# Patient Record
Sex: Female | Born: 1985 | Race: White | Hispanic: No | Marital: Single | Smoking: Never smoker
Health system: Southern US, Community
[De-identification: ages and names within clinical notes are randomized; demographics above are authoritative.]

## PROBLEM LIST (undated history)

## (undated) ENCOUNTER — Inpatient Hospital Stay (HOSPITAL_COMMUNITY): Payer: Self-pay

## (undated) ENCOUNTER — Emergency Department (HOSPITAL_COMMUNITY): Admission: EM | Source: Home / Self Care

## (undated) DIAGNOSIS — F329 Major depressive disorder, single episode, unspecified: Secondary | ICD-10-CM

## (undated) DIAGNOSIS — K37 Unspecified appendicitis: Secondary | ICD-10-CM

## (undated) DIAGNOSIS — H6690 Otitis media, unspecified, unspecified ear: Secondary | ICD-10-CM

## (undated) DIAGNOSIS — F988 Other specified behavioral and emotional disorders with onset usually occurring in childhood and adolescence: Secondary | ICD-10-CM

## (undated) DIAGNOSIS — F32A Depression, unspecified: Secondary | ICD-10-CM

## (undated) DIAGNOSIS — G709 Myoneural disorder, unspecified: Secondary | ICD-10-CM

## (undated) DIAGNOSIS — O139 Gestational [pregnancy-induced] hypertension without significant proteinuria, unspecified trimester: Secondary | ICD-10-CM

## (undated) DIAGNOSIS — F909 Attention-deficit hyperactivity disorder, unspecified type: Secondary | ICD-10-CM

## (undated) DIAGNOSIS — O1493 Unspecified pre-eclampsia, third trimester: Secondary | ICD-10-CM

## (undated) DIAGNOSIS — Z8619 Personal history of other infectious and parasitic diseases: Secondary | ICD-10-CM

## (undated) DIAGNOSIS — H65115 Acute and subacute allergic otitis media (mucoid) (sanguinous) (serous), recurrent, left ear: Secondary | ICD-10-CM

## (undated) DIAGNOSIS — F419 Anxiety disorder, unspecified: Secondary | ICD-10-CM

## (undated) DIAGNOSIS — O365990 Maternal care for other known or suspected poor fetal growth, unspecified trimester, not applicable or unspecified: Secondary | ICD-10-CM

## (undated) HISTORY — DX: Personal history of other infectious and parasitic diseases: Z86.19

## (undated) HISTORY — DX: Major depressive disorder, single episode, unspecified: F32.9

## (undated) HISTORY — DX: Anxiety disorder, unspecified: F41.9

## (undated) HISTORY — PX: TYMPANOPLASTY: SHX33

## (undated) HISTORY — DX: Maternal care for other known or suspected poor fetal growth, unspecified trimester, not applicable or unspecified: O36.5990

## (undated) HISTORY — PX: TONSILLECTOMY: SUR1361

## (undated) HISTORY — DX: Acute and subacute allergic otitis media (mucoid) (sanguinous) (serous), recurrent, left ear: H65.115

## (undated) HISTORY — DX: Otitis media, unspecified, unspecified ear: H66.90

## (undated) HISTORY — DX: Myoneural disorder, unspecified: G70.9

## (undated) HISTORY — PX: WISDOM TOOTH EXTRACTION: SHX21

## (undated) HISTORY — DX: Attention-deficit hyperactivity disorder, unspecified type: F90.9

## (undated) HISTORY — DX: Depression, unspecified: F32.A

## (undated) HISTORY — DX: Unspecified pre-eclampsia, third trimester: O14.93

## (undated) HISTORY — DX: Other specified behavioral and emotional disorders with onset usually occurring in childhood and adolescence: F98.8

## (undated) HISTORY — DX: Unspecified appendicitis: K37

---

## 2006-08-04 ENCOUNTER — Emergency Department: Payer: Self-pay | Admitting: Emergency Medicine

## 2009-02-18 ENCOUNTER — Ambulatory Visit: Payer: Self-pay | Admitting: Unknown Physician Specialty

## 2010-02-13 ENCOUNTER — Emergency Department: Payer: Self-pay | Admitting: Emergency Medicine

## 2010-05-12 ENCOUNTER — Ambulatory Visit: Payer: Self-pay | Admitting: Internal Medicine

## 2011-01-17 ENCOUNTER — Encounter: Payer: Self-pay | Admitting: Internal Medicine

## 2011-01-17 ENCOUNTER — Ambulatory Visit (INDEPENDENT_AMBULATORY_CARE_PROVIDER_SITE_OTHER): Payer: PRIVATE HEALTH INSURANCE | Admitting: Internal Medicine

## 2011-01-17 ENCOUNTER — Ambulatory Visit: Payer: Self-pay | Admitting: Internal Medicine

## 2011-01-17 VITALS — BP 112/76 | HR 86 | Temp 98.1°F | Resp 14 | Ht 67.0 in | Wt 176.0 lb

## 2011-01-17 DIAGNOSIS — IMO0001 Reserved for inherently not codable concepts without codable children: Secondary | ICD-10-CM

## 2011-01-17 DIAGNOSIS — Z309 Encounter for contraceptive management, unspecified: Secondary | ICD-10-CM

## 2011-01-17 DIAGNOSIS — H6690 Otitis media, unspecified, unspecified ear: Secondary | ICD-10-CM

## 2011-01-17 MED ORDER — DROSPIRENONE-ETHINYL ESTRADIOL 3-0.02 MG PO TABS: 1.0000 | ORAL_TABLET | Freq: Every day | ORAL | Status: DC

## 2011-01-17 MED ORDER — LEVONORGESTREL 0.75 MG PO TABS: 0.7500 mg | ORAL_TABLET | Freq: Two times a day (BID) | ORAL | Status: DC

## 2011-01-17 MED ORDER — LEVOFLOXACIN 750 MG PO TABS: 750.0000 mg | ORAL_TABLET | Freq: Every day | ORAL | Status: AC

## 2011-01-17 NOTE — Progress Notes (Signed)
Subjective:    Patient ID: Allison Moreno, female    DOB: [birth year], 25 y.o.   MRN: [id number]  HPI [name] is a 25 year old female who presents for an acute visit complaining of bilateral ear pain. She notes this developed a couple of weeks ago. She was seen at urgent care and started on Augmentin. She completed a ten-day course of Augmentin with no improvement. She denies any fever or chills. She denies any change in hearing. She notes a history of chronic ear infections as a child. She denies any drainage from her ears. She denies any other symptoms such as congestion, sore throat, or cough.  She also reports that she has recently become sexually active with a new partner. She had a sexual encounter after which she realized she had forgotten her birth control pill. She was prescribed plan B. by a local urgent care. She notes that she needs a documented prescription of this for her insurance company. She reports now full compliance with her OCP and is using barrier protection.  Outpatient Encounter Prescriptions as of 01/17/2011  Medication Sig Dispense Refill  . drospirenone-ethinyl estradiol (YAZ) 3-0.02 MG tablet Take 1 tablet by mouth daily.  1 Package  11  . lamoTRIgine (LAMICTAL) 200 MG tablet Take 200 mg by mouth daily.        Marland Kitchen levofloxacin (LEVAQUIN) 750 MG tablet Take 1 tablet (750 mg total) by mouth daily.  7 tablet  0  . levonorgestrel (PLAN B,NEXT CHOICE) 0.75 MG tablet Take 1 tablet (0.75 mg total) by mouth every 12 (twelve) hours.  2 tablet  0     Review of Systems  Constitutional: Negative for fever, chills, appetite change, fatigue and unexpected weight change.  HENT: Positive for ear pain. Negative for hearing loss, congestion, sore throat, trouble swallowing, neck pain, voice change and sinus pressure.   Eyes: Negative for visual disturbance.  Respiratory: Negative for cough, shortness of breath, wheezing and stridor.   Cardiovascular: Negative for chest pain,  palpitations and leg swelling.  Gastrointestinal: Negative for nausea, vomiting, abdominal pain, diarrhea, constipation, blood in stool, abdominal distention and anal bleeding.  Genitourinary: Negative for dysuria and flank pain.  Musculoskeletal: Negative for myalgias, arthralgias and gait problem.  Skin: Negative for color change and rash.  Neurological: Negative for dizziness and headaches.  Hematological: Negative for adenopathy. Does not bruise/bleed easily.  Psychiatric/Behavioral: Negative for suicidal ideas, sleep disturbance and dysphoric mood. The patient is not nervous/anxious.    BP 112/76  Pulse 86  Temp 98.1 F (36.7 C)  Resp 14  Ht 5\' 7"  (1.702 m)  Wt 176 lb (79.833 kg)  BMI 27.57 kg/m2  SpO2 99%  LMP 01/01/2011     Objective:   Physical Exam  Constitutional: She is oriented to person, place, and time. She appears well-developed and well-nourished. No distress.  HENT:  Head: Normocephalic and atraumatic.  Right Ear: External ear normal. Tympanic membrane is retracted. A middle ear effusion is present.  Left Ear: External ear normal. Tympanic membrane is retracted. A middle ear effusion is present.  Ears:  Nose: Nose normal.  Mouth/Throat: Oropharynx is clear and moist. No oropharyngeal exudate or posterior oropharyngeal erythema.  Eyes: Conjunctivae are normal. Pupils are equal, round, and reactive to light. Right eye exhibits no discharge. Left eye exhibits no discharge. No scleral icterus.  Neck: Normal range of motion. Neck supple. No tracheal deviation present. No thyromegaly present.  Cardiovascular: Normal rate, regular rhythm, normal heart sounds and intact distal pulses.  Exam reveals no gallop and no friction rub.   No murmur heard. Pulmonary/Chest: Effort normal and breath sounds normal. No respiratory distress. She has no wheezes. She has no rales. She exhibits no tenderness.  Musculoskeletal: Normal range of motion. She exhibits no edema and no  tenderness.  Lymphadenopathy:    She has no cervical adenopathy.  Neurological: She is alert and oriented to person, place, and time. No cranial nerve deficit. She exhibits normal muscle tone. Coordination normal.  Skin: Skin is warm and dry. No rash noted. She is not diaphoretic. No erythema. No pallor.  Psychiatric: She has a normal mood and affect. Her behavior is normal. Judgment and thought content normal.          Assessment & Plan:  1. Otitis media - patient with left otitis media. She has purulent effusions behind both tympanic membranes. She reports a history of chronic ear infections as a child. She was treated recently with Augmentin. Will try a course of Levaquin. She will followup in one week. If no improvement will set her up with ear nose and throat.  2. Contraception - Will continue Yaz. Rx given for Plan B for pt documentation of use last month.

## 2011-01-25 ENCOUNTER — Ambulatory Visit: Payer: PRIVATE HEALTH INSURANCE | Admitting: Internal Medicine

## 2011-05-16 ENCOUNTER — Other Ambulatory Visit: Payer: Self-pay

## 2011-05-16 LAB — VALPROIC ACID LEVEL: Valproic Acid: 59 ug/mL

## 2012-01-14 ENCOUNTER — Telehealth: Payer: Self-pay | Admitting: Internal Medicine

## 2012-01-14 ENCOUNTER — Emergency Department: Payer: Self-pay | Admitting: Emergency Medicine

## 2012-01-14 LAB — URINALYSIS, COMPLETE
Nitrite: NEGATIVE
Protein: NEGATIVE
Specific Gravity: 1.005 (ref 1.003–1.030)
WBC UR: 179 /HPF (ref 0–5)

## 2012-01-14 NOTE — Telephone Encounter (Signed)
Left message on cell phone voicemail advising patient that she needs to be seen for an acute visit, if symptoms worsen or change over night she should go to acute care or ED.

## 2012-01-14 NOTE — Telephone Encounter (Signed)
Caller: Laporchia/Patient; Patient Name: Allison Moreno; PCP: [facility] (Adults only); Best Callback Phone Number: [phone]; Call regarding Urinary burning, frequency and streaks of blood, onset 10-10. All emergent symptoms ruled out per Bloody urine protocol, see in 24 hrs due to one or more UTI symptoms has not been evaluated.  No appts on 10-15.  Last office visit was 01-2011.  Pt would like script called into  [facility], [phone]. Please follow up with Pt w/ appt or if MD will call RX in.

## 2012-01-15 LAB — URINE CULTURE

## 2012-08-03 ENCOUNTER — Emergency Department: Payer: Self-pay | Admitting: Emergency Medicine

## 2013-02-02 ENCOUNTER — Emergency Department: Payer: Self-pay | Admitting: Emergency Medicine

## 2013-02-02 LAB — COMPREHENSIVE METABOLIC PANEL
Albumin: 4.2 g/dL (ref 3.4–5.0)
Alkaline Phosphatase: 76 U/L (ref 50–136)
BUN: 20 mg/dL — ABNORMAL HIGH (ref 7–18)
Bilirubin,Total: 0.3 mg/dL (ref 0.2–1.0)
Calcium, Total: 9.4 mg/dL (ref 8.5–10.1)
Chloride: 107 mmol/L (ref 98–107)
Co2: 28 mmol/L (ref 21–32)
Glucose: 93 mg/dL (ref 65–99)
Osmolality: 274 (ref 275–301)
Potassium: 3.4 mmol/L — ABNORMAL LOW (ref 3.5–5.1)
Total Protein: 7.8 g/dL (ref 6.4–8.2)

## 2013-02-02 LAB — CBC
MCHC: 33.9 g/dL (ref 32.0–36.0)
MCV: 86 fL (ref 80–100)
Platelet: 159 10*3/uL (ref 150–440)

## 2013-02-02 LAB — DRUG SCREEN, URINE
Amphetamines, Ur Screen: NEGATIVE (ref ?–1000)
Barbiturates, Ur Screen: NEGATIVE (ref ?–200)
Cocaine Metabolite,Ur ~~LOC~~: NEGATIVE (ref ?–300)
MDMA (Ecstasy)Ur Screen: NEGATIVE (ref ?–500)
Methadone, Ur Screen: NEGATIVE (ref ?–300)
Phencyclidine (PCP) Ur S: NEGATIVE (ref ?–25)

## 2013-02-02 LAB — URINALYSIS, COMPLETE
Bilirubin,UR: NEGATIVE
Glucose,UR: NEGATIVE mg/dL (ref 0–75)
Ketone: NEGATIVE
RBC,UR: 4 /HPF (ref 0–5)
Specific Gravity: 1.028 (ref 1.003–1.030)

## 2013-02-02 LAB — ETHANOL
Ethanol %: <0.003 % (ref 0.000–0.080)
Ethanol: <3 mg/dL

## 2013-02-04 LAB — URINE CULTURE

## 2014-03-02 ENCOUNTER — Ambulatory Visit: Payer: Self-pay | Admitting: Family Medicine

## 2014-09-23 ENCOUNTER — Encounter: Payer: Self-pay | Admitting: Obstetrics and Gynecology

## 2014-12-21 ENCOUNTER — Encounter: Payer: Self-pay | Admitting: Family Medicine

## 2014-12-21 ENCOUNTER — Ambulatory Visit (INDEPENDENT_AMBULATORY_CARE_PROVIDER_SITE_OTHER): Payer: 59 | Admitting: Family Medicine

## 2014-12-21 ENCOUNTER — Other Ambulatory Visit: Payer: Self-pay | Admitting: Family Medicine

## 2014-12-21 VITALS — BP 116/82 | HR 88 | Temp 98.0°F | Resp 16 | Wt 199.8 lb

## 2014-12-21 DIAGNOSIS — F988 Other specified behavioral and emotional disorders with onset usually occurring in childhood and adolescence: Secondary | ICD-10-CM | POA: Insufficient documentation

## 2014-12-21 DIAGNOSIS — Z8619 Personal history of other infectious and parasitic diseases: Secondary | ICD-10-CM

## 2014-12-21 DIAGNOSIS — H65115 Acute and subacute allergic otitis media (mucoid) (sanguinous) (serous), recurrent, left ear: Secondary | ICD-10-CM

## 2014-12-21 DIAGNOSIS — F900 Attention-deficit hyperactivity disorder, predominantly inattentive type: Secondary | ICD-10-CM

## 2014-12-21 DIAGNOSIS — F4322 Adjustment disorder with anxiety: Secondary | ICD-10-CM | POA: Insufficient documentation

## 2014-12-21 DIAGNOSIS — Z30013 Encounter for initial prescription of injectable contraceptive: Secondary | ICD-10-CM | POA: Diagnosis not present

## 2014-12-21 HISTORY — DX: Personal history of other infectious and parasitic diseases: Z86.19

## 2014-12-21 HISTORY — DX: Acute and subacute allergic otitis media (mucoid) (sanguinous) (serous), recurrent, left ear: H65.115

## 2014-12-21 MED ORDER — AMPHETAMINE-DEXTROAMPHETAMINE 10 MG PO TABS: 10.0000 mg | ORAL_TABLET | Freq: Two times a day (BID) | ORAL | Status: DC

## 2014-12-21 MED ORDER — AMOXICILLIN-POT CLAVULANATE 875-125 MG PO TABS
1.0000 | ORAL_TABLET | Freq: Two times a day (BID) | ORAL | Status: DC
Start: 2014-12-21 — End: 2015-06-28

## 2014-12-21 MED ORDER — AMPHETAMINE-DEXTROAMPHET ER 10 MG PO CP24: 10.0000 mg | ORAL_CAPSULE | Freq: Every day | ORAL | Status: DC

## 2014-12-21 MED ORDER — MEDROXYPROGESTERONE ACETATE 150 MG/ML IM SUSP: 150.0000 mg | Freq: Once | INTRAMUSCULAR | Status: DC

## 2014-12-21 NOTE — Progress Notes (Deleted)
Name: Allison Moreno   MRN: [id number]    DOB: 1985/10/25   Date:12/21/2014       Progress Note  Subjective  Chief Complaint  Chief Complaint  Patient presents with  . Otitis Media    HPI  Patient is here today with concerns regarding the following symptoms {Symptoms; uri:5322} that started {gen duration 2:310041} ago.  Associated with {SYMPTOMS; CONSTITUTIONAL:22989}. Not associated with ***. Has tried the following home remedies: ***  No problem-specific assessment & plan notes found for this encounter.   Past Medical History  Diagnosis Date  . ADD (attention deficit disorder)   . Depression     Social History  Substance Use Topics  . Smoking status: Never Smoker   . Smokeless tobacco: Not on file  . Alcohol Use: No     Current outpatient prescriptions:  .  amphetamine-dextroamphetamine (ADDERALL) 7.5 MG tablet, Take 7.5 mg by mouth daily., Disp: , Rfl:   Allergies  Allergen Reactions  . Sulfa Drugs Cross Reactors     ROS  Positive for fatigue, nasal congestion, sinus pressure, ear fullness, cough as mentioned in HPI, otherwise all systems reviewed and are negative.  Objective  Filed Vitals:   12/21/14 1558  BP: 116/82  Pulse: 88  Temp: 98 F (36.7 C)  TempSrc: Oral  Resp: 16  Weight: 199 lb 12.8 oz (90.629 kg)  SpO2: 98%   Body mass index is 31.29 kg/(m^2).   Physical Exam  Constitutional: Patient appears well-developed and well-nourished. In no acute distress but does appear to be fatigued from acute illness. HEENT:  - Head: Normocephalic and atraumatic.  - Ears: RIGHT TM bulging with minimal clear exudate, LEFT TM bulging with minimal clear exudate.  - Nose: Nasal mucosa boggy and congested.  - Mouth/Throat: Oropharynx is moist with slight erythema of bilateral tonsils without hypertrophy or exudates. Post nasal drainage present.  - Eyes: Conjunctivae clear, EOM movements normal. PERRLA. No scleral icterus.  Neck: Normal range of motion. Neck  supple. No JVD present. No thyromegaly present. No local lymphadenopathy. Cardiovascular: Regular rate, regular rhythm with no murmurs heard.  Pulmonary/Chest: Effort normal and breath sounds clear in all lung fields.  Musculoskeletal: Normal range of motion bilateral UE and LE, no joint effusions. Skin: Skin is warm and dry. No rash noted. Psychiatric: Patient has a normal mood and affect. Behavior is normal in office today. Judgment and thought content normal in office today.   Assessment & Plan  Etiologies include allergic rhinitis, viral or bacterial infection. Instructed patient on increasing hydration, nasal saline spray, steam inhalation, NSAID if tolerated and not contraindicated. If not already doing so start taking daily anti-histamine and use a steroid nasal spray. If symptoms persist/worsen may consider antibiotic therapy.

## 2014-12-21 NOTE — Progress Notes (Signed)
Name: Allison Moreno   MRN: [id number]    DOB: Mar 08, 1986   Date:12/21/2014       Progress Note  Subjective  Chief Complaint  Chief Complaint  Patient presents with  . Otitis Media  . Contraception  . ADHD    HPI  Allison Moreno is a 29 year old female here today with acute concerns as well as here for chronic medical conditions. Patient presents with left ear pain.  Symptoms include left ear pain. Symptoms began 3 days ago and are gradually worsening since that time. Patient denies nasal congestion and sore throat. Ear history: several previous ear infections. History of right ear surgery due to several infections during child hood. Allison Moreno also reports having had a Mirena IUD placed on 11/29/14 and by 12/11/14 started having headaches, mood swings so she went to the ER and had her IUD removed on 12/14/14.  Symptoms have since resolved. She is now requesting to return to Depo Provera for birth control. Also here for follow up of ADD: The condition is characterized as fidgeting, loses items necessary for activity, interrupting others, poor attention span, shifting from one uncompleted task to another and easily distracted but not excessive talking, difficulty remaining seated, difficulty waiting for her turn, engages in physically dangerous activities, difficulty waiting for her turn, blurting out answers before question is complete, difficulty following instructions or antisocial behavior. There has been no associated hearing difficulties, vision disturbances, chest pain, change in appetite, worsening mood. She currently uses Adderall 7.5 mg po bid and reports no side effects from this medication but feels that the dose may not be enough.   Active Ambulatory Problems    Diagnosis Date Noted  . Adjustment disorder with anxious mood 12/21/2014  . 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 Recurrent subacute  allergic otitis media of left ear 12/21/2014  . Encounter for initial prescription of injectable contraceptive 12/21/2014   Resolved Ambulatory Problems    Diagnosis Date Noted  . No Resolved Ambulatory Problems   Past Medical History  Diagnosis Date  . ADD (attention deficit disorder)   . Depression   . Chronic ear infection     Social History  Substance Use Topics  . Smoking status: Never Smoker   . Smokeless tobacco: Not on file  . Alcohol Use: No    Current outpatient prescriptions:  .  amphetamine-dextroamphetamine (ADDERALL) 7.5 MG tablet, Take 7.5 mg by mouth daily., Disp: , Rfl:   Past Surgical History  Procedure Laterality Date  . Tympanoplasty Right     Family History  Problem Relation Age of Onset  . Depression Mother     Allergies  Allergen Reactions  . Sulfa Drugs Cross Reactors      Review of Systems  CONSTITUTIONAL: No significant weight changes, fever, chills, weakness or fatigue.  HEENT:  - Eyes: No visual changes.  - Ears: No auditory changes. Left ear pain.  - Nose: No sneezing, congestion, runny nose. - Throat: Yes sore throat. No changes in swallowing. SKIN: No rash or itching.  CARDIOVASCULAR: No chest pain, chest pressure or chest discomfort. No palpitations or edema.  RESPIRATORY: No shortness of breath, cough or sputum.  GASTROINTESTINAL: No anorexia, nausea, vomiting. No changes in bowel habits. No abdominal pain or blood.  GENITOURINARY: No dysuria. No frequency. No discharge.  NEUROLOGICAL: No headache, dizziness, syncope, paralysis, ataxia, numbness or tingling in the extremities. No memory changes. No change in  bowel or bladder control.  MUSCULOSKELETAL: No joint pain. No muscle pain. HEMATOLOGIC: No anemia, bleeding or bruising.  LYMPHATICS: No enlarged lymph nodes.  PSYCHIATRIC: No change in mood. No change in sleep pattern.  ENDOCRINOLOGIC: No reports of sweating, cold or heat intolerance. No polyuria or polydipsia.      Objective  BP 116/82 mmHg  Pulse 88  Temp(Src) 98 F (36.7 C) (Oral)  Resp 16  Wt 199 lb 12.8 oz (90.629 kg)  SpO2 98%  LMP 12/16/2014 (Exact Date) Body mass index is 31.29 kg/(m^2).  Physical Exam  Constitutional: Patient appears well-developed and well-nourished. In no distress.  HEENT:  - Head: Normocephalic and atraumatic.  - Ears: Right TM scarred without erythema. Left bulging with clear fluid and prominent red vasculature. - Nose: Nasal mucosa moist, boggy, congested. - Mouth/Throat: Oropharynx is clear and moist. No tonsillar hypertrophy or erythema. No post nasal drainage.  - Eyes: Conjunctivae clear, EOM movements normal. PERRLA. No scleral icterus.  Neck: Normal range of motion. Neck supple. No JVD present. No thyromegaly present.  Cardiovascular: Normal rate, regular rhythm and normal heart sounds.  No murmur heard.  Pulmonary/Chest: Effort normal and breath sounds normal. No respiratory distress. Musculoskeletal: Normal range of motion bilateral UE and LE, no joint effusions. Peripheral vascular: Bilateral LE no edema. Neurological: CN II-XII grossly intact with no focal deficits. Alert and oriented to person, place, and time. Coordination, balance, strength, speech and gait are normal.  Skin: Skin is warm and dry. No rash noted. No erythema.  Psychiatric: Patient has a stable mood and affect. Behavior is normal in office today. Judgment and thought content normal in office today.   Assessment & Plan  1. ADD (attention deficit disorder) without hyperactivity Plan to increase Adderall short acting to 10 mg dose for 1 month then switch to long acting formulation. The patient has been counseled on the proper use, side effects and potential interactions of the new medication. Patient encouraged to review the side effects and safety profile pamphlet provided with the prescription from the pharmacy as well as request counseling from the pharmacy team as needed.    -  amphetamine-dextroamphetamine (ADDERALL) 10 MG tablet; Take 1 tablet (10 mg total) by mouth 2 (two) times daily.  Dispense: 60 tablet; Refill: 0 - amphetamine-dextroamphetamine (ADDERALL XR) 10 MG 24 hr capsule; Take 1 capsule (10 mg total) by mouth daily.  Dispense: 30 capsule; Refill: 0 - amphetamine-dextroamphetamine (ADDERALL XR) 10 MG 24 hr capsule; Take 1 capsule (10 mg total) by mouth daily.  Dispense: 30 capsule; Refill: 0  2. Recurrent subacute allergic otitis media of left ear Acute problem. Treat with Augmentin. May use NSAIDs or tylenol as tolerated for pain and or fever.  - amoxicillin-clavulanate (AUGMENTIN) 875-125 MG per tablet; Take 1 tablet by mouth 2 (two) times daily.  Dispense: 20 tablet; Refill: 0  3. Encounter for initial prescription of injectable contraceptive Patient has been counseled on birth control options today. We discussed risks and benefits of each available contraception. Counseled on risk for STDs not reduced by birth control use. Decided on injectable contraception. The patient has been counseled on the proper usage, side effects and potential interactions of the new medication.  - medroxyPROGESTERone (DEPO-PROVERA) 150 MG/ML injection; Inject 1 mL (150 mg total) into the muscle once.  Dispense: 1 mL; Refill: 3

## 2014-12-29 ENCOUNTER — Telehealth: Payer: Self-pay

## 2014-12-29 NOTE — Telephone Encounter (Signed)
Patient called requesting that we fax proof that she has a rx for Adderall . She is in school and they had to do a drug test.  The requested information was printed and faxed to the attn of Dr. Ardyth Gal at [phone].  Confirmation was received.

## 2015-02-04 ENCOUNTER — Ambulatory Visit: Payer: PRIVATE HEALTH INSURANCE | Admitting: Licensed Clinical Social Worker

## 2015-02-10 ENCOUNTER — Ambulatory Visit (INDEPENDENT_AMBULATORY_CARE_PROVIDER_SITE_OTHER): Payer: 59

## 2015-02-10 DIAGNOSIS — Z3049 Encounter for surveillance of other contraceptives: Secondary | ICD-10-CM

## 2015-02-10 DIAGNOSIS — Z3042 Encounter for surveillance of injectable contraceptive: Secondary | ICD-10-CM

## 2015-02-10 LAB — POCT URINE PREGNANCY: PREG TEST UR: NEGATIVE

## 2015-02-10 MED ORDER — MEDROXYPROGESTERONE ACETATE 150 MG/ML IM SUSP
150.0000 mg | Freq: Once | INTRAMUSCULAR | Status: AC
  Administered 2015-02-10: 150 mg via INTRAMUSCULAR

## 2015-02-15 ENCOUNTER — Ambulatory Visit: Payer: 59 | Admitting: Family Medicine

## 2015-03-01 ENCOUNTER — Ambulatory Visit: Payer: 59 | Admitting: Family Medicine

## 2015-03-22 ENCOUNTER — Ambulatory Visit: Payer: 59 | Admitting: Family Medicine

## 2015-06-28 ENCOUNTER — Encounter: Payer: Self-pay | Admitting: *Deleted

## 2015-06-28 ENCOUNTER — Emergency Department
Admission: EM | Admit: 2015-06-28 | Discharge: 2015-06-28 | Disposition: A | Discharge status: Home / Self Care | Payer: Self-pay | Source: Home / Self Care | Attending: Family Medicine | Admitting: Family Medicine

## 2015-06-28 DIAGNOSIS — J029 Acute pharyngitis, unspecified: Secondary | ICD-10-CM

## 2015-06-28 DIAGNOSIS — J302 Other seasonal allergic rhinitis: Secondary | ICD-10-CM

## 2015-06-28 LAB — POCT RAPID STREP A (OFFICE): Rapid Strep A Screen: NEGATIVE

## 2015-06-28 MED ORDER — MONTELUKAST SODIUM 10 MG PO TABS: 10.0000 mg | ORAL_TABLET | Freq: Every day | ORAL | Status: DC

## 2015-06-28 NOTE — ED Provider Notes (Signed)
CSN: [id number]     Arrival date & time 06/28/15  1913 History   First MD Initiated Contact with Patient 06/28/15 2002     Chief Complaint  Patient presents with  . Sore Throat      HPI Comments: Patient has a history of seasonal allergies and has been coughing for about 1.5 weeks but has not felt ill.  Two days ago she developed a sore throat, fatigue, and increased sinus congestion.  No fevers, chills, and sweats. She has a past history of otitis media and ear tubes.  The history is provided by the patient.    Past Medical History  Diagnosis Date  . ADD (attention deficit disorder)   . Depression   . Chronic ear infection    Past Surgical History  Procedure Laterality Date  . Tympanoplasty Right   . Tonsillectomy    . Wisdom tooth extraction     Family History  Problem Relation Age of Onset  . Depression Mother   . Diabetes Father    Social History  Substance Use Topics  . Smoking status: Never Smoker   . Smokeless tobacco: None  . Alcohol Use: 0.0 oz/week    0 Standard drinks or equivalent per week   OB History    No data available     Review of Systems + sore throat + cough No pleuritic pain No wheezing + nasal congestion + post-nasal drainage No sinus pain/pressure No itchy/red eyes No earache No hemoptysis No SOB No fever/chills No nausea No vomiting No abdominal pain No diarrhea No urinary symptoms No skin rash + fatigue No myalgias No headache Used OTC meds without relief  Allergies  Sulfa drugs cross reactors  Home Medications   Prior to Admission medications   Medication Sig Start Date End Date Taking? Authorizing Provider  amphetamine-dextroamphetamine (ADDERALL) 10 MG tablet Take 1 tablet (10 mg total) by mouth 2 (two) times daily. 12/21/14   Edwena FeltyAshany Sundaram, MD  medroxyPROGESTERone (DEPO-PROVERA) 150 MG/ML injection Inject 1 mL (150 mg total) into the muscle once. 12/21/14   Edwena FeltyAshany Sundaram, MD  montelukast (SINGULAIR) 10 MG tablet  Take 1 tablet (10 mg total) by mouth at bedtime. 06/28/15   Lattie HawStephen A Niccolas Loeper, MD   Meds Ordered and Administered this Visit  Medications - No data to display  BP 132/94 mmHg  Pulse 81  Temp(Src) 98.9 F (37.2 C) (Oral)  Resp 18  Ht 5\' 7"  (1.702 m)  Wt 211 lb (95.709 kg)  BMI 33.04 kg/m2  SpO2 98%  LMP 05/23/2015 No data found.   Physical Exam Nursing notes and Vital Signs reviewed. Appearance:  Patient appears stated age, and in no acute distress Eyes:  Pupils are equal, round, and reactive to light and accomodation.  Extraocular movement is intact.  Conjunctivae are not inflamed  Ears:  Canals normal.  Tympanic membranes normal.  Nose:  Mildly congested turbinates.  No sinus tenderness.    Pharynx:  Normal Neck:  Supple.  Tender enlarged posterior/lateral nodes are palpated bilaterally  Lungs:  Clear to auscultation.  Breath sounds are equal.  Moving air well. Heart:  Regular rate and rhythm without murmurs, rubs, or gallops.  Abdomen:  Nontender without masses or hepatosplenomegaly.  Bowel sounds are present.  No CVA or flank tenderness.  Extremities:  No edema.  Skin:  No rash present.   ED Course  Procedures none    Labs Reviewed  POCT RAPID STREP A (OFFICE) negative     MDM  1. Seasonal allergies   2. Acute pharyngitis, unspecified etiology; suspect early viral URI     Suspect daily cough because of seasonal allergies; begin trial of Singulair. As cold symptoms develop, try the following: Take plain guaifenesin (  extended release tabs such as Mucinex) twice daily, with plenty of water, for cough and congestion.  May add Pseudoephedrine ( , one or two every 4 to 6 hours) for sinus congestion.  Get adequate rest.   May use Afrin nasal spray (or generic oxymetazoline) twice daily for about 5 days and then discontinue.  Also recommend using saline nasal spray several times daily and saline nasal irrigation (AYR is a common brand).  Use Flonase nasal spray each  morning after using Afrin nasal spray and saline nasal irrigation. Try warm salt water gargles for sore throat.  Stop all antihistamines for now, and other non-prescription cough/cold preparations. May take Ibuprofen , 4 tabs every 8 hours with food for chest/sternum discomfort, sore throat, headache, etc. May take Delsym Cough Suppressant at bedtime for nighttime cough.  Follow-up with family doctor if not improving about10 days.     Lattie Haw, MD 07/04/15 [phone]

## 2015-06-28 NOTE — Discharge Instructions (Signed)
As cold symptoms develop, try the following: Take plain guaifenesin (1200mg  extended release tabs such as Mucinex) twice daily, with plenty of water, for cough and congestion.  May add Pseudoephedrine (30mg , one or two every 4 to 6 hours) for sinus congestion.  Get adequate rest.   May use Afrin nasal spray (or generic oxymetazoline) twice daily for about 5 days and then discontinue.  Also recommend using saline nasal spray several times daily and saline nasal irrigation (AYR is a common brand).  Use Flonase nasal spray each morning after using Afrin nasal spray and saline nasal irrigation. Try warm salt water gargles for sore throat.  Stop all antihistamines for now, and other non-prescription cough/cold preparations. May take Ibuprofen 200mg , 4 tabs every 8 hours with food for chest/sternum discomfort, sore throat, headache, etc. May take Delsym Cough Suppressant at bedtime for nighttime cough.  Follow-up with family doctor if not improving about10 days.

## 2015-06-28 NOTE — ED Notes (Signed)
Pt c/o sore throat, "bumps" on throat and nonproductive cough x 2 days. Denies fever.

## 2016-03-22 ENCOUNTER — Emergency Department (HOSPITAL_COMMUNITY)
Admission: EM | Admit: 2016-03-22 | Discharge: 2016-03-22 | Disposition: A | Discharge status: Home / Self Care | Payer: Medicaid Other | Attending: Emergency Medicine | Admitting: Emergency Medicine

## 2016-03-22 ENCOUNTER — Encounter (HOSPITAL_COMMUNITY): Payer: Self-pay | Admitting: Nurse Practitioner

## 2016-03-22 DIAGNOSIS — Z331 Pregnant state, incidental: Secondary | ICD-10-CM | POA: Insufficient documentation

## 2016-03-22 DIAGNOSIS — Z3A01 Less than 8 weeks gestation of pregnancy: Secondary | ICD-10-CM

## 2016-03-22 DIAGNOSIS — R5383 Other fatigue: Secondary | ICD-10-CM | POA: Insufficient documentation

## 2016-03-22 LAB — URINALYSIS, ROUTINE W REFLEX MICROSCOPIC
BILIRUBIN URINE: NEGATIVE
Glucose, UA: NEGATIVE mg/dL
HGB URINE DIPSTICK: NEGATIVE
Ketones, ur: 15 mg/dL — AB
Leukocytes, UA: NEGATIVE
NITRITE: NEGATIVE
PH: 6 (ref 5.0–8.0)
Protein, ur: NEGATIVE mg/dL
SPECIFIC GRAVITY, URINE: 1.025 (ref 1.005–1.030)

## 2016-03-22 LAB — PREGNANCY, URINE: Preg Test, Ur: POSITIVE — AB

## 2016-03-22 NOTE — Discharge Instructions (Signed)
Follow up with GYN as soon as possible.

## 2016-03-22 NOTE — ED Provider Notes (Signed)
[facility]-EMERGENCY DEPT Provider Note   CSN: [id number] Arrival date & time: 03/22/16  1334  By signing my name below, I, Teofilo PodMatthew P. Jamison, attest that this documentation has been prepared under the direction and in the presence of Rolland PorterMark Roxene Alviar, MD . Electronically Signed: Teofilo PodMatthew P. Jamison, ED Scribe. 03/22/2016. 3:56 PM.    History   Chief Complaint Chief Complaint  Patient presents with  . Fatigue    The history is provided by the patient. No language interpreter was used.   HPI Comments:  Katherina MiresCrystal G Cunnington is a 30 y.o. female who presents to the Emergency Department complaining of general fatigue for 3 days. Pt complains of associated fever, chills, right flank pain, cough, rhinorrhea, diarrhea. Pt states that the flank pain is intermittent and has been present for 2 weeks. 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 LNMP was 02/13/16, has never pregnant, and she does not take birth contol. No alleviating factors noted. Pt denies urinary frequency, dysuria.   Past Medical History:  Diagnosis Date  . ADD (attention deficit disorder)   . Chronic ear infection   . Depression     Patient Active Problem List   Diagnosis Date Noted  . Adjustment disorder with anxious mood 12/21/2014  . 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 Encounter for initial prescription of injectable contraceptive 12/21/2014    Past Surgical History:  Procedure Laterality Date  . TONSILLECTOMY    . TYMPANOPLASTY Right   . WISDOM TOOTH EXTRACTION      OB History    No data available       Home Medications    Prior to Admission medications   Medication Sig Start Date End Date Taking? Authorizing Provider  amphetamine-dextroamphetamine (ADDERALL) 10 MG tablet Take 1 tablet (10 mg total) by mouth 2 (two) times daily.  12/21/14   Edwena FeltyAshany Sundaram, MD  medroxyPROGESTERone (DEPO-PROVERA) 150 MG/ML injection Inject 1 mL (150 mg total) into the muscle once. 12/21/14   Edwena FeltyAshany Sundaram, MD  montelukast (SINGULAIR) 10 MG tablet Take 1 tablet (10 mg total) by mouth at bedtime. 06/28/15   Lattie HawStephen A Beese, MD    Family History Family History  Problem Relation Age of Onset  . Depression Mother   . Diabetes Father     Social History Social History  Substance Use Topics  . Smoking status: Never Smoker  . Smokeless tobacco: Not on file  . Alcohol use 0.0 oz/week     Allergies   Sulfa drugs cross reactors   Review of Systems Review of Systems  Constitutional: Positive for chills, fatigue and fever. Negative for appetite change and diaphoresis.  HENT: Positive for rhinorrhea. Negative for mouth sores, sore throat and trouble swallowing.   Eyes: Negative for visual disturbance.  Respiratory: Positive for cough. Negative for chest tightness, shortness of breath and wheezing.   Cardiovascular: Negative for chest pain.  Gastrointestinal: Positive for diarrhea. Negative for abdominal distention, abdominal pain, nausea and vomiting.  Endocrine: Negative for polydipsia, polyphagia and polyuria.  Genitourinary: Positive for flank pain. Negative for dysuria, frequency and hematuria.  Musculoskeletal: Negative for gait problem.  Skin: Negative for color change, pallor and rash.  Neurological: Negative for dizziness, syncope, light-headedness and headaches.  Hematological: Does not bruise/bleed easily.  Psychiatric/Behavioral: Negative for behavioral problems and confusion.     Physical Exam Updated Vital  Signs BP 133/93 (BP Location: Left Arm)   Pulse 101   Temp 98.5 F (36.9 C) (Oral)   Resp 14   LMP 02/13/2016   SpO2 99%   Physical Exam  Constitutional: She is oriented to person, place, and time. She appears well-developed and well-nourished. No distress.  HENT:  Head: Normocephalic.  Eyes:  Conjunctivae are normal. Pupils are equal, round, and reactive to light. No scleral icterus.  Neck: Normal range of motion. Neck supple. No thyromegaly present.  Cardiovascular: Normal rate and regular rhythm.  Exam reveals no gallop and no friction rub.   No murmur heard. Pulmonary/Chest: Effort normal and breath sounds normal. No respiratory distress. She has no wheezes. She has no rales.  Abdominal: Soft. Bowel sounds are normal. She exhibits no distension. There is no tenderness. There is no rebound.  Indicates RUQ but no tenderness.  Musculoskeletal: Normal range of motion.  Neurological: She is alert and oriented to person, place, and time.  Skin: Skin is warm and dry. No rash noted.  Psychiatric: She has a normal mood and affect. Her behavior is normal.     ED Treatments / Results  DIAGNOSTIC STUDIES:  Oxygen Saturation is 99% on RA, normal by my interpretation.    COORDINATION OF CARE:  3:56 PM Discussed treatment plan with pt at bedside and pt agreed to plan.   Labs (all labs ordered are listed, but only abnormal results are displayed) Labs Reviewed - No data to display  EKG  EKG Interpretation None       Radiology No results found.  Procedures Procedures (including critical care time)  Medications Ordered in ED Medications - No data to display   Initial Impression / Assessment and Plan / ED Course  I have reviewed the triage vital signs and the nursing notes.  Pertinent labs & imaging results that were available during my care of the patient were reviewed by me and considered in my medical decision making (see chart for details).  Clinical Course     Patient made aware positive brings. She's had no pain. She's having no nausea. She's had no bleeding. We discussed "acting pregnant" including avoiding alcohol, tobacco, anti-inflammatories. Taking a prenatal vitamin and started prenatal care with an OB/GYN as soon as possible.  Final Clinical  Impressions(s) / ED Diagnoses   Final diagnoses:  None    New Prescriptions New Prescriptions   No medications on file      Rolland PorterMark Cesar Alf, MD 03/22/16 1700

## 2016-03-22 NOTE — ED Triage Notes (Signed)
Pt presents with c/o malaise. The malaise began 2 days ago. She reports chills, body aches, anorexia,  rhinorrhea, dry cough. She tried otc cold meds with no relief of symptoms

## 2016-03-25 ENCOUNTER — Inpatient Hospital Stay (HOSPITAL_COMMUNITY)
Admission: AD | Admit: 2016-03-25 | Discharge: 2016-03-25 | Disposition: A | Discharge status: Home / Self Care | Payer: Medicaid Other | Source: Ambulatory Visit | Attending: Obstetrics and Gynecology | Admitting: Obstetrics and Gynecology

## 2016-03-25 ENCOUNTER — Encounter (HOSPITAL_COMMUNITY): Payer: Self-pay

## 2016-03-25 ENCOUNTER — Inpatient Hospital Stay (HOSPITAL_COMMUNITY): Payer: Medicaid Other

## 2016-03-25 DIAGNOSIS — O99341 Other mental disorders complicating pregnancy, first trimester: Secondary | ICD-10-CM | POA: Insufficient documentation

## 2016-03-25 DIAGNOSIS — O26891 Other specified pregnancy related conditions, first trimester: Secondary | ICD-10-CM | POA: Diagnosis not present

## 2016-03-25 DIAGNOSIS — F988 Other specified behavioral and emotional disorders with onset usually occurring in childhood and adolescence: Secondary | ICD-10-CM | POA: Diagnosis not present

## 2016-03-25 DIAGNOSIS — R109 Unspecified abdominal pain: Secondary | ICD-10-CM | POA: Diagnosis present

## 2016-03-25 DIAGNOSIS — O209 Hemorrhage in early pregnancy, unspecified: Secondary | ICD-10-CM | POA: Diagnosis not present

## 2016-03-25 DIAGNOSIS — O4690 Antepartum hemorrhage, unspecified, unspecified trimester: Secondary | ICD-10-CM | POA: Insufficient documentation

## 2016-03-25 DIAGNOSIS — F329 Major depressive disorder, single episode, unspecified: Secondary | ICD-10-CM | POA: Insufficient documentation

## 2016-03-25 DIAGNOSIS — N939 Abnormal uterine and vaginal bleeding, unspecified: Secondary | ICD-10-CM

## 2016-03-25 LAB — URINALYSIS, ROUTINE W REFLEX MICROSCOPIC
BILIRUBIN URINE: NEGATIVE
Glucose, UA: NEGATIVE mg/dL
HGB URINE DIPSTICK: NEGATIVE
Ketones, ur: NEGATIVE mg/dL
Leukocytes, UA: NEGATIVE
Nitrite: NEGATIVE
PH: 7 (ref 5.0–8.0)
Protein, ur: NEGATIVE mg/dL
SPECIFIC GRAVITY, URINE: 1.02 (ref 1.005–1.030)

## 2016-03-25 LAB — WET PREP, GENITAL
Clue Cells Wet Prep HPF POC: NONE SEEN
SPERM: NONE SEEN
TRICH WET PREP: NONE SEEN
Yeast Wet Prep HPF POC: NONE SEEN

## 2016-03-25 LAB — CBC
HEMATOCRIT: 37.9 % (ref 36.0–46.0)
Hemoglobin: 12.9 g/dL (ref 12.0–15.0)
MCH: 29.4 pg (ref 26.0–34.0)
MCHC: 34 g/dL (ref 30.0–36.0)
MCV: 86.3 fL (ref 78.0–100.0)
PLATELETS: 192 10*3/uL (ref 150–400)
RBC: 4.39 MIL/uL (ref 3.87–5.11)
RDW: 12.8 % (ref 11.5–15.5)
WBC: 5.9 10*3/uL (ref 4.0–10.5)

## 2016-03-25 LAB — HCG, QUANTITATIVE, PREGNANCY: hCG, Beta Chain, Quant, S: 8442 m[IU]/mL — ABNORMAL HIGH (ref <5)

## 2016-03-25 NOTE — MAU Provider Note (Signed)
History   Patient Allison Moreno is a 30 year old G1P0 by uncertain LMP here with complaints of abdominal cramping since yesterday and spotting today. She recently found out she was pregnant and does not have any prenatal care yet.   CSN: [id number]  Arrival date and time: 03/25/16 2035   None     Chief Complaint  Patient presents with  . Abdominal Cramping  . Vaginal Bleeding   Abdominal Cramping  This is a new problem. The current episode started today. The onset quality is sudden. The problem has been unchanged. The pain is located in the suprapubic region. The pain is at a severity of 4/10. The quality of the pain is cramping. The abdominal pain does not radiate. Pertinent negatives include no anorexia, arthralgias, belching, constipation, diarrhea, fever, flatus, frequency, headaches, hematochezia, hematuria, melena, myalgias, nausea, vomiting or weight loss. The pain is aggravated by certain positions. The pain is relieved by nothing. The treatment provided no relief. There is no history of abdominal surgery.  Vaginal Bleeding  The patient's primary symptoms include vaginal bleeding. The patient's pertinent negatives include no genital itching, genital lesions, genital odor, genital rash, missed menses, pelvic pain or vaginal discharge. The current episode started today. The problem occurs intermittently. The problem has been unchanged. Associated symptoms include abdominal pain. Pertinent negatives include no anorexia, constipation, diarrhea, fever, frequency, headaches, hematuria, nausea or vomiting. The vaginal discharge was normal. The vaginal bleeding is spotting. She has not been passing clots. She has not been passing tissue. Nothing aggravates the symptoms. She has tried nothing for the symptoms. There is no history of an abdominal surgery, a Cesarean section, an ectopic pregnancy, endometriosis, a gynecological surgery, herpes simplex, menorrhagia, metrorrhagia, miscarriage,  ovarian cysts, perineal abscess, PID, an STD, a terminated pregnancy or vaginosis.  Abdominal Pain  Pertinent negatives include no anorexia, arthralgias, belching, constipation, diarrhea, fever, flatus, frequency, headaches, hematochezia, hematuria, melena, myalgias, nausea, vomiting or weight loss. There is no history of abdominal surgery.    OB History    Gravida Para Term Preterm AB Living   1             SAB TAB Ectopic Multiple Live Births                  Past Medical History:  Diagnosis Date  . ADD (attention deficit disorder)   . Chronic ear infection   . Depression     Past Surgical History:  Procedure Laterality Date  . TONSILLECTOMY    . TYMPANOPLASTY Right   . WISDOM TOOTH EXTRACTION      Family History  Problem Relation Age of Onset  . Depression Mother   . Diabetes Father     Social History  Substance Use Topics  . Smoking status: Never Smoker  . Smokeless tobacco: Never Used  . Alcohol use 0.0 oz/week    Allergies:  Allergies  Allergen Reactions  . Sulfa Drugs Cross Reactors     Prescriptions Prior to Admission  Medication Sig Dispense Refill Last Dose  . amphetamine-dextroamphetamine (ADDERALL) 10 MG tablet Take 1 tablet (10 mg total) by mouth 2 (two) times daily. 60 tablet 0   . medroxyPROGESTERone (DEPO-PROVERA) 150 MG/ML injection Inject 1 mL (150 mg total) into the muscle once. 1 mL 3   . montelukast (SINGULAIR) 10 MG tablet Take 1 tablet (10 mg total) by mouth at bedtime. 30 tablet 1     Review of Systems  Constitutional: Negative for fever and weight loss.  HENT: Negative.   Eyes: Negative.   Respiratory: Negative.   Cardiovascular: Negative.   Gastrointestinal: Positive for abdominal pain. Negative for anorexia, constipation, diarrhea, flatus, hematochezia, melena, nausea and vomiting.  Genitourinary: Positive for vaginal bleeding. Negative for frequency, hematuria, menorrhagia, missed menses, pelvic pain and vaginal discharge.   Musculoskeletal: Negative for arthralgias and myalgias.  Skin: Negative.   Neurological: Negative for headaches.  Psychiatric/Behavioral: Negative.    Physical Exam   Blood pressure 131/87, pulse 88, temperature 97.8 F (36.6 C), temperature source Oral, resp. rate 16, last menstrual period 02/13/2016.  Physical Exam  Constitutional: She is oriented to person, place, and time. She appears well-developed.  HENT:  Head: Normocephalic.  Neck: Normal range of motion.  Respiratory: Effort normal. No respiratory distress. She has no wheezes. She has no rales. She exhibits no tenderness.  GI: Soft. She exhibits no distension and no mass. There is no tenderness. There is no rebound and no guarding.  Genitourinary: Vagina normal and uterus normal. No vaginal discharge found.  Genitourinary Comments: External genitalia and vaginal walls normal appearing; pink with no discharge or lesions. No blood visualized in the vagina; cervix is pink and non-erythematous nor friable. No CMT.   Musculoskeletal: Normal range of motion.  Neurological: She is alert and oriented to person, place, and time.  Skin: Skin is warm and dry.    MAU Course  Procedures  MDM UA/wet prep: normal GC CT pending Lab work: CBC ABO: O pos Beta hcg: 8444 US: IUP with yolk sac at 5 weeks and 5 days. No subchorionic hemorrhage visualized.    Assessment and Plan   1. Vaginal bleeding in pregnancy, first trimester   2. Vaginal bleeding    1. SIUP with yolk sac at 5 weeks and 5 days.  2. Patient to be discharged with pregnancy medicaid letter and instructions to establish prenatal care with a health care provider 3. Reviewed when to return to MAU (worsening bleedng, cramping that is not relieved by comfort measures, passing clots or tissue)   Charlesetta GaribaldiKathryn Lorraine Kooistra CNM 03/25/2016, 9:22 PM

## 2016-03-25 NOTE — Discharge Instructions (Signed)
First Trimester of Pregnancy  The first trimester of pregnancy is from week 1 until the end of week 12 (months 1 through 3). A week after a sperm fertilizes an egg, the egg will implant on the wall of the uterus. This embryo will begin to develop into a baby. Genes from you and your partner are forming the baby. The female genes determine whether the baby is a boy or a girl. At 6-8 weeks, the eyes and face are formed, and the heartbeat can be seen on ultrasound. At the end of 12 weeks, all the baby's organs are formed.   Now that you are pregnant, you will want to do everything you can to have a healthy baby. Two of the most important things are to get good prenatal care and to follow your health care provider's instructions. Prenatal care is all the medical care you receive before the baby's birth. This care will help prevent, find, and treat any problems during the pregnancy and childbirth.  BODY CHANGES  Your body goes through many changes during pregnancy. The changes vary from woman to woman.   · You may gain or lose a couple of pounds at first.  · You may feel sick to your stomach (nauseous) and throw up (vomit). If the vomiting is uncontrollable, call your health care provider.  · You may tire easily.  · You may develop headaches that can be relieved by medicines approved by your health care provider.  · You may urinate more often. Painful urination may mean you have a bladder infection.  · You may develop heartburn as a result of your pregnancy.  · You may develop constipation because certain hormones are causing the muscles that push waste through your intestines to slow down.  · You may develop hemorrhoids or swollen, bulging veins (varicose veins).  · Your breasts may begin to grow larger and become tender. Your nipples may stick out more, and the tissue that surrounds them (areola) may become darker.  · Your gums may bleed and may be sensitive to brushing and flossing.   · Dark spots or blotches (chloasma, mask of pregnancy) may develop on your face. This will likely fade after the baby is born.  · Your menstrual periods will stop.  · You may have a loss of appetite.  · You may develop cravings for certain kinds of food.  · You may have changes in your emotions from day to day, such as being excited to be pregnant or being concerned that something may go wrong with the pregnancy and baby.  · You may have more vivid and strange dreams.  · You may have changes in your hair. These can include thickening of your hair, rapid growth, and changes in texture. Some women also have hair loss during or after pregnancy, or hair that feels dry or thin. Your hair will most likely return to normal after your baby is born.  WHAT TO EXPECT AT YOUR PRENATAL VISITS  During a routine prenatal visit:  · You will be weighed to make sure you and the baby are growing normally.  · Your blood pressure will be taken.  · Your abdomen will be measured to track your baby's growth.  · The fetal heartbeat will be listened to starting around week 10 or 12 of your pregnancy.  · Test results from any previous visits will be discussed.  Your health care provider may ask you:  · How you are feeling.  · If you   are feeling the baby move.  · If you have had any abnormal symptoms, such as leaking fluid, bleeding, severe headaches, or abdominal cramping.  · If you are using any tobacco products, including cigarettes, chewing tobacco, and electronic cigarettes.  · If you have any questions.  Other tests that may be performed during your first trimester include:  · Blood tests to find your blood type and to check for the presence of any previous infections. They will also be used to check for low iron levels (anemia) and Rh antibodies. Later in the pregnancy, blood tests for diabetes will be done along with other tests if problems develop.  · Urine tests to check for infections, diabetes, or protein in the urine.   · An ultrasound to confirm the proper growth and development of the baby.  · An amniocentesis to check for possible genetic problems.  · Fetal screens for spina bifida and Down syndrome.  · You may need other tests to make sure you and the baby are doing well.  · HIV (human immunodeficiency virus) testing. Routine prenatal testing includes screening for HIV, unless you choose not to have this test.  HOME CARE INSTRUCTIONS   Medicines  · Follow your health care provider's instructions regarding medicine use. Specific medicines may be either safe or unsafe to take during pregnancy.  · Take your prenatal vitamins as directed.  · If you develop constipation, try taking a stool softener if your health care provider approves.  Diet  · Eat regular, well-balanced meals. Choose a variety of foods, such as meat or vegetable-based protein, fish, milk and low-fat dairy products, vegetables, fruits, and whole grain breads and cereals. Your health care provider will help you determine the amount of weight gain that is right for you.  · Avoid raw meat and uncooked cheese. These carry germs that can cause birth defects in the baby.  · Eating four or five small meals rather than three large meals a day may help relieve nausea and vomiting. If you start to feel nauseous, eating a few soda crackers can be helpful. Drinking liquids between meals instead of during meals also seems to help nausea and vomiting.  · If you develop constipation, eat more high-fiber foods, such as fresh vegetables or fruit and whole grains. Drink enough fluids to keep your urine clear or pale yellow.  Activity and Exercise  · Exercise only as directed by your health care provider. Exercising will help you:    Control your weight.    Stay in shape.    Be prepared for labor and delivery.  · Experiencing pain or cramping in the lower abdomen or low back is a good sign that you should stop exercising. Check with your health care provider  before continuing normal exercises.  · Try to avoid standing for long periods of time. Move your legs often if you must stand in one place for a long time.  · Avoid heavy lifting.  · Wear low-heeled shoes, and practice good posture.  · You may continue to have sex unless your health care provider directs you otherwise.  Relief of Pain or Discomfort  · Wear a good support bra for breast tenderness.    · Take warm sitz baths to soothe any pain or discomfort caused by hemorrhoids. Use hemorrhoid cream if your health care provider approves.    · Rest with your legs elevated if you have leg cramps or low back pain.  · If you develop varicose veins in your   legs, wear support hose. Elevate your feet for 15 minutes, 3-4 times a day. Limit salt in your diet.  Prenatal Care  · Schedule your prenatal visits by the twelfth week of pregnancy. They are usually scheduled monthly at first, then more often in the last 2 months before delivery.  · Write down your questions. Take them to your prenatal visits.  · Keep all your prenatal visits as directed by your health care provider.  Safety  · Wear your seat belt at all times when driving.  · Make a list of emergency phone numbers, including numbers for family, friends, the hospital, and police and fire departments.  General Tips  · Ask your health care provider for a referral to a local prenatal education class. Begin classes no later than at the beginning of month 6 of your pregnancy.  · Ask for help if you have counseling or nutritional needs during pregnancy. Your health care provider can offer advice or refer you to specialists for help with various needs.  · Do not use hot tubs, steam rooms, or saunas.  · Do not douche or use tampons or scented sanitary pads.  · Do not cross your legs for long periods of time.  · Avoid cat litter boxes and soil used by cats. These carry germs that can cause birth defects in the baby and possibly loss of the fetus by miscarriage or stillbirth.   · Avoid all smoking, herbs, alcohol, and medicines not prescribed by your health care provider. Chemicals in these affect the formation and growth of the baby.  · Do not use any tobacco products, including cigarettes, chewing tobacco, and electronic cigarettes. If you need help quitting, ask your health care provider. You may receive counseling support and other resources to help you quit.  · Schedule a dentist appointment. At home, brush your teeth with a soft toothbrush and be gentle when you floss.  SEEK MEDICAL CARE IF:   · You have dizziness.  · You have mild pelvic cramps, pelvic pressure, or nagging pain in the abdominal area.  · You have persistent nausea, vomiting, or diarrhea.  · You have a bad smelling vaginal discharge.  · You have pain with urination.  · You notice increased swelling in your face, hands, legs, or ankles.  SEEK IMMEDIATE MEDICAL CARE IF:   · You have a fever.  · You are leaking fluid from your vagina.  · You have spotting or bleeding from your vagina.  · You have severe abdominal cramping or pain.  · You have rapid weight gain or loss.  · You vomit blood or material that looks like coffee grounds.  · You are exposed to German measles and have never had them.  · You are exposed to fifth disease or chickenpox.  · You develop a severe headache.  · You have shortness of breath.  · You have any kind of trauma, such as from a fall or a car accident.     This information is not intended to replace advice given to you by your health care provider. Make sure you discuss any questions you have with your health care provider.     Document Released: 03/13/2001 Document Revised: 04/09/2014 Document Reviewed: 01/27/2013  [employer]vier Interactive Patient Education ©2017 [employer]

## 2016-03-25 NOTE — MAU Note (Signed)
Pt [facility] Started having some constant cramping last night-rates 4/10. Began having some light spotting today. LMP: 02/12/2016 was only 2 days and lighter than normal and typically has periods that are 4-5 days.

## 2016-03-26 LAB — ABO/RH: ABO/RH(D): O POS

## 2016-03-27 LAB — GC/CHLAMYDIA PROBE AMP (~~LOC~~) NOT AT ARMC
Chlamydia: NEGATIVE
NEISSERIA GONORRHEA: NEGATIVE

## 2016-04-26 ENCOUNTER — Encounter: Payer: Medicaid Other | Admitting: Obstetrics and Gynecology

## 2016-05-03 ENCOUNTER — Other Ambulatory Visit (HOSPITAL_COMMUNITY)
Admission: RE | Admit: 2016-05-03 | Discharge: 2016-05-03 | Disposition: A | Discharge status: Home / Self Care | Payer: Medicaid Other | Source: Ambulatory Visit | Attending: Student | Admitting: Student

## 2016-05-03 ENCOUNTER — Encounter: Payer: Self-pay | Admitting: Student

## 2016-05-03 ENCOUNTER — Ambulatory Visit (INDEPENDENT_AMBULATORY_CARE_PROVIDER_SITE_OTHER): Payer: Medicaid Other | Admitting: Student

## 2016-05-03 VITALS — BP 134/88 | HR 87 | Wt 218.0 lb

## 2016-05-03 DIAGNOSIS — Z1151 Encounter for screening for human papillomavirus (HPV): Secondary | ICD-10-CM | POA: Insufficient documentation

## 2016-05-03 DIAGNOSIS — Z3491 Encounter for supervision of normal pregnancy, unspecified, first trimester: Secondary | ICD-10-CM | POA: Diagnosis not present

## 2016-05-03 DIAGNOSIS — Z283 Underimmunization status: Secondary | ICD-10-CM

## 2016-05-03 DIAGNOSIS — O09899 Supervision of other high risk pregnancies, unspecified trimester: Secondary | ICD-10-CM

## 2016-05-03 DIAGNOSIS — Z23 Encounter for immunization: Secondary | ICD-10-CM

## 2016-05-03 DIAGNOSIS — Z113 Encounter for screening for infections with a predominantly sexual mode of transmission: Secondary | ICD-10-CM | POA: Diagnosis present

## 2016-05-03 DIAGNOSIS — O9989 Other specified diseases and conditions complicating pregnancy, childbirth and the puerperium: Secondary | ICD-10-CM

## 2016-05-03 DIAGNOSIS — Z0282 Encounter for adoption services: Secondary | ICD-10-CM

## 2016-05-03 DIAGNOSIS — Z3490 Encounter for supervision of normal pregnancy, unspecified, unspecified trimester: Secondary | ICD-10-CM

## 2016-05-03 DIAGNOSIS — Z3689 Encounter for other specified antenatal screening: Secondary | ICD-10-CM | POA: Diagnosis not present

## 2016-05-03 DIAGNOSIS — Z01419 Encounter for gynecological examination (general) (routine) without abnormal findings: Secondary | ICD-10-CM | POA: Insufficient documentation

## 2016-05-03 DIAGNOSIS — O0990 Supervision of high risk pregnancy, unspecified, unspecified trimester: Secondary | ICD-10-CM | POA: Insufficient documentation

## 2016-05-03 NOTE — Progress Notes (Addendum)
  Subjective:    Allison Moreno is at 31 year old G1P0 being seen today for her first obstetrical visit.  This is not a planned pregnancy. She is at 9565w3d gestation. Her obstetrical history is significant for nothing. . Relationship with FOB: not involved. Patient does intend to breast feed. Pregnancy history fully reviewed.  Patient reports no complaints.  Review of Systems:   Review of Systems  Constitutional: Negative.   HENT: Negative.   Eyes: Negative.   Respiratory: Negative.   Cardiovascular: Negative.   Gastrointestinal: Negative.   Endocrine: Negative.   Genitourinary: Negative.   Musculoskeletal: Negative.   Allergic/Immunologic: Negative.   Neurological: Negative.   Hematological: Negative.   Psychiatric/Behavioral: Negative.     Objective:     BP 134/88   Pulse 87   Wt 218 lb (98.9 kg)   LMP 02/13/2016   BMI 34.14 kg/m  Physical Exam  Constitutional: She is oriented to person, place, and time. She appears well-developed.  HENT:  Head: Normocephalic.  Eyes: Pupils are equal, round, and reactive to light.  Cardiovascular: Normal rate and regular rhythm.   Respiratory: Breath sounds normal. No respiratory distress. She has no wheezes. She has no rales. She exhibits no tenderness.  GI: Soft. She exhibits no distension. There is no tenderness. There is no rebound and no guarding.  Genitourinary: Vagina normal. No vaginal discharge found.  Genitourinary Comments: NEFG; vaginal walls pink with no lesions. Scant milky white discharge in the vagina, cervix is pink with no lesions. No CMT.   Musculoskeletal: Normal range of motion.  Neurological: She is alert and oriented to person, place, and time.  Skin: Skin is warm and dry.  Psychiatric: She has a normal mood and affect.    Maternal Exam:  Introitus: Vagina is negative for discharge.       Assessment:    Pregnancy: G1P0 Patient Active Problem List   Diagnosis Date Noted  . Supervision of normal  pregnancy, antepartum 05/03/2016  . Adopted 05/03/2016  . Adjustment disorder with anxious mood 12/21/2014  . 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 Encounter for initial prescription of injectable contraceptive 12/21/2014       Plan:     Initial labs drawn. Prenatal vitamins. Problem list reviewed and updated. AFP3 discussed: declined. Role of ultrasound in pregnancy discussed; fetal survey: requested. Amniocentesis discussed: not indicated. Follow up in 6 weeks. 50% of 30 min visit spent on counseling and coordination of care.  Patient enrolled in [facility]  Allison Moreno CNM 05/03/2016

## 2016-05-03 NOTE — Patient Instructions (Signed)
How a Baby Grows During Pregnancy Introduction Pregnancy begins when a female's sperm enters a female's egg (fertilization). This happens in one of the tubes (fallopian tubes) that connect the ovaries to the womb (uterus). The fertilized egg is called an embryo until it reaches 10 weeks. From 10 weeks until birth, it is called a fetus. The fertilized egg moves down the fallopian tube to the uterus. Then it implants into the lining of the uterus and begins to grow. The developing fetus receives oxygen and nutrients through the pregnant woman's bloodstream and the tissues that grow (placenta) to support the fetus. The placenta is the life support system for the fetus. It provides nutrition and removes waste. Learning as much as you can about your pregnancy and how your baby is developing can help you enjoy the experience. It can also make you aware of when there might be a problem and when to ask questions. How long does a typical pregnancy last? A pregnancy usually lasts 280 days, or about 40 weeks. Pregnancy is divided into three trimesters:  First trimester: 0-13 weeks.  Second trimester: 14-27 weeks.  Third trimester: 28-40 weeks. The day when your baby is considered ready to be born (full term) is your estimated date of delivery. How does my baby develop month by month? First month  The fertilized egg attaches to the inside of the uterus.  Some cells will form the placenta. Others will form the fetus.  The arms, legs, brain, spinal cord, lungs, and heart begin to develop.  At the end of the first month, the heart begins to beat. Second month  The bones, inner ear, eyelids, hands, and feet form.  The genitals develop.  By the end of 8 weeks, all major organs are developing. Third month  All of the internal organs are forming.  Teeth develop below the gums.  Bones and muscles begin to grow. The spine can flex.  The skin is transparent.  Fingernails and toenails begin to  form.  Arms and legs continue to grow longer, and hands and feet develop.  The fetus is about 3 in (7.6 cm) long. Fourth month  The placenta is completely formed.  The external sex organs, neck, outer ear, eyebrows, eyelids, and fingernails are formed.  The fetus can hear, swallow, and move its arms and legs.  The kidneys begin to produce urine.  The skin is covered with a white waxy coating (vernix) and very fine hair (lanugo). Fifth month  The fetus moves around more and can be felt for the first time (quickening).  The fetus starts to sleep and wake up and may begin to suck its finger.  The nails grow to the end of the fingers.  The organ in the digestive system that makes bile (gallbladder) functions and helps to digest the nutrients.  If your baby is a girl, eggs are present in her ovaries. If your baby is a boy, testicles start to move down into his scrotum. Sixth month  The lungs are formed, but the fetus is not yet able to breathe.  The eyes open. The brain continues to develop.  Your baby has fingerprints and toe prints. Your baby's hair grows thicker.  At the end of the second trimester, the fetus is about 9 in (22.9 cm) long. Seventh month  The fetus kicks and stretches.  The eyes are developed enough to sense changes in light.  The hands can make a grasping motion.  The fetus responds to sound. Eighth month    All organs and body systems are fully developed and functioning.  Bones harden and taste buds develop. The fetus may hiccup.  Certain areas of the brain are still developing. The skull remains soft. Ninth month  The fetus gains about  lb (0.23 kg) each week.  The lungs are fully developed.  Patterns of sleep develop.  The fetus's head typically moves into a head-down position (vertex) in the uterus to prepare for birth. If the buttocks move into a vertex position instead, the baby is breech.  The fetus weighs 6-9 lbs (2.72-4.08 kg) and is  19-20 in (48.26-50.8 cm) long. What can I do to have a healthy pregnancy and help my baby develop?  Eating and Drinking  Eat a healthy diet.  Talk with your health care provider to make sure that you are getting the nutrients that you and your baby need.  Visit www.choosemyplate.gov to learn about creating a healthy diet.  Gain a healthy amount of weight during pregnancy as advised by your health care provider. This is usually 25-35 pounds. You may need to:  Gain more if you were underweight before getting pregnant or if you are pregnant with more than one baby.  Gain less if you were overweight or obese when you got pregnant. Medicines and Vitamins  Take prenatal vitamins as directed by your health care provider. These include vitamins such as folic acid, iron, calcium, and vitamin D. They are important for healthy development.  Take medicines only as directed by your health care provider. Read labels and ask a pharmacist or your health care provider whether over-the-counter medicines, supplements, and prescription drugs are safe to take during pregnancy. Activities  Be physically active as advised by your health care provider. Ask your health care provider to recommend activities that are safe for you to do, such as walking or swimming.  Do not participate in strenuous or extreme sports.  Lifestyle  Do not drink alcohol.  Do not use any tobacco products, including cigarettes, chewing tobacco, or electronic cigarettes. If you need help quitting, ask your health care provider.  Do not use illegal drugs. Safety  Avoid exposure to mercury, lead, or other heavy metals. Ask your health care provider about common sources of these heavy metals.  Avoid listeria infection during pregnancy. Follow these precautions:  Do not eat soft cheeses or deli meats.  Do not eat hot dogs unless they have been warmed up to the point of steaming, such as in the microwave oven.  Do not drink  unpasteurized milk.  Avoid toxoplasmosis infection during pregnancy. Follow these precautions:  Do not change your cat's litter box, if you have a cat. Ask someone else to do this for you.  Wear gardening gloves while working in the yard. General Instructions  Keep all follow-up visits as directed by your health care provider. This is important. This includes prenatal care and screening tests.  Manage any chronic health conditions. Work closely with your health care provider to keep conditions, such as diabetes, under control. How do I know if my baby is developing well? At each prenatal visit, your health care provider will do several different tests to check on your health and keep track of your baby's development. These include:  Fundal height.  Your health care provider will measure your growing belly from top to bottom using a tape measure.  Your health care provider will also feel your belly to determine your baby's position.  Heartbeat.  An ultrasound in the first trimester   can confirm pregnancy and show a heartbeat, depending on how far along you are.  Your health care provider will check your baby's heart rate at every prenatal visit.  As you get closer to your delivery date, you may have regular fetal heart rate monitoring to make sure that your baby is not in distress.  Second trimester ultrasound.  This ultrasound checks your baby's development. It also indicates your baby's gender. What should I do if I have concerns about my baby's development? Always talk with your health care provider about any concerns that you may have. This information is not intended to replace advice given to you by your health care provider. Make sure you discuss any questions you have with your health care provider. Document Released: 09/05/2007 Document Revised: 08/25/2015 Document Reviewed: 08/26/2013  2017 Elsevier  

## 2016-05-03 NOTE — Progress Notes (Signed)
Bedside US performed, SIUP with + FHR = 168.  CRL measuring 11w 0d which correlates with LMP

## 2016-05-04 DIAGNOSIS — Z283 Underimmunization status: Secondary | ICD-10-CM | POA: Insufficient documentation

## 2016-05-04 DIAGNOSIS — O09899 Supervision of other high risk pregnancies, unspecified trimester: Secondary | ICD-10-CM | POA: Insufficient documentation

## 2016-05-04 DIAGNOSIS — O9989 Other specified diseases and conditions complicating pregnancy, childbirth and the puerperium: Secondary | ICD-10-CM

## 2016-05-04 LAB — HEMOGLOBIN A1C
ESTIMATED AVERAGE GLUCOSE: 94 mg/dL
Hgb A1c MFr Bld: 4.9 % (ref 4.8–5.6)

## 2016-05-05 LAB — CULTURE, OB URINE

## 2016-05-05 LAB — URINE CULTURE, OB REFLEX

## 2016-05-07 LAB — CYTOLOGY - PAP
CHLAMYDIA, DNA PROBE: NEGATIVE
DIAGNOSIS: NEGATIVE
HPV (WINDOPATH): NOT DETECTED
NEISSERIA GONORRHEA: NEGATIVE

## 2016-05-09 LAB — OBSTETRIC PANEL, INCLUDING HIV
Antibody Screen: NEGATIVE
BASOS ABS: 0 10*3/uL (ref 0.0–0.2)
Basos: 0 %
EOS (ABSOLUTE): 0.1 10*3/uL (ref 0.0–0.4)
Eos: 1 %
HEP B S AG: NEGATIVE
HIV Screen 4th Generation wRfx: NONREACTIVE
Hematocrit: 37.4 % (ref 34.0–46.6)
Hemoglobin: 12.9 g/dL (ref 11.1–15.9)
IMMATURE GRANULOCYTES: 0 %
Immature Grans (Abs): 0 10*3/uL (ref 0.0–0.1)
LYMPHS ABS: 1.7 10*3/uL (ref 0.7–3.1)
Lymphs: 29 %
MCH: 30.1 pg (ref 26.6–33.0)
MCHC: 34.5 g/dL (ref 31.5–35.7)
MCV: 87 fL (ref 79–97)
MONOS ABS: 0.6 10*3/uL (ref 0.1–0.9)
Monocytes: 9 %
NEUTROS ABS: 3.6 10*3/uL (ref 1.4–7.0)
Neutrophils: 61 %
PLATELETS: 179 10*3/uL (ref 150–379)
RBC: 4.29 x10E6/uL (ref 3.77–5.28)
RDW: 13.1 % (ref 12.3–15.4)
RPR Ser Ql: NONREACTIVE
Rh Factor: POSITIVE
Rubella Antibodies, IGG: <0.9 index — ABNORMAL LOW (ref >0.99)
WBC: 5.9 10*3/uL (ref 3.4–10.8)

## 2016-05-09 LAB — HEMOGLOBINOPATHY EVALUATION
HEMOGLOBIN A2 QUANTITATION: 2.5 % (ref 1.8–3.2)
HEMOGLOBIN F QUANTITATION: 0 % (ref 0.0–2.0)
HGB A: 97.5 % (ref 96.4–98.8)
HGB C: 0 %
HGB S: 0 %
HGB VARIANT: 0 %

## 2016-05-09 LAB — CYSTIC FIBROSIS MUTATION 97: Interpretation: NOT DETECTED

## 2016-05-09 LAB — TOXASSURE SELECT 13 (MW), URINE

## 2016-05-11 ENCOUNTER — Telehealth: Payer: Self-pay | Admitting: *Deleted

## 2016-05-11 DIAGNOSIS — R6889 Other general symptoms and signs: Secondary | ICD-10-CM

## 2016-05-11 MED ORDER — OSELTAMIVIR PHOSPHATE 75 MG PO CAPS: 75.0000 mg | ORAL_CAPSULE | Freq: Two times a day (BID) | ORAL | 0 refills | Status: DC

## 2016-05-11 NOTE — Telephone Encounter (Signed)
Pt is currently pregnant, called the office stating she is having flu like symptoms. Instructed to take Tylenol for fever, drink fluids, and sent Tamiflu to the pharmacy.

## 2016-05-11 NOTE — Telephone Encounter (Signed)
-----   Message from Lindell SparHeather L Bacon, [place] sent at 05/11/2016 10:09 AM EST ----- Regarding: Rx request  Contact: [phone] Please call in [facility] to [facility] [place].  Thanks

## 2016-06-18 ENCOUNTER — Ambulatory Visit (HOSPITAL_COMMUNITY): Payer: Medicaid Other

## 2016-06-28 ENCOUNTER — Encounter: Payer: Medicaid Other | Admitting: Family Medicine

## 2016-07-02 ENCOUNTER — Ambulatory Visit (INDEPENDENT_AMBULATORY_CARE_PROVIDER_SITE_OTHER): Payer: Medicaid Other | Admitting: Obstetrics and Gynecology

## 2016-07-02 ENCOUNTER — Ambulatory Visit (HOSPITAL_COMMUNITY): Payer: Medicaid Other

## 2016-07-02 ENCOUNTER — Encounter: Payer: Self-pay | Admitting: Obstetrics and Gynecology

## 2016-07-02 VITALS — BP 123/83 | HR 99 | Wt 225.0 lb

## 2016-07-02 DIAGNOSIS — Z3490 Encounter for supervision of normal pregnancy, unspecified, unspecified trimester: Secondary | ICD-10-CM

## 2016-07-02 DIAGNOSIS — Z8619 Personal history of other infectious and parasitic diseases: Secondary | ICD-10-CM

## 2016-07-02 DIAGNOSIS — Z3402 Encounter for supervision of normal first pregnancy, second trimester: Secondary | ICD-10-CM

## 2016-07-02 NOTE — Progress Notes (Signed)
Prenatal Visit Note Date: 07/02/2016 Clinic: [facility]  Subjective:  Allison Moreno is a 31 y.o. G1P0 at [redacted]w[redacted]d being seen today for ongoing prenatal care.  She is currently monitored for the following issues for this low-risk pregnancy and has Adjustment disorder with anxious mood; ADD (attention deficit disorder) without hyperactivity; Supervision of normal pregnancy, antepartum; Adopted; and Rubella non-immune status, antepartum on her problem list.  Patient reports no complaints.   Contractions: Not present. Vag. Bleeding: None.  Movement: Present. Denies leaking of fluid.   The following portions of the patient's history were reviewed and updated as appropriate: allergies, current medications, past family history, past medical history, past social history, past surgical history and problem list. Problem list updated.  Objective:   Vitals:   07/02/16 1321  BP: 123/83  Pulse: 99  Weight: 225 lb (102.1 kg)    Fetal Status: Fetal Heart Rate (bpm): 150   Movement: Present     General:  Alert, oriented and cooperative. Patient is in no acute distress.  Skin: Skin is warm and dry. No rash noted.   Cardiovascular: Normal heart rate noted  Respiratory: Normal respiratory effort, no problems with respiration noted  Abdomen: Soft, gravid, appropriate for gestational age. Pain/Pressure: Absent     Pelvic:  Cervical exam deferred        Extremities: Normal range of motion.  Edema: None  Mental Status: Normal mood and affect. Normal behavior. Normal judgment and thought content.   Urinalysis:      Assessment and Plan:  Pregnancy: G1P0 at [redacted]w[redacted]d  1. Encounter for supervision of normal pregnancy, antepartum, unspecified gravidity Routine care. Anatomy scan this week. Declines genetic screening.    Preterm labor symptoms and general obstetric precautions including but not limited to vaginal bleeding, contractions, leaking of fluid and fetal movement were reviewed in  detail with the patient. Please refer to After Visit Summary for other counseling recommendations.  Return in about 4 weeks (around 07/30/2016) for 4-5wk rob.    Bing, MD

## 2016-07-04 ENCOUNTER — Ambulatory Visit (HOSPITAL_COMMUNITY)
Admission: RE | Admit: 2016-07-04 | Discharge: 2016-07-04 | Disposition: A | Discharge status: Home / Self Care | Payer: Medicaid Other | Source: Ambulatory Visit | Attending: Student | Admitting: Student

## 2016-07-04 ENCOUNTER — Other Ambulatory Visit: Payer: Self-pay | Admitting: Student

## 2016-07-04 DIAGNOSIS — Z3490 Encounter for supervision of normal pregnancy, unspecified, unspecified trimester: Secondary | ICD-10-CM | POA: Diagnosis present

## 2016-07-04 DIAGNOSIS — Z3492 Encounter for supervision of normal pregnancy, unspecified, second trimester: Secondary | ICD-10-CM | POA: Diagnosis not present

## 2016-07-04 DIAGNOSIS — O99212 Obesity complicating pregnancy, second trimester: Secondary | ICD-10-CM | POA: Diagnosis not present

## 2016-07-04 DIAGNOSIS — Z3A20 20 weeks gestation of pregnancy: Secondary | ICD-10-CM | POA: Diagnosis not present

## 2016-07-04 DIAGNOSIS — Z3689 Encounter for other specified antenatal screening: Secondary | ICD-10-CM | POA: Insufficient documentation

## 2016-07-10 ENCOUNTER — Other Ambulatory Visit: Payer: Self-pay | Admitting: *Deleted

## 2016-07-10 DIAGNOSIS — Z3490 Encounter for supervision of normal pregnancy, unspecified, unspecified trimester: Secondary | ICD-10-CM

## 2016-07-16 ENCOUNTER — Encounter: Payer: Self-pay | Admitting: *Deleted

## 2016-07-16 ENCOUNTER — Other Ambulatory Visit (INDEPENDENT_AMBULATORY_CARE_PROVIDER_SITE_OTHER): Payer: Medicaid Other | Admitting: *Deleted

## 2016-07-16 VITALS — BP 119/84 | HR 91 | Wt 225.0 lb

## 2016-07-16 DIAGNOSIS — M5441 Lumbago with sciatica, right side: Secondary | ICD-10-CM | POA: Diagnosis not present

## 2016-07-16 DIAGNOSIS — R350 Frequency of micturition: Secondary | ICD-10-CM

## 2016-07-16 LAB — POCT URINALYSIS DIPSTICK
Bilirubin, UA: NEGATIVE
Glucose, UA: NEGATIVE
Ketones, UA: NEGATIVE
Nitrite, UA: NEGATIVE
Protein, UA: NEGATIVE
Spec Grav, UA: 1.025 (ref 1.010–1.025)
Urobilinogen, UA: 0.2 E.U./dL
pH, UA: 6.5 (ref 5.0–8.0)

## 2016-07-16 MED ORDER — NITROFURANTOIN MONOHYD MACRO 100 MG PO CAPS
100.0000 mg | ORAL_CAPSULE | Freq: Two times a day (BID) | ORAL | 1 refills | Status: DC
Start: 2016-07-16 — End: 2016-09-06

## 2016-07-16 NOTE — Progress Notes (Signed)
Pt is currently [redacted] weeks pregnant and is c/o right lower back pain that radiates down her right thigh.  Also c/o urinary frequency and pressure and a headache for the past few days, BP 119/84.  Took Tylenol once for the headache and stated it did ease it some.  UA + leukocytes and slight trace of blood.  Sent urine cx for confirmation.  Macrobid sent to the pharmacy and instructed pt on medication use. Encouraged to push fluids and empty bladder often.  Also encouraged to take Tylenol every eight hours to help with sciatica back pain and headache.  Instructed to apply heat to the lower back to help with sciatica pain.  Pt acknowledged instructions.

## 2016-07-19 LAB — URINE CULTURE, OB REFLEX

## 2016-07-19 LAB — CULTURE, OB URINE

## 2016-08-01 ENCOUNTER — Telehealth: Payer: Self-pay | Admitting: *Deleted

## 2016-08-01 ENCOUNTER — Encounter (HOSPITAL_COMMUNITY): Payer: Self-pay | Admitting: *Deleted

## 2016-08-01 ENCOUNTER — Inpatient Hospital Stay (HOSPITAL_COMMUNITY)
Admission: AD | Admit: 2016-08-01 | Discharge: 2016-08-01 | Disposition: A | Discharge status: Home / Self Care | Payer: Medicaid Other | Source: Ambulatory Visit | Attending: Obstetrics and Gynecology | Admitting: Obstetrics and Gynecology

## 2016-08-01 ENCOUNTER — Encounter: Payer: Self-pay | Admitting: Student

## 2016-08-01 DIAGNOSIS — M549 Dorsalgia, unspecified: Secondary | ICD-10-CM | POA: Diagnosis not present

## 2016-08-01 DIAGNOSIS — R109 Unspecified abdominal pain: Secondary | ICD-10-CM | POA: Diagnosis present

## 2016-08-01 DIAGNOSIS — O26892 Other specified pregnancy related conditions, second trimester: Secondary | ICD-10-CM | POA: Diagnosis not present

## 2016-08-01 DIAGNOSIS — O26899 Other specified pregnancy related conditions, unspecified trimester: Secondary | ICD-10-CM

## 2016-08-01 DIAGNOSIS — F988 Other specified behavioral and emotional disorders with onset usually occurring in childhood and adolescence: Secondary | ICD-10-CM | POA: Insufficient documentation

## 2016-08-01 DIAGNOSIS — Z3A24 24 weeks gestation of pregnancy: Secondary | ICD-10-CM | POA: Insufficient documentation

## 2016-08-01 DIAGNOSIS — F329 Major depressive disorder, single episode, unspecified: Secondary | ICD-10-CM | POA: Diagnosis not present

## 2016-08-01 DIAGNOSIS — O99342 Other mental disorders complicating pregnancy, second trimester: Secondary | ICD-10-CM | POA: Diagnosis not present

## 2016-08-01 DIAGNOSIS — O9989 Other specified diseases and conditions complicating pregnancy, childbirth and the puerperium: Secondary | ICD-10-CM

## 2016-08-01 LAB — CBC
HCT: 33.5 % — ABNORMAL LOW (ref 36.0–46.0)
HEMOGLOBIN: 11.5 g/dL — AB (ref 12.0–15.0)
MCH: 30.3 pg (ref 26.0–34.0)
MCHC: 34.3 g/dL (ref 30.0–36.0)
MCV: 88.2 fL (ref 78.0–100.0)
Platelets: 149 10*3/uL — ABNORMAL LOW (ref 150–400)
RBC: 3.8 MIL/uL — AB (ref 3.87–5.11)
RDW: 12.9 % (ref 11.5–15.5)
WBC: 6.8 10*3/uL (ref 4.0–10.5)

## 2016-08-01 LAB — COMPREHENSIVE METABOLIC PANEL
ALBUMIN: 2.9 g/dL — AB (ref 3.5–5.0)
ALT: 23 U/L (ref 14–54)
AST: 23 U/L (ref 15–41)
Alkaline Phosphatase: 43 U/L (ref 38–126)
Anion gap: 6 (ref 5–15)
BUN: 10 mg/dL (ref 6–20)
CHLORIDE: 105 mmol/L (ref 101–111)
CO2: 23 mmol/L (ref 22–32)
CREATININE: 0.71 mg/dL (ref 0.44–1.00)
Calcium: 8.5 mg/dL — ABNORMAL LOW (ref 8.9–10.3)
GFR calc Af Amer: >60 mL/min (ref >60)
GFR calc non Af Amer: >60 mL/min (ref >60)
GLUCOSE: 102 mg/dL — AB (ref 65–99)
POTASSIUM: 3.4 mmol/L — AB (ref 3.5–5.1)
SODIUM: 134 mmol/L — AB (ref 135–145)
Total Bilirubin: 0.3 mg/dL (ref 0.3–1.2)
Total Protein: 6.1 g/dL — ABNORMAL LOW (ref 6.5–8.1)

## 2016-08-01 LAB — URINALYSIS, ROUTINE W REFLEX MICROSCOPIC
BILIRUBIN URINE: NEGATIVE
Glucose, UA: NEGATIVE mg/dL
Hgb urine dipstick: NEGATIVE
KETONES UR: NEGATIVE mg/dL
Leukocytes, UA: NEGATIVE
NITRITE: NEGATIVE
PH: 7 (ref 5.0–8.0)
Protein, ur: NEGATIVE mg/dL
Specific Gravity, Urine: 1.017 (ref 1.005–1.030)

## 2016-08-01 LAB — LIPASE, BLOOD: LIPASE: 19 U/L (ref 11–51)

## 2016-08-01 MED ORDER — IBUPROFEN 600 MG PO TABS
600.0000 mg | ORAL_TABLET | Freq: Once | ORAL | Status: AC
  Administered 2016-08-01: 600 mg via ORAL
  Filled 2016-08-01: qty 1

## 2016-08-01 NOTE — Telephone Encounter (Signed)
Pt was seen at the hospital yesterday for back and abdominal pain, as well as nausea. Pt states she is is still having some generalized abdominal pain and mid-upper back pain.  Encouraged the pt get a pregnancy belly support band and see if that would help.  Also informed her she could use Tylenol as needed and apply heat applications to her back.  Instructed to call back with any change.

## 2016-08-01 NOTE — MAU Provider Note (Signed)
History     CSN: [id number]  Arrival date and time: 08/01/16 0106  First Provider Initiated Contact with Patient 08/01/16 0157      Chief Complaint  Patient presents with  . Abdominal Pain   HPI Allison Moreno is a 31 y.o. G1P0 at 41w2dwho presents with abdominal pain.  Symptoms began around 1130 pm tonight. Pain is located in RUQ & is described as intermittent aching. Vomited once when she first woke up but no further nausea or vomiting. Rates pain 5/10. Has not treated. Ate a hot dog 1-2 hours prior laying down for sleep. Also reports right flank pain that has been ongoing throughout pregnancy. Rates pain 5/10. Was diagnosed with UTI last month & completed abx. Denies dysuria, hematuria, fever/chills, vaginal bleeding, diarrhea, or constipation. Positive fetal movement.     OB History    Gravida Para Term Preterm AB Living   1             SAB TAB Ectopic Multiple Live Births                  Past Medical History:  Diagnosis Date  . ADD (attention deficit disorder)   . Chronic ear infection   . Depression   . 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 TONSILLECTOMY    . TYMPANOPLASTY Right   . WISDOM TOOTH EXTRACTION      Family History  Problem Relation Age of Onset  . Depression Mother   . Diabetes Father     Social History  Substance Use Topics  . Smoking status: Never Smoker  . Smokeless tobacco: Never Used  . Alcohol use 0.0 oz/week    Allergies:  Allergies  Allergen Reactions  . Sulfa Drugs Cross Reactors     Prescriptions Prior to Admission  Medication Sig Dispense Refill Last Dose  . nitrofurantoin, macrocrystal-monohydrate, (MACROBID) 100 MG capsule Take 1 capsule (100 mg total) by mouth 2 (two) times daily. 14 capsule 1   . Prenatal Vit-Fe Fumarate-FA (PRENATAL VITAMIN PO) Take 1 tablet by mouth daily.   Taking     Review of Systems  Constitutional: Negative.   Gastrointestinal: Positive for abdominal pain and vomiting (once). Negative for constipation, diarrhea and nausea.  Genitourinary: Positive for flank pain. Negative for dysuria, frequency, hematuria, vaginal bleeding and vaginal discharge.  Musculoskeletal: Positive for back pain.   Physical Exam   Blood pressure 118/76, pulse 80, temperature 98.2 F (36.8 C), temperature source Oral, resp. rate 20, height '5\' 6"'  (1.676 m), weight 227 lb (103 kg), last menstrual period 02/13/2016.  Physical Exam  Nursing note and vitals reviewed. Constitutional: She is oriented to person, place, and time. She appears well-developed and well-nourished. No distress.  HENT:  Head: Normocephalic and atraumatic.  Eyes: Conjunctivae are normal. Right eye exhibits no discharge. Left eye exhibits no discharge. No scleral icterus.  Neck: Normal range of motion.  Cardiovascular: Normal rate, regular rhythm and normal heart sounds.   No murmur heard. Respiratory: Effort normal and breath sounds normal. No respiratory distress. She has no wheezes.  GI: Soft. Bowel sounds are normal. There is tenderness in the right upper quadrant and epigastric area. There is no guarding, no CVA tenderness and negative Murphy's sign.  Neurological: She is alert and oriented to person, place, and time.  Skin: Skin is warm and dry. She  is not diaphoretic.  Psychiatric: She has a normal mood and affect. Her behavior is normal. Judgment and thought content normal.   Dilation: Closed Effacement (%): Thick Exam by:: Jorje Guild, NP  Fetal Tracing:  Baseline: 145 Variability:moderate Accelerations: 10x10 Decelerations: none  Toco: none MAU Course  Procedures Results for orders placed or performed during the hospital encounter of 08/01/16 (from the past 48 hour(s))  Urinalysis, Routine w reflex microscopic     Status: None   Collection Time: 08/01/16  1:22 AM  Result Value  Ref Range   Color, Urine YELLOW YELLOW   APPearance CLEAR CLEAR   Specific Gravity, Urine 1.017 1.005 - 1.030   pH 7.0 5.0 - 8.0   Glucose, UA NEGATIVE NEGATIVE mg/dL   Hgb urine dipstick NEGATIVE NEGATIVE   Bilirubin Urine NEGATIVE NEGATIVE   Ketones, ur NEGATIVE NEGATIVE mg/dL   Protein, ur NEGATIVE NEGATIVE mg/dL   Nitrite NEGATIVE NEGATIVE   Leukocytes, UA NEGATIVE NEGATIVE  CBC     Status: Abnormal   Collection Time: 08/01/16  2:05 AM  Result Value Ref Range   WBC 6.8 4.0 - 10.5 K/uL   RBC 3.80 (L) 3.87 - 5.11 MIL/uL   Hemoglobin 11.5 (L) 12.0 - 15.0 g/dL   HCT 33.5 (L) 36.0 - 46.0 %   MCV 88.2 78.0 - 100.0 fL   MCH 30.3 26.0 - 34.0 pg   MCHC 34.3 30.0 - 36.0 g/dL   RDW 12.9 11.5 - 15.5 %   Platelets 149 (L) 150 - 400 K/uL  Lipase, blood     Status: None   Collection Time: 08/01/16  2:05 AM  Result Value Ref Range   Lipase 19 11 - 51 U/L  Comprehensive metabolic panel     Status: Abnormal   Collection Time: 08/01/16  2:05 AM  Result Value Ref Range   Sodium 134 (L) 135 - 145 mmol/L   Potassium 3.4 (L) 3.5 - 5.1 mmol/L   Chloride 105 101 - 111 mmol/L   CO2 23 22 - 32 mmol/L   Glucose, Bld 102 (H) 65 - 99 mg/dL   BUN 10 6 - 20 mg/dL   Creatinine, Ser 0.71 0.44 - 1.00 mg/dL   Calcium 8.5 (L) 8.9 - 10.3 mg/dL   Total Protein 6.1 (L) 6.5 - 8.1 g/dL   Albumin 2.9 (L) 3.5 - 5.0 g/dL   AST 23 15 - 41 U/L   ALT 23 14 - 54 U/L   Alkaline Phosphatase 43 38 - 126 U/L   Total Bilirubin 0.3 0.3 - 1.2 mg/dL   GFR calc non Af Amer >60 >60 mL/min   GFR calc Af Amer >60 >60 mL/min    Comment: (NOTE) The eGFR has been calculated using the CKD EPI equation. This calculation has not been validated in all clinical situations. eGFR's persistently <60 mL/min signify possible Chronic Kidney Disease.    Anion gap 6 5 - 15    MDM Fetal tracing appropriate for gestation; no ctx Cervix closed Ibuprofen 600 mg PO, hot pack applied to back Pt reports improvement in symptoms VSS,  NAD CBC, CMP, lipase Negative u/a, no CVAT Assessment and Plan  A; 1. Back pain affecting pregnancy in second trimester   2. Abdominal pain affecting pregnancy    P: Discharge home Avoid high fat & fried foods --- wait >3 hrs after eating to lay down Discussed reasons to return to MAU If symptoms continue contact ob/gyn or PCP Keep f/u with OB  Jorje Guild 08/01/2016,  1:57 AM

## 2016-08-01 NOTE — Discharge Instructions (Signed)

## 2016-08-01 NOTE — MAU Note (Addendum)
PT  SAYS SHE  FEELS  ALL OVER ABD PAIN- THOUGHT  IT WAS GAS- FELL ASLEEP-  NOW  FEELS  PAIN ACROSS UMBILICUS.  - ALL STARTED    Monday.   BACK ACHE  STARTED  X2 WEEKS        .  PNC   AT [facility]-   TOLD  HAD UTI-    4-16  -  HAS FINISHED  MEDS .      STARTED NAUSEA  AT 2330-

## 2016-08-28 ENCOUNTER — Encounter: Payer: Medicaid Other | Admitting: Family Medicine

## 2016-09-04 ENCOUNTER — Inpatient Hospital Stay (HOSPITAL_COMMUNITY)
Admission: AD | Admit: 2016-09-04 | Discharge: 2016-09-04 | Disposition: A | Discharge status: Home / Self Care | Payer: Medicaid Other | Source: Ambulatory Visit | Attending: Family Medicine | Admitting: Family Medicine

## 2016-09-04 ENCOUNTER — Encounter (HOSPITAL_COMMUNITY): Payer: Self-pay | Admitting: *Deleted

## 2016-09-04 DIAGNOSIS — O4703 False labor before 37 completed weeks of gestation, third trimester: Secondary | ICD-10-CM | POA: Diagnosis not present

## 2016-09-04 DIAGNOSIS — Z3A29 29 weeks gestation of pregnancy: Secondary | ICD-10-CM | POA: Insufficient documentation

## 2016-09-04 DIAGNOSIS — Z833 Family history of diabetes mellitus: Secondary | ICD-10-CM | POA: Diagnosis not present

## 2016-09-04 DIAGNOSIS — O479 False labor, unspecified: Secondary | ICD-10-CM | POA: Diagnosis not present

## 2016-09-04 DIAGNOSIS — Z9889 Other specified postprocedural states: Secondary | ICD-10-CM | POA: Diagnosis not present

## 2016-09-04 DIAGNOSIS — Z882 Allergy status to sulfonamides status: Secondary | ICD-10-CM | POA: Insufficient documentation

## 2016-09-04 DIAGNOSIS — Z8619 Personal history of other infectious and parasitic diseases: Secondary | ICD-10-CM | POA: Insufficient documentation

## 2016-09-04 DIAGNOSIS — Z818 Family history of other mental and behavioral disorders: Secondary | ICD-10-CM | POA: Insufficient documentation

## 2016-09-04 DIAGNOSIS — R102 Pelvic and perineal pain: Secondary | ICD-10-CM | POA: Diagnosis present

## 2016-09-04 LAB — URINALYSIS, ROUTINE W REFLEX MICROSCOPIC
BILIRUBIN URINE: NEGATIVE
Glucose, UA: NEGATIVE mg/dL
Hgb urine dipstick: NEGATIVE
Ketones, ur: NEGATIVE mg/dL
Nitrite: NEGATIVE
PH: 5 (ref 5.0–8.0)
Protein, ur: 30 mg/dL — AB
SPECIFIC GRAVITY, URINE: 1.033 — AB (ref 1.005–1.030)

## 2016-09-04 NOTE — MAU Note (Signed)
Pt reports she started having lower abd tightening off/on since yesterday, worsening today. Denies bleeding or ROM. Reports good fetal movement.

## 2016-09-04 NOTE — Discharge Instructions (Signed)

## 2016-09-04 NOTE — MAU Provider Note (Signed)
Chief Complaint:  No chief complaint on file.   First Provider Initiated Contact with Patient 09/04/16 1229      HPI: Allison Moreno is a 31 y.o. G1P0 at [redacted]w[redacted]d who presents to maternity admissions reporting contractions.  Patient states for the past 2 days, she has noticed tightening of her lower abdomen. It occurs for about 15 sec, comes/goes for at most 2-72min and then goes away. The pain is maybe a 4/10, denies any sharpness or intensity. She is unsure if these are concerning contractions. She states she is not having these contraction like pains since being in MAU.  Denies leakage of fluid or vaginal bleeding. Good fetal movement.     Past Medical History: Past Medical History:  Diagnosis Date  . ADD (attention deficit disorder)   . Chronic ear infection   . Depression   . 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 TONSILLECTOMY    . TYMPANOPLASTY Right   . WISDOM TOOTH EXTRACTION       Family History: Family History  Problem Relation Age of Onset  . Depression Mother   . Diabetes Father     Social History: Social History  Substance Use Topics  . Smoking status: Never Smoker  . Smokeless tobacco: Never Used  . Alcohol use 0.0 oz/week    Allergies:  Allergies  Allergen Reactions  . Sulfa Drugs Cross Reactors     Meds:  Prescriptions Prior to Admission  Medication Sig Dispense Refill Last Dose  . nitrofurantoin, macrocrystal-monohydrate, (MACROBID) 100 MG capsule Take 1 capsule (100 mg total) by mouth 2 (two) times daily. 14 capsule 1   . Prenatal Vit-Fe Fumarate-FA (PRENATAL VITAMIN  PO) Take 1 tablet by mouth daily.   Taking    I have reviewed patient's Past Medical Hx, Surgical Hx, Family Hx, Social Hx, medications and allergies.   ROS:  A comprehensive ROS was negative except per HPI.    Physical Exam   Patient Vitals for the past 24 hrs:  BP Temp Temp src Pulse Resp SpO2 Height Weight  09/04/16 1205 130/83 98.4 F (36.9 C) Oral 100 16 100 % 5\' 6"  (1.676 m) 228 lb (103.4 kg)   Constitutional: Well-developed, well-nourished female in no acute distress.  Cardiovascular: normal rate Respiratory: normal effort GI: Abd soft, non-tender, gravid appropriate for gestational age. Pos BS x 4 MS: Extremities nontender, no edema, normal ROM Neurologic: Alert and oriented x 4.  GU: Neg CVAT. SVE: FFN collected but not sent, closed/thick and LONG/posterior, high     Labs: Results for orders placed or performed during the hospital encounter of 09/04/16 (from the past 24 hour(s))  Urinalysis, Routine w reflex microscopic     Status: Abnormal   Collection Time: 09/04/16 11:50 AM  Result Value Ref Range   Color, Urine AMBER (A) YELLOW   APPearance HAZY (A) CLEAR   Specific Gravity, Urine 1.033 (H) 1.005 - 1.030  pH 5.0 5.0 - 8.0   Glucose, UA NEGATIVE NEGATIVE mg/dL   Hgb urine dipstick NEGATIVE NEGATIVE   Bilirubin Urine NEGATIVE NEGATIVE   Ketones, ur NEGATIVE NEGATIVE mg/dL   Protein, ur 30 (A) NEGATIVE mg/dL   Nitrite NEGATIVE NEGATIVE   Leukocytes, UA TRACE (A) NEGATIVE   RBC / HPF 0-5 0 - 5 RBC/hpf   WBC, UA 0-5 0 - 5 WBC/hpf   Bacteria, UA FEW (A) NONE SEEN   Squamous Epithelial / LPF 6-30 (A) NONE SEEN   Mucous PRESENT    Hyaline Casts, UA PRESENT     Imaging:  No results found.  MAU Course: SVE - closed/thick/long FFN collected but not sent, not a laboring cervix, no further contractions while in MAU  I personally reviewed the patient's NST today, found to be REACTIVE. 150 bpm, mod var, +accels (10x10), no decels. CTX: None.   MDM: Plan of  care reviewed with patient, including labs and tests ordered and medical treatment.   Assessment: 1. Labor, false (Braxton-Hicks)     Plan: Discharge home in stable condition.  Preterm Labor precautions and fetal kick counts, discussed what true labor contractions should feel like and when to return to MAU   Follow-up Information    [facility]. Schedule an appointment as soon as possible for a visit in 1 week(s).   Specialty:  Obstetrics and Gynecology Why:  Routine OB follow up Contact information: [address] [place] [id number] [phone]          Allergies as of 09/04/2016      Reactions   Sulfa Drugs Cross Reactors       Medication List    TAKE these medications   nitrofurantoin (macrocrystal-monohydrate) 100 MG capsule Commonly known as:  MACROBID Take 1 capsule (100 mg total) by mouth 2 (two) times daily.   PRENATAL VITAMIN PO Take 1 tablet by mouth daily.       Jen MowElizabeth Mumaw, DO OB Fellow [facility] 09/04/2016 1:04 PM

## 2016-09-06 ENCOUNTER — Ambulatory Visit (INDEPENDENT_AMBULATORY_CARE_PROVIDER_SITE_OTHER): Payer: Medicaid Other | Admitting: Obstetrics and Gynecology

## 2016-09-06 ENCOUNTER — Encounter: Payer: Self-pay | Admitting: Radiology

## 2016-09-06 ENCOUNTER — Encounter: Payer: Self-pay | Admitting: *Deleted

## 2016-09-06 ENCOUNTER — Ambulatory Visit (HOSPITAL_COMMUNITY)
Admission: RE | Admit: 2016-09-06 | Discharge: 2016-09-06 | Disposition: A | Discharge status: Home / Self Care | Payer: Medicaid Other | Source: Ambulatory Visit | Attending: Family Medicine | Admitting: Family Medicine

## 2016-09-06 VITALS — BP 107/79 | HR 98 | Wt 227.0 lb

## 2016-09-06 DIAGNOSIS — Z3A29 29 weeks gestation of pregnancy: Secondary | ICD-10-CM | POA: Insufficient documentation

## 2016-09-06 DIAGNOSIS — H6693 Otitis media, unspecified, bilateral: Secondary | ICD-10-CM

## 2016-09-06 DIAGNOSIS — Z23 Encounter for immunization: Secondary | ICD-10-CM | POA: Diagnosis not present

## 2016-09-06 DIAGNOSIS — O99213 Obesity complicating pregnancy, third trimester: Secondary | ICD-10-CM | POA: Insufficient documentation

## 2016-09-06 DIAGNOSIS — Z362 Encounter for other antenatal screening follow-up: Secondary | ICD-10-CM | POA: Insufficient documentation

## 2016-09-06 DIAGNOSIS — Z3482 Encounter for supervision of other normal pregnancy, second trimester: Secondary | ICD-10-CM | POA: Diagnosis not present

## 2016-09-06 DIAGNOSIS — Z3402 Encounter for supervision of normal first pregnancy, second trimester: Secondary | ICD-10-CM

## 2016-09-06 DIAGNOSIS — O4442 Low lying placenta NOS or without hemorrhage, second trimester: Secondary | ICD-10-CM

## 2016-09-06 DIAGNOSIS — Z3400 Encounter for supervision of normal first pregnancy, unspecified trimester: Secondary | ICD-10-CM

## 2016-09-06 DIAGNOSIS — Z3490 Encounter for supervision of normal pregnancy, unspecified, unspecified trimester: Secondary | ICD-10-CM

## 2016-09-06 DIAGNOSIS — O4440 Low lying placenta NOS or without hemorrhage, unspecified trimester: Secondary | ICD-10-CM | POA: Insufficient documentation

## 2016-09-06 MED ORDER — AMOXICILLIN 500 MG PO CAPS: 500.0000 mg | ORAL_CAPSULE | Freq: Two times a day (BID) | ORAL | 0 refills | Status: DC

## 2016-09-06 NOTE — Progress Notes (Signed)
Prenatal Visit Note Date: 09/06/2016 Clinic: [facility]  Subjective:  Allison Moreno Delira is a 31 y.o. G1P0 at 3039w3d being seen today for ongoing prenatal care.  She is currently monitored for the following issues for this low-risk pregnancy and has Adjustment disorder with anxious mood; ADD (attention deficit disorder) without hyperactivity; Supervision of normal pregnancy, antepartum; Adopted; Rubella non-immune status, antepartum; and Low lying placenta, antepartum on her problem list.  Patient reports no OB complaints. H/o ear infections and notes 1wk h/o right ear tenderness and congestion. 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 No fevers, chills, cough, sore throat, chest pain, sob. +sinus congestion, right ear pain > left.    Contractions: Irritability. Vag. Bleeding: None.  Movement: Present. Denies leaking of fluid.   The following portions of the patient's history were reviewed and updated as appropriate: allergies, current medications, past family history, past medical history, past social history, past surgical history and problem list. Problem list updated.  Objective:   Vitals:   09/06/16 0828  BP: 107/79  Pulse: 98  Weight: 227 lb (103 kg)    Fetal Status: Fetal Heart Rate (bpm): 153   Movement: Present     General:  Alert, oriented and cooperative. Patient is in no acute distress.  Skin: Skin is warm and dry. No rash noted.   Cardiovascular: Normal heart rate noted, normal s1 and s2, no MRGs  Respiratory: Normal respiratory effort, no problems with respiration noted, CTAB  Abdomen: Soft, gravid, appropriate for gestational age. Pain/Pressure: Absent     Pelvic:  Cervical exam deferred        Extremities: Normal range of motion.  Edema: Trace  Mental Status: Normal mood and affect. Normal behavior. Normal judgment and thought content.   HEENT: thorat normal, no maxillary or frontal sinus tenderness. Pt sounds congested when talking. Mild ear pain with  tugging b/l and palpation. TMs slightly erythematous (right > left)  Urinalysis:      Assessment and Plan:  Pregnancy: G1P0 at 8739w3d  1. Supervision of normal first pregnancy, antepartum Routine care - Glucose Tolerance, 2 Hours w/1 Hour - CBC - RPR - HIV antibody (with reflex)  2. Low lying placenta, antepartum F/u u/s today. No s/s currently - US MFM OB Transvaginal; Future  3. Otitis of both ears amox sent in and urgent care precautions given.   Preterm labor symptoms and general obstetric precautions including but not limited to vaginal bleeding, contractions, leaking of fluid and fetal movement were reviewed in detail with the patient. Please refer to After Visit Summary for other counseling recommendations.  Return for per baby scripts.   [place] BingPickens, Ivianna Notch, MD

## 2016-09-07 LAB — CBC
Hematocrit: 39.1 % (ref 34.0–46.6)
Hemoglobin: 12.8 g/dL (ref 11.1–15.9)
MCH: 29.1 pg (ref 26.6–33.0)
MCHC: 32.7 g/dL (ref 31.5–35.7)
MCV: 89 fL (ref 79–97)
PLATELETS: 199 10*3/uL (ref 150–379)
RBC: 4.4 x10E6/uL (ref 3.77–5.28)
RDW: 13.4 % (ref 12.3–15.4)
WBC: 7 10*3/uL (ref 3.4–10.8)

## 2016-09-07 LAB — GLUCOSE TOLERANCE, 2 HOURS W/ 1HR
GLUCOSE, 1 HOUR: 107 mg/dL (ref 65–179)
GLUCOSE, FASTING: 86 mg/dL (ref 65–91)
Glucose, 2 hour: 97 mg/dL (ref 65–152)

## 2016-09-07 LAB — RPR: RPR: NONREACTIVE

## 2016-09-07 LAB — HIV ANTIBODY (ROUTINE TESTING W REFLEX): HIV Screen 4th Generation wRfx: NONREACTIVE

## 2016-09-10 NOTE — Addendum Note (Signed)
Addended by: Arne ClevelandHUTCHINSON, MANDY J on: 09/10/2016 12:01 PM   Modules accepted: Orders

## 2016-09-13 ENCOUNTER — Encounter: Payer: Self-pay | Admitting: Obstetrics and Gynecology

## 2016-09-16 ENCOUNTER — Encounter (HOSPITAL_COMMUNITY): Payer: Self-pay

## 2016-09-16 ENCOUNTER — Inpatient Hospital Stay (HOSPITAL_COMMUNITY)
Admission: AD | Admit: 2016-09-16 | Discharge: 2016-09-16 | Disposition: A | Discharge status: Home / Self Care | Payer: Medicaid Other | Source: Ambulatory Visit | Attending: Obstetrics & Gynecology | Admitting: Obstetrics & Gynecology

## 2016-09-16 DIAGNOSIS — W19XXXA Unspecified fall, initial encounter: Secondary | ICD-10-CM | POA: Diagnosis not present

## 2016-09-16 DIAGNOSIS — Z8619 Personal history of other infectious and parasitic diseases: Secondary | ICD-10-CM | POA: Insufficient documentation

## 2016-09-16 DIAGNOSIS — O368130 Decreased fetal movements, third trimester, not applicable or unspecified: Secondary | ICD-10-CM | POA: Diagnosis present

## 2016-09-16 DIAGNOSIS — Z833 Family history of diabetes mellitus: Secondary | ICD-10-CM | POA: Insufficient documentation

## 2016-09-16 DIAGNOSIS — F329 Major depressive disorder, single episode, unspecified: Secondary | ICD-10-CM | POA: Diagnosis not present

## 2016-09-16 DIAGNOSIS — Z818 Family history of other mental and behavioral disorders: Secondary | ICD-10-CM | POA: Insufficient documentation

## 2016-09-16 DIAGNOSIS — Z888 Allergy status to other drugs, medicaments and biological substances status: Secondary | ICD-10-CM | POA: Diagnosis not present

## 2016-09-16 DIAGNOSIS — O99343 Other mental disorders complicating pregnancy, third trimester: Secondary | ICD-10-CM | POA: Insufficient documentation

## 2016-09-16 DIAGNOSIS — Z3A30 30 weeks gestation of pregnancy: Secondary | ICD-10-CM | POA: Diagnosis not present

## 2016-09-16 DIAGNOSIS — O288 Other abnormal findings on antenatal screening of mother: Secondary | ICD-10-CM

## 2016-09-16 DIAGNOSIS — Z79899 Other long term (current) drug therapy: Secondary | ICD-10-CM | POA: Insufficient documentation

## 2016-09-16 DIAGNOSIS — Z9889 Other specified postprocedural states: Secondary | ICD-10-CM | POA: Diagnosis not present

## 2016-09-16 DIAGNOSIS — F4322 Adjustment disorder with anxiety: Secondary | ICD-10-CM | POA: Insufficient documentation

## 2016-09-16 NOTE — MAU Provider Note (Signed)
[facility]    Provider Note   CSN: [id number] Arrival date & time: 09/16/16  1553     History   Chief Complaint Chief Complaint  Patient presents with  . Fall  . Decreased Fetal Movement    HPI Allison Moreno is a 31 y.o. G1P0 @ 819w6d who presents to the MAU with decreased fetal movement s/p fall yesterday. Patient reports that she was walking her dog in the dog park when another dog started running with her dog and patient lost her balance and fell on her right side. She reports that last night she was not feeling as much movement but thought she was just tired. Today she continues to feel decreased fetal movement. She denies vaginal d/c or leaking of fluid. She denies pain or soreness of the abdomen.   HPI  Past Medical History:  Diagnosis Date  . ADD (attention deficit disorder)   . Chronic ear infection   . Depression   . 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 Rubella non-immune status, antepartum 05/04/2016  . Supervision of normal pregnancy, antepartum 05/03/2016  . Adopted 05/03/2016  . Adjustment disorder with anxious mood 12/21/2014  . 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 TONSILLECTOMY    . TYMPANOPLASTY Right   . WISDOM TOOTH EXTRACTION      OB History    Gravida Para Term Preterm AB Living   1             SAB TAB Ectopic Multiple Live Births                   Home Medications    Prior to Admission medications   Medication Sig Start Date End Date Taking? Authorizing Provider  Prenatal MV-Min-FA-Omega-3 (PRENATAL GUMMIES/DHA & FA) 0.4-32.5 MG CHEW Chew 2 each by mouth at bedtime.   Yes [provider]  ranitidine (ZANTAC) 150 MG tablet Take 150-300 mg by mouth 2 (two) times daily as needed for heartburn.   Yes [provider]    Family History Family History  Problem Relation Age of Onset  . Depression Mother   . Diabetes Father     Social History Social History  Substance Use Topics  . Smoking status: Never Smoker  . Smokeless tobacco: Never Used  . Alcohol use 0.0 oz/week     Allergies   Sulfa antibiotics   Review of Systems Review of Systems  Constitutional: Negative for activity change, chills and fever.  HENT: Negative.   Eyes: Negative for visual disturbance.  Respiratory: Negative for shortness of breath.   Cardiovascular: Negative for chest pain and leg swelling.  Gastrointestinal: Negative for abdominal pain, nausea and vomiting.  Genitourinary: Negative for dyspareunia, dysuria and frequency.  Musculoskeletal: Negative for back pain.  Skin: Negative for wound.  Neurological: Negative for syncope and headaches.  Psychiatric/Behavioral: Negative for confusion. The patient is not nervous/anxious.      Physical Exam Updated Vital Signs BP 123/83 (BP Location: Left Arm)   Pulse 86   Temp 98.3 F (36.8 C) (Oral)   Resp 18   Ht 5\' 6"  (1.676 m)   Wt 227 lb (103 kg)   LMP 02/13/2016   BMI 36.64 kg/m   Physical Exam  Constitutional: She is oriented  to person, place, and time. She appears well-developed and well-nourished. No distress.  HENT:  Head: Atraumatic.  Eyes: EOM are normal.  Neck: Normal range of motion. Neck supple.  Cardiovascular: Normal rate and regular rhythm.   Pulmonary/Chest: Effort normal and breath sounds normal.  Abdominal: Soft. There is no tenderness.  Gravid @ 30 weeks 6 days  Musculoskeletal: Normal range of motion.  Neurological: She is alert and oriented to person, place, and time. No cranial nerve deficit.  Skin: Skin is warm and dry.  Psychiatric: She has a normal mood and affect. Her behavior is normal.  Nursing note and vitals reviewed.    ED Treatments / Results  Labs (all labs ordered are listed, but only abnormal  results are displayed) Labs Reviewed - No data to display  Radiology No results found.  Procedures EFM: 145, 15/15 accelerations No d-cells moderate variability  Procedures (including critical care time)  Medications Ordered in ED Medications - No data to display   Initial Impression / Assessment and Plan / ED Course  I have reviewed the triage vital signs and the nursing notes. I discussed this case with Dr. Macon Large. Since the fall occurred last night we will not do prolonged monitoring.   Final Clinical Impressions(s) / ED Diagnoses  31 y.o. female with feeling of decreased fetal movement s/p fall last night stable for d/c with reactive fetal monitor tracing, no pain and no contractions. Discussed with the patient findings and plan of care and all questioned fully answered. She will return if any problems arise.   Final diagnoses:  Fall, initial encounter  Non-reactive NST (non-stress test)    New Prescriptions Discharge Medication List as of 09/16/2016  6:04 PM

## 2016-09-16 NOTE — MAU Note (Addendum)
G1 @ 30.[redacted] wksga. Presents to triage for decreased fetal movement. States fell down yesterday @ a dog park when her dog and another dog brushed up against her. States fell on right side of hip. States was able to catch herself some. Denies bleeding or LOF.   EFM applied.   1800: discharge instructions given with pt understanding. Pt left unit via ambulatory.

## 2016-09-16 NOTE — Discharge Instructions (Signed)
Your monitor tracing today is normal. If you have any problems or symptoms worsen return for further evaluation.

## 2016-09-16 NOTE — MAU Note (Signed)
Patient fell at the dog park yesterday evening around 6:00 pm landed on right side, has been having decreased fetal movement since the fall, denies pain, denies vaginal bleeding.

## 2016-09-24 ENCOUNTER — Encounter (HOSPITAL_COMMUNITY): Payer: Self-pay

## 2016-09-24 ENCOUNTER — Inpatient Hospital Stay (HOSPITAL_COMMUNITY)
Admission: AD | Admit: 2016-09-24 | Discharge: 2016-09-24 | Disposition: A | Discharge status: Home / Self Care | Payer: Medicaid Other | Source: Ambulatory Visit | Attending: Obstetrics and Gynecology | Admitting: Obstetrics and Gynecology

## 2016-09-24 DIAGNOSIS — O99343 Other mental disorders complicating pregnancy, third trimester: Secondary | ICD-10-CM | POA: Insufficient documentation

## 2016-09-24 DIAGNOSIS — O9A213 Injury, poisoning and certain other consequences of external causes complicating pregnancy, third trimester: Secondary | ICD-10-CM | POA: Insufficient documentation

## 2016-09-24 DIAGNOSIS — T1490XA Injury, unspecified, initial encounter: Secondary | ICD-10-CM | POA: Diagnosis not present

## 2016-09-24 DIAGNOSIS — O9A211 Injury, poisoning and certain other consequences of external causes complicating pregnancy, first trimester: Secondary | ICD-10-CM | POA: Diagnosis not present

## 2016-09-24 DIAGNOSIS — Z3A32 32 weeks gestation of pregnancy: Secondary | ICD-10-CM | POA: Diagnosis present

## 2016-09-24 DIAGNOSIS — Y93K1 Activity, walking an animal: Secondary | ICD-10-CM | POA: Diagnosis not present

## 2016-09-24 DIAGNOSIS — F329 Major depressive disorder, single episode, unspecified: Secondary | ICD-10-CM | POA: Insufficient documentation

## 2016-09-24 LAB — COMPREHENSIVE METABOLIC PANEL
ALBUMIN: 2.9 g/dL — AB (ref 3.5–5.0)
ALT: 26 U/L (ref 14–54)
AST: 23 U/L (ref 15–41)
Alkaline Phosphatase: 68 U/L (ref 38–126)
Anion gap: 7 (ref 5–15)
BILIRUBIN TOTAL: 0.3 mg/dL (ref 0.3–1.2)
BUN: 13 mg/dL (ref 6–20)
CHLORIDE: 108 mmol/L (ref 101–111)
CO2: 22 mmol/L (ref 22–32)
CREATININE: 0.7 mg/dL (ref 0.44–1.00)
Calcium: 8.8 mg/dL — ABNORMAL LOW (ref 8.9–10.3)
GFR calc Af Amer: >60 mL/min (ref >60)
GLUCOSE: 100 mg/dL — AB (ref 65–99)
POTASSIUM: 3.6 mmol/L (ref 3.5–5.1)
Sodium: 137 mmol/L (ref 135–145)
TOTAL PROTEIN: 6.3 g/dL — AB (ref 6.5–8.1)

## 2016-09-24 LAB — PROTEIN / CREATININE RATIO, URINE
CREATININE, URINE: 58 mg/dL
Total Protein, Urine: <6 mg/dL

## 2016-09-24 LAB — CBC
HEMATOCRIT: 31.8 % — AB (ref 36.0–46.0)
Hemoglobin: 11 g/dL — ABNORMAL LOW (ref 12.0–15.0)
MCH: 30.1 pg (ref 26.0–34.0)
MCHC: 34.6 g/dL (ref 30.0–36.0)
MCV: 86.9 fL (ref 78.0–100.0)
Platelets: 177 10*3/uL (ref 150–400)
RBC: 3.66 MIL/uL — AB (ref 3.87–5.11)
RDW: 12.6 % (ref 11.5–15.5)
WBC: 6.7 10*3/uL (ref 4.0–10.5)

## 2016-09-24 LAB — URINALYSIS, ROUTINE W REFLEX MICROSCOPIC
BILIRUBIN URINE: NEGATIVE
Glucose, UA: NEGATIVE mg/dL
Hgb urine dipstick: NEGATIVE
Ketones, ur: NEGATIVE mg/dL
Leukocytes, UA: NEGATIVE
NITRITE: NEGATIVE
PH: 6.5 (ref 5.0–8.0)
Protein, ur: NEGATIVE mg/dL

## 2016-09-24 NOTE — Discharge Instructions (Signed)
Preventing Injuries During Pregnancy °Trauma is the most common cause of injury and death in pregnant women. This can also result in serious harm to the baby or even death. °How can injuries affect my pregnancy? °Your baby is protected in the womb (uterus) by a sac filled with fluid (amniotic sac). Your baby can be harmed if there is a direct blow to your abdomen and pelvis. Trauma may be caused by: °· Falls. These are more common in the second and third trimester of pregnancy. °· Automobile accidents. °· Domestic violence or assault. °· Severe burns, such as from fire or electricity. ° °These injuries can result in: °· Tearing of your uterus. °· The placenta pulling away from the wall of the uterus (placental abruption). °· The amniotic sac breaking open (rupture of membranes). °· Blockage or decrease in the blood supply to your baby. °· Going into labor earlier than expected. °· Severe injuries to other parts of your body, such as your brain, spine, heart, lungs, or other organs. ° °Minor falls and low-impact automobile accidents do not usually harm your baby, even if they cause a little harm to you. °What can I do to lower my risk? °Safety °· Remove slippery rugs and loose objects on the floor. They increase your risk of tripping or slipping. °· Wear comfortable shoes that have a good grip on the sole. Do not wear high-heeled shoes. °· Always wear your seat belt properly when riding in a car. Use both the lap and shoulder belt, with the lap belt below your abdomen. Always practice safe driving. Do not ride on a motorcycle while pregnant. °Activity °· Avoid walking on wet or slippery floors. °· Do not participate in rough and violent activities or sports. °· Avoid high-risk situations and activities such as: °? Lifting heavy pots of boiling or hot liquids. °? Fixing electrical problems. °? Being near fires or starting fires. °General instructions °· Take over-the-counter and prescription medicines only as told by  your health care provider. °· Know your blood type and the father's blood type in case you develop vaginal bleeding or experience an injury for which a blood transfusion is needed. °· Spousal abuse can be a serious cause of trauma during pregnancy. If you are a victim of domestic violence or assault: °? Call your local emergency services (911 in the [place] °? Contact the National Domestic Violence Hotline for help and support. °When should I seek immediate medical care? °Get help right away if: °· You fall on your abdomen or experience any serious blow to your abdomen. °· You develop stiffness in your neck or pain after a fall or from other trauma. °· You develop a headache or vision problems after a fall or from other trauma. °· You do not feel the baby moving after a fall or trauma, or you feel that the baby is not moving as much as before the fall or trauma. °· You have been the victim of domestic violence or any other kind of physical attack. °· You have been in a car accident. °· You develop vaginal bleeding. °· You have fluid leaking from the vagina. °· You develop uterine contractions. Symptoms include pelvic cramping, pain, or serious low back pain. °· You become weak, faint, or have uncontrolled vomiting after trauma. °· You have a serious burn. This includes burns to the face, neck, hands, or genitals, or burns greater than the size of your palm anywhere else. ° °Summary °· Trauma is the most common cause of   injury and death in pregnant women and can also lead to injury or death of the baby. °· Falls, automobile accidents, domestic violence or assault, and severe burns can injure you or your baby. Make sure to get medical help right away if you experience any of these during your pregnancy. °· Take steps to prevent slips or falls in your home, such as avoiding slippery floors and removing loose rugs. °· Always wear your seat belt properly when riding in a car. Practice safe driving. °This information is  not intended to replace advice given to you by your health care provider. Make sure you discuss any questions you have with your health care provider. °Document Released: 04/26/2004 Document Revised: 03/28/2016 Document Reviewed: 03/28/2016 °Elsevier Interactive Patient Education © 2017 [employer] ° °

## 2016-09-24 NOTE — MAU Provider Note (Signed)
Obstetric Attending MAU Note  Chief Complaint:  Fall   First Provider Initiated Contact with Patient 09/24/16 2024     HPI: Allison Moreno is a 31 y.o. G1P0 at [redacted]w[redacted]d who presents to maternity admissions reporting a fall due to being pulled down by her dog around 1915. Caught herself on hands and knees, abdomen grazed but did not hit the floor.  Denies contractions, leakage of fluid or vaginal bleeding. Good fetal movement.  Of note, was seen on 09/16/16 for fall that occurred while walking her dog, had negative evaluation here in MAU.  Pregnancy Course: Receives care at [facility] Patient Active Problem List   Diagnosis Date Noted  . Rubella non-immune status, antepartum 05/04/2016  . Supervision of normal pregnancy, antepartum 05/03/2016  . Adopted 05/03/2016  . Adjustment disorder with anxious mood 12/21/2014  . 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 ADD (attention deficit disorder)   . Chronic ear infection   . Depression   . 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 Recurrent subacute allergic otitis media of left ear 12/21/2014    OB History  Gravida Para Term Preterm AB Living  1            SAB TAB Ectopic Multiple Live Births               # Outcome Date GA Lbr Len/2nd Weight Sex Delivery Anes PTL Lv  1 Current               Past Surgical History:  Procedure Laterality Date  . TONSILLECTOMY    . TYMPANOPLASTY Right   . WISDOM TOOTH EXTRACTION      Family History: Family History  Problem Relation Age of Onset  . Depression Mother   . Diabetes Father     Social History: Social History  Substance Use Topics  . Smoking status: Never Smoker  . Smokeless tobacco: Never Used  . Alcohol use 0.0 oz/week    Allergies:  Allergies  Allergen Reactions  . Sulfa Antibiotics Other (See Comments)    Reaction:  Stevens-Johnson Syndrome     Prescriptions Prior to Admission  Medication  Sig Dispense Refill Last Dose  . Prenatal MV-Min-FA-Omega-3 (PRENATAL GUMMIES/DHA & FA) 0.4-32.5 MG CHEW Chew 2 each by mouth at bedtime.   09/15/2016 at Unknown time  . ranitidine (ZANTAC) 150 MG tablet Take 150-300 mg by mouth 2 (two) times daily as needed for heartburn.   09/16/2016 at Unknown time    ROS: Pertinent findings in history of present illness.  Physical Exam  Blood pressure 123/80, pulse 82, temperature 97.7 F (36.5 C), temperature source Oral, resp. rate 19, height 5\' 6"  (1.676 m), weight 230 lb (104.3 kg), last menstrual period 02/13/2016. CONSTITUTIONAL: Well-developed, well-nourished female in no acute distress.  HENT:  Normocephalic, atraumatic, External right and left ear normal. Oropharynx is clear and moist EYES: Conjunctivae and EOM are normal. Pupils are equal, round, and reactive to light. No scleral icterus.  NECK: Normal range of motion, supple, no masses SKIN: Skin is warm and dry. No rash noted. Not diaphoretic. No erythema. No pallor. NEUROLGIC: Alert and oriented to person, place, and time. Normal reflexes, muscle tone coordination. No cranial nerve deficit noted. PSYCHIATRIC: Normal mood and affect. Normal behavior. Normal judgment and thought content. CARDIOVASCULAR: Normal heart rate noted, regular rhythm RESPIRATORY: Effort and breath sounds normal, no problems with respiration noted ABDOMEN: Soft, nontender,  nondistended, gravid appropriate for gestational age PELVIC: NEFG MUSCULOSKELETAL: Normal range of motion. No edema and no tenderness. 2+ distal pulses.  FHT:  Baseline 140 , moderate variability, accelerations present, no decelerations Contractions: rare   Labs: Results for orders placed or performed during the hospital encounter of 09/24/16 (from the past 24 hour(s))  Protein / creatinine ratio, urine     Status: None   Collection Time: 09/24/16  8:20 PM  Result Value Ref Range   Creatinine, Urine 58.00 mg/dL   Total Protein, Urine <6 mg/dL    Protein Creatinine Ratio        0.00 - 0.15 mg/mg[Cre]  Urinalysis, Routine w reflex microscopic     Status: Abnormal   Collection Time: 09/24/16  8:20 PM  Result Value Ref Range   Color, Urine YELLOW YELLOW   APPearance CLEAR CLEAR   Specific Gravity, Urine <1.005 (L) 1.005 - 1.030   pH 6.5 5.0 - 8.0   Glucose, UA NEGATIVE NEGATIVE mg/dL   Hgb urine dipstick NEGATIVE NEGATIVE   Bilirubin Urine NEGATIVE NEGATIVE   Ketones, ur NEGATIVE NEGATIVE mg/dL   Protein, ur NEGATIVE NEGATIVE mg/dL   Nitrite NEGATIVE NEGATIVE   Leukocytes, UA NEGATIVE NEGATIVE  CBC     Status: Abnormal   Collection Time: 09/24/16  8:45 PM  Result Value Ref Range   WBC 6.7 4.0 - 10.5 K/uL   RBC 3.66 (L) 3.87 - 5.11 MIL/uL   Hemoglobin 11.0 (L) 12.0 - 15.0 g/dL   HCT 16.1 (L) 09.6 - 04.5 %   MCV 86.9 78.0 - 100.0 fL   MCH 30.1 26.0 - 34.0 pg   MCHC 34.6 30.0 - 36.0 g/dL   RDW 40.9 81.1 - 91.4 %   Platelets 177 150 - 400 K/uL  Comprehensive metabolic panel     Status: Abnormal   Collection Time: 09/24/16  8:45 PM  Result Value Ref Range   Sodium 137 135 - 145 mmol/L   Potassium 3.6 3.5 - 5.1 mmol/L   Chloride 108 101 - 111 mmol/L   CO2 22 22 - 32 mmol/L   Glucose, Bld 100 (H) 65 - 99 mg/dL   BUN 13 6 - 20 mg/dL   Creatinine, Ser 7.82 0.44 - 1.00 mg/dL   Calcium 8.8 (L) 8.9 - 10.3 mg/dL   Total Protein 6.3 (L) 6.5 - 8.1 g/dL   Albumin 2.9 (L) 3.5 - 5.0 g/dL   AST 23 15 - 41 U/L   ALT 26 14 - 54 U/L   Alkaline Phosphatase 68 38 - 126 U/L   Total Bilirubin 0.3 0.3 - 1.2 mg/dL   GFR calc non Af Amer >60 >60 mL/min   GFR calc Af Amer >60 >60 mL/min   Anion gap 7 5 - 15    Imaging:  [place] Mfm Ob Follow Up  Result Date: 09/06/2016 ----------------------------------------------------------------------  OBSTETRICS REPORT                      (Signed Final 09/06/2016 12:11 pm) ---------------------------------------------------------------------- Patient Info  ID #:       [id number]                          D.O.B.:   07-06-1985 (30 yrs)  Name:       Allison Moreno              Visit Date:  09/06/2016 11:23 am ---------------------------------------------------------------------- Performed By  Performed By:  Devin Vics             Tertiary Phy.:    [facility]                    [facility]  Attending:        Erle Crocker MD     Address:          [facility]                                                             [address]                                                             [place], [place]                                                             [address]  Referred By:      Billey Gosling                Location:         [facility]  PICKENS MD  Ref. Address:     [address]                    [place], [place]                    [address] ---------------------------------------------------------------------- Orders   #  Description                                 Code   1  [place] MFM OB FOLLOW UP                         60454.09  ----------------------------------------------------------------------   #  Ordered By               Order #        Accession #    Episode #   1  Tinnie Gens              [id number]      [id number]     [id number]  ---------------------------------------------------------------------- Indications   [redacted] weeks gestation of pregnancy                Z3A.29   Encounter for other antenatal screening        Z36.2   follow-up   Obesity complicating pregnancy, second         O99.212   trimester  ---------------------------------------------------------------------- OB History  Blood Type:             Height:  5'7"   Weight (lb):  225      BMI:   35.24  Gravidity:    1 ---------------------------------------------------------------------- Fetal Evaluation  Num Of Fetuses:     1  Fetal Heart         155  Rate(bpm):  Cardiac Activity:   Observed  Presentation:       Breech  Placenta:           Posterior, above cervical os  P. Cord Insertion:  Previously Visualized  Amniotic Fluid  AFI FV:      Subjectively within normal limits  AFI Sum(cm)     %Tile       Largest Pocket(cm)  11.67           26          3.53  RUQ(cm)       RLQ(cm)       LUQ(cm)        LLQ(cm)  2.41          2.95          2.78           3.53 ---------------------------------------------------------------------- Biometry  BPD:      72.6  mm     G. Age:  29w 1d         28  %    CI:        74.68   %   70 - 86  FL/HC:      20.1   %   19.6 - 20.8  HC:      266.6  mm     G. Age:  29w 0d         10  %    HC/AC:      1.08       0.99 - 1.21  AC:       248   mm     G. Age:  29w 0d         32  %    FL/BPD:     73.8   %   71 - 87  FL:       53.6  mm     G. Age:  28w 3d         13  %    FL/AC:      21.6   %   20 - 24  HUM:        50  mm     G. Age:  29w 2d         45  %  Est. FW:    1292  gm    2 lb 14 oz      40  % ---------------------------------------------------------------------- Gestational Age  LMP:           29w 3d       Date:   02/13/16                 EDD:   11/19/16  U/S Today:     28w 6d                                        EDD:   11/23/16  Best:          29w 3d    Det. By:   LMP  (02/13/16)          EDD:   11/19/16 ---------------------------------------------------------------------- Anatomy  Cranium:               Appears normal         Aortic Arch:            Appears normal  Cavum:                 Previously seen        Ductal Arch:            Previously seen  Ventricles:            Appears normal         Diaphragm:              Appears normal  Choroid Plexus:        Previously seen         Stomach:                Appears normal, left                                                                        sided  Cerebellum:  Previously seen        Abdomen:                Appears normal  Posterior Fossa:       Previously seen        Abdominal Wall:         Previously seen  Nuchal Fold:           Not applicable (>20    Cord Vessels:           Previously seen                         wks GA)  Face:                  Orbits and profile     Kidneys:                Appear normal                         previously seen  Lips:                  Previously seen        Bladder:                Appears normal  Thoracic:              Appears normal         Spine:                  Previously seen  Heart:                 Appears normal         Upper Extremities:      Previously seen                         (4CH, axis, and situs  RVOT:                  Appears normal         Lower Extremities:      Previously seen  LVOT:                  Previously seen  Other:  Female gender. Heels and 5th digit previously visualized. Technically          difficult due to maternal habitus and fetal position. ---------------------------------------------------------------------- Cervix Uterus Adnexa  Cervix  Length:           3.07  cm.  Normal appearance by transabdominal scan. ---------------------------------------------------------------------- Impression  Indication: 31 yr old G1P0 at 5715w3d for follow up ultrasound  to reevaluate fetal anatomy and placental location. Remote  read.  Findings:  1. Single intrauterine pregnancy.  2. Estimated fetal weight is in the 40th%.  3. Posterior placenta without evidence of previa.  4. Normal amniotic fluid volume.  5. Normal transabdominal cervical length.  6. The limited anatomy survey is normal; any anatomy not  evaluated on today's exam was evaluated on the previous  exam. ---------------------------------------------------------------------- Recommendations  1. Appropriate fetal  growth.  2. Fetal anatomic survey is complete.  3. Low lying placenta has resolved.  4. Recommend follow up ultrasounds as clinically indicated. ----------------------------------------------------------------------                Erle CrockerKristen H Quinn, MD Electronically Signed Final Report   09/06/2016 12:11  pm ----------------------------------------------------------------------   MAU Course: Reactive FHR tracing, no contractions on admission.  Will monitor until 2300. If stable, will discharge to home Patient remained stable, discharged to home.  Assessment: 1. Traumatic injury during pregnancy, first trimester     Plan: Discharge home Fall precautions reviewed.  Preterm labor precautions and fetal kick counts reviewed Follow up with OB provider as scheduled    Allergies as of 09/24/2016      Reactions   Sulfa Antibiotics Other (See Comments)   Reaction:  Stevens-Johnson Syndrome       Medication List    TAKE these medications   PRENATAL GUMMIES/DHA & FA 0.4-32.5 MG Chew Chew 2 each by mouth at bedtime.   ranitidine 150 MG tablet Commonly known as:  ZANTAC Take 150-300 mg by mouth 2 (two) times daily as needed for heartburn.       Tereso Newcomer, MD 09/24/2016 10:14 PM

## 2016-09-24 NOTE — MAU Note (Signed)
Patient fell as a result of being pulled down by her dog while on the leash around 1915.  Caught herself on her hands and knees but did feel her belly also contact the ground.  Reports still feeling baby move, no VB or LOF.  No CTX.

## 2016-09-26 ENCOUNTER — Ambulatory Visit (INDEPENDENT_AMBULATORY_CARE_PROVIDER_SITE_OTHER): Payer: Medicaid Other | Admitting: Family Medicine

## 2016-09-26 VITALS — BP 114/78 | HR 108 | Wt 237.0 lb

## 2016-09-26 DIAGNOSIS — O9989 Other specified diseases and conditions complicating pregnancy, childbirth and the puerperium: Secondary | ICD-10-CM

## 2016-09-26 DIAGNOSIS — F988 Other specified behavioral and emotional disorders with onset usually occurring in childhood and adolescence: Secondary | ICD-10-CM

## 2016-09-26 DIAGNOSIS — Z3483 Encounter for supervision of other normal pregnancy, third trimester: Secondary | ICD-10-CM

## 2016-09-26 DIAGNOSIS — Z0282 Encounter for adoption services: Secondary | ICD-10-CM

## 2016-09-26 DIAGNOSIS — Z3400 Encounter for supervision of normal first pregnancy, unspecified trimester: Secondary | ICD-10-CM

## 2016-09-26 DIAGNOSIS — O99343 Other mental disorders complicating pregnancy, third trimester: Secondary | ICD-10-CM

## 2016-09-26 DIAGNOSIS — F4322 Adjustment disorder with anxiety: Secondary | ICD-10-CM

## 2016-09-26 DIAGNOSIS — Z283 Underimmunization status: Secondary | ICD-10-CM

## 2016-09-26 DIAGNOSIS — Z2839 Other underimmunization status: Secondary | ICD-10-CM

## 2016-09-26 NOTE — Progress Notes (Signed)
   PRENATAL VISIT NOTE  Subjective:  Allison Moreno is a 31 y.o. G1P0 at 65w2dbeing seen today for ongoing prenatal care.  She is currently monitored for the following issues for this low-risk pregnancy and has Adjustment disorder with anxious mood; ADD (attention deficit disorder) without hyperactivity; Supervision of normal pregnancy, antepartum; Adopted; and Rubella non-immune status, antepartum on her problem list.  Patient reports no complaints.  Contractions: Not present. Vag. Bleeding: None.  Movement: Present. Denies leaking of fluid.   The following portions of the patient's history were reviewed and updated as appropriate: allergies, current medications, past family history, past medical history, past social history, past surgical history and problem list. Problem list updated.  Objective:   Vitals:   09/26/16 1605  BP: 114/78  Pulse: (!) 108  Weight: 237 lb (107.5 kg)    Fetal Status: Fetal Heart Rate (bpm): 149   Movement: Present     General:  Alert, oriented and cooperative. Patient is in no acute distress.  Skin: Skin is warm and dry. No rash noted.   Cardiovascular: Normal heart rate noted  Respiratory: Normal respiratory effort, no problems with respiration noted  Abdomen: Soft, gravid, appropriate for gestational age. Pain/Pressure: Present     Pelvic:  Cervical exam deferred        Extremities: Normal range of motion.  Edema: Trace  Mental Status: Normal mood and affect. Normal behavior. Normal judgment and thought content.   Assessment and Plan:  Pregnancy: G1P0 at 351w2d1. Adjustment disorder with anxious mood stable  2. ADD (attention deficit disorder) without hyperactivity No complaints  3. Supervision of normal first pregnancy, antepartum UTD Needs letter to allow her to go pee when she needs to at work Low lying placenta resolved  4. Rubella non-immune status, antepartum MMR PP  Preterm labor symptoms and general obstetric precautions  including but not limited to vaginal bleeding, contractions, leaking of fluid and fetal movement were reviewed in detail with the patient. Please refer to After Visit Summary for other counseling recommendations.  Return in about 4 weeks (around 10/24/2016) for Routine prenatal care.   KiCaren MacadamMD

## 2016-09-26 NOTE — Patient Instructions (Signed)
Breastfeeding Deciding to breastfeed is one of the best choices you can make for you and your baby. A change in hormones during pregnancy causes your breast tissue to grow and increases the number and size of your milk ducts. These hormones also allow proteins, sugars, and fats from your blood supply to make breast milk in your milk-producing glands. Hormones prevent breast milk from being released before your baby is born as well as prompt milk flow after birth. Once breastfeeding has begun, thoughts of your baby, as well as his or her sucking or crying, can stimulate the release of milk from your milk-producing glands. Benefits of breastfeeding For Your Baby  Your first milk (colostrum) helps your baby's digestive system function better.  There are antibodies in your milk that help your baby fight off infections.  Your baby has a lower incidence of asthma, allergies, and sudden infant death syndrome.  The nutrients in breast milk are better for your baby than infant formulas and are designed uniquely for your baby's needs.  Breast milk improves your baby's brain development.  Your baby is less likely to develop other conditions, such as childhood obesity, asthma, or type 2 diabetes mellitus.  For You  Breastfeeding helps to create a very special bond between you and your baby.  Breastfeeding is convenient. Breast milk is always available at the correct temperature and costs nothing.  Breastfeeding helps to burn calories and helps you lose the weight gained during pregnancy.  Breastfeeding makes your uterus contract to its prepregnancy size faster and slows bleeding (lochia) after you give birth.  Breastfeeding helps to lower your risk of developing type 2 diabetes mellitus, osteoporosis, and breast or ovarian cancer later in life.  Signs that your baby is hungry Early Signs of Hunger  Increased alertness or activity.  Stretching.  Movement of the head from side to  side.  Movement of the head and opening of the mouth when the corner of the mouth or cheek is stroked (rooting).  Increased sucking sounds, smacking lips, cooing, sighing, or squeaking.  Hand-to-mouth movements.  Increased sucking of fingers or hands.  Late Signs of Hunger  Fussing.  Intermittent crying.  Extreme Signs of Hunger Signs of extreme hunger will require calming and consoling before your baby will be able to breastfeed successfully. Do not wait for the following signs of extreme hunger to occur before you initiate breastfeeding:  Restlessness.  A loud, strong cry.  Screaming.  Breastfeeding basics Breastfeeding Initiation  Find a comfortable place to sit or lie down, with your neck and back well supported.  Place a pillow or rolled up blanket under your baby to bring him or her to the level of your breast (if you are seated). Nursing pillows are specially designed to help support your arms and your baby while you breastfeed.  Make sure that your baby's abdomen is facing your abdomen.  Gently massage your breast. With your fingertips, massage from your chest wall toward your nipple in a circular motion. This encourages milk flow. You may need to continue this action during the feeding if your milk flows slowly.  Support your breast with 4 fingers underneath and your thumb above your nipple. Make sure your fingers are well away from your nipple and your baby's mouth.  Stroke your baby's lips gently with your finger or nipple.  When your baby's mouth is open wide enough, quickly bring your baby to your breast, placing your entire nipple and as much of the colored area   around your nipple (areola) as possible into your baby's mouth. ? More areola should be visible above your baby's upper lip than below the lower lip. ? Your baby's tongue should be between his or her lower gum and your breast.  Ensure that your baby's mouth is correctly positioned around your nipple  (latched). Your baby's lips should create a seal on your breast and be turned out (everted).  It is common for your baby to suck about 2-3 minutes in order to start the flow of breast milk.  Latching Teaching your baby how to latch on to your breast properly is very important. An improper latch can cause nipple pain and decreased milk supply for you and poor weight gain in your baby. Also, if your baby is not latched onto your nipple properly, he or she may swallow some air during feeding. This can make your baby fussy. Burping your baby when you switch breasts during the feeding can help to get rid of the air. However, teaching your baby to latch on properly is still the best way to prevent fussiness from swallowing air while breastfeeding. Signs that your baby has successfully latched on to your nipple:  Silent tugging or silent sucking, without causing you pain.  Swallowing heard between every 3-4 sucks.  Muscle movement above and in front of his or her ears while sucking.  Signs that your baby has not successfully latched on to nipple:  Sucking sounds or smacking sounds from your baby while breastfeeding.  Nipple pain.  If you think your baby has not latched on correctly, slip your finger into the corner of your baby's mouth to break the suction and place it between your baby's gums. Attempt breastfeeding initiation again. Signs of Successful Breastfeeding Signs from your baby:  A gradual decrease in the number of sucks or complete cessation of sucking.  Falling asleep.  Relaxation of his or her body.  Retention of a small amount of milk in his or her mouth.  Letting go of your breast by himself or herself.  Signs from you:  Breasts that have increased in firmness, weight, and size 1-3 hours after feeding.  Breasts that are softer immediately after breastfeeding.  Increased milk volume, as well as a change in milk consistency and color by the fifth day of  breastfeeding.  Nipples that are not sore, cracked, or bleeding.  Signs That Your Baby is Getting Enough Milk  Wetting at least 1-2 diapers during the first 24 hours after birth.  Wetting at least 5-6 diapers every 24 hours for the first week after birth. The urine should be clear or pale yellow by 5 days after birth.  Wetting 6-8 diapers every 24 hours as your baby continues to grow and develop.  At least 3 stools in a 24-hour period by age 5 days. The stool should be soft and yellow.  At least 3 stools in a 24-hour period by age 7 days. The stool should be seedy and yellow.  No loss of weight greater than 10% of birth weight during the first 3 days of age.  Average weight gain of 4-7 ounces (113-198 g) per week after age 4 days.  Consistent daily weight gain by age 5 days, without weight loss after the age of 2 weeks.  After a feeding, your baby may spit up a small amount. This is common. Breastfeeding frequency and duration Frequent feeding will help you make more milk and can prevent sore nipples and breast engorgement. Breastfeed when   you feel the need to reduce the fullness of your breasts or when your baby shows signs of hunger. This is called "breastfeeding on demand." Avoid introducing a pacifier to your baby while you are working to establish breastfeeding (the first 4-6 weeks after your baby is born). After this time you may choose to use a pacifier. Research has shown that pacifier use during the first year of a baby's life decreases the risk of sudden infant death syndrome (SIDS). Allow your baby to feed on each breast as long as he or she wants. Breastfeed until your baby is finished feeding. When your baby unlatches or falls asleep while feeding from the first breast, offer the second breast. Because newborns are often sleepy in the first few weeks of life, you may need to awaken your baby to get him or her to feed. Breastfeeding times will vary from baby to baby. However,  the following rules can serve as a guide to help you ensure that your baby is properly fed:  Newborns (babies 4 weeks of age or younger) may breastfeed every 1-3 hours.  Newborns should not go longer than 3 hours during the day or 5 hours during the night without breastfeeding.  You should breastfeed your baby a minimum of 8 times in a 24-hour period until you begin to introduce solid foods to your baby at around 6 months of age.  Breast milk pumping Pumping and storing breast milk allows you to ensure that your baby is exclusively fed your breast milk, even at times when you are unable to breastfeed. This is especially important if you are going back to work while you are still breastfeeding or when you are not able to be present during feedings. Your lactation consultant can give you guidelines on how long it is safe to store breast milk. A breast pump is a machine that allows you to pump milk from your breast into a sterile bottle. The pumped breast milk can then be stored in a refrigerator or freezer. Some breast pumps are operated by hand, while others use electricity. Ask your lactation consultant which type will work best for you. Breast pumps can be purchased, but some hospitals and breastfeeding support groups lease breast pumps on a monthly basis. A lactation consultant can teach you how to hand express breast milk, if you prefer not to use a pump. Caring for your breasts while you breastfeed Nipples can become dry, cracked, and sore while breastfeeding. The following recommendations can help keep your breasts moisturized and healthy:  Avoid using soap on your nipples.  Wear a supportive bra. Although not required, special nursing bras and tank tops are designed to allow access to your breasts for breastfeeding without taking off your entire bra or top. Avoid wearing underwire-style bras or extremely tight bras.  Air dry your nipples for 3-4minutes after each feeding.  Use only cotton  bra pads to absorb leaked breast milk. Leaking of breast milk between feedings is normal.  Use lanolin on your nipples after breastfeeding. Lanolin helps to maintain your skin's normal moisture barrier. If you use pure lanolin, you do not need to wash it off before feeding your baby again. Pure lanolin is not toxic to your baby. You may also hand express a few drops of breast milk and gently massage that milk into your nipples and allow the milk to air dry.  In the first few weeks after giving birth, some women experience extremely full breasts (engorgement). Engorgement can make your   breasts feel heavy, warm, and tender to the touch. Engorgement peaks within 3-5 days after you give birth. The following recommendations can help ease engorgement:  Completely empty your breasts while breastfeeding or pumping. You may want to start by applying warm, moist heat (in the shower or with warm water-soaked hand towels) just before feeding or pumping. This increases circulation and helps the milk flow. If your baby does not completely empty your breasts while breastfeeding, pump any extra milk after he or she is finished.  Wear a snug bra (nursing or regular) or tank top for 1-2 days to signal your body to slightly decrease milk production.  Apply ice packs to your breasts, unless this is too uncomfortable for you.  Make sure that your baby is latched on and positioned properly while breastfeeding.  If engorgement persists after 48 hours of following these recommendations, contact your health care provider or a lactation consultant. Overall health care recommendations while breastfeeding  Eat healthy foods. Alternate between meals and snacks, eating 3 of each per day. Because what you eat affects your breast milk, some of the foods may make your baby more irritable than usual. Avoid eating these foods if you are sure that they are negatively affecting your baby.  Drink milk, fruit juice, and water to  satisfy your thirst (about 10 glasses a day).  Rest often, relax, and continue to take your prenatal vitamins to prevent fatigue, stress, and anemia.  Continue breast self-awareness checks.  Avoid chewing and smoking tobacco. Chemicals from cigarettes that pass into breast milk and exposure to secondhand smoke may harm your baby.  Avoid alcohol and drug use, including marijuana. Some medicines that may be harmful to your baby can pass through breast milk. It is important to ask your health care provider before taking any medicine, including all over-the-counter and prescription medicine as well as vitamin and herbal supplements. It is possible to become pregnant while breastfeeding. If birth control is desired, ask your health care provider about options that will be safe for your baby. Contact a health care provider if:  You feel like you want to stop breastfeeding or have become frustrated with breastfeeding.  You have painful breasts or nipples.  Your nipples are cracked or bleeding.  Your breasts are red, tender, or warm.  You have a swollen area on either breast.  You have a fever or chills.  You have nausea or vomiting.  You have drainage other than breast milk from your nipples.  Your breasts do not become full before feedings by the fifth day after you give birth.  You feel sad and depressed.  Your baby is too sleepy to eat well.  Your baby is having trouble sleeping.  Your baby is wetting less than 3 diapers in a 24-hour period.  Your baby has less than 3 stools in a 24-hour period.  Your baby's skin or the white part of his or her eyes becomes yellow.  Your baby is not gaining weight by 5 days of age. Get help right away if:  Your baby is overly tired (lethargic) and does not want to wake up and feed.  Your baby develops an unexplained fever. This information is not intended to replace advice given to you by your health care provider. Make sure you discuss  any questions you have with your health care provider. Document Released: 03/19/2005 Document Revised: 08/31/2015 Document Reviewed: 09/10/2012 [employer] Interactive Patient Education  2017 [employer]  

## 2016-09-27 ENCOUNTER — Encounter: Payer: Self-pay | Admitting: Student

## 2016-09-27 ENCOUNTER — Encounter (HOSPITAL_COMMUNITY): Payer: Self-pay | Admitting: *Deleted

## 2016-09-27 ENCOUNTER — Inpatient Hospital Stay (HOSPITAL_COMMUNITY): Payer: Medicaid Other

## 2016-09-27 ENCOUNTER — Inpatient Hospital Stay (HOSPITAL_COMMUNITY)
Admission: AD | Admit: 2016-09-27 | Discharge: 2016-09-30 | DRG: 781 | Disposition: A | Discharge status: Home / Self Care | Payer: Medicaid Other | Source: Ambulatory Visit | Attending: Obstetrics & Gynecology | Admitting: Obstetrics & Gynecology

## 2016-09-27 DIAGNOSIS — Z3A32 32 weeks gestation of pregnancy: Secondary | ICD-10-CM | POA: Diagnosis not present

## 2016-09-27 DIAGNOSIS — Z3400 Encounter for supervision of normal first pregnancy, unspecified trimester: Secondary | ICD-10-CM

## 2016-09-27 DIAGNOSIS — O99613 Diseases of the digestive system complicating pregnancy, third trimester: Principal | ICD-10-CM | POA: Diagnosis present

## 2016-09-27 DIAGNOSIS — K37 Unspecified appendicitis: Secondary | ICD-10-CM | POA: Diagnosis present

## 2016-09-27 DIAGNOSIS — K3580 Unspecified acute appendicitis: Secondary | ICD-10-CM | POA: Diagnosis not present

## 2016-09-27 DIAGNOSIS — R109 Unspecified abdominal pain: Secondary | ICD-10-CM | POA: Diagnosis not present

## 2016-09-27 DIAGNOSIS — O26893 Other specified pregnancy related conditions, third trimester: Secondary | ICD-10-CM

## 2016-09-27 LAB — CBC WITH DIFFERENTIAL/PLATELET
BASOS ABS: 0 10*3/uL (ref 0.0–0.1)
BASOS PCT: 0 %
EOS PCT: 0 %
Eosinophils Absolute: 0 10*3/uL (ref 0.0–0.7)
HCT: 36 % (ref 36.0–46.0)
Hemoglobin: 12.3 g/dL (ref 12.0–15.0)
Lymphocytes Relative: 8 %
Lymphs Abs: 1.2 10*3/uL (ref 0.7–4.0)
MCH: 29.4 pg (ref 26.0–34.0)
MCHC: 34.2 g/dL (ref 30.0–36.0)
MCV: 85.9 fL (ref 78.0–100.0)
MONO ABS: 1.1 10*3/uL — AB (ref 0.1–1.0)
Monocytes Relative: 7 %
Neutro Abs: 13.4 10*3/uL — ABNORMAL HIGH (ref 1.7–7.7)
Neutrophils Relative %: 85 %
PLATELETS: 172 10*3/uL (ref 150–400)
RBC: 4.19 MIL/uL (ref 3.87–5.11)
RDW: 12.6 % (ref 11.5–15.5)
WBC: 15.7 10*3/uL — ABNORMAL HIGH (ref 4.0–10.5)

## 2016-09-27 LAB — CBC
HEMATOCRIT: 36.4 % (ref 36.0–46.0)
HEMOGLOBIN: 12.2 g/dL (ref 12.0–15.0)
MCH: 28.9 pg (ref 26.0–34.0)
MCHC: 33.5 g/dL (ref 30.0–36.0)
MCV: 86.3 fL (ref 78.0–100.0)
Platelets: 181 10*3/uL (ref 150–400)
RBC: 4.22 MIL/uL (ref 3.87–5.11)
RDW: 12.5 % (ref 11.5–15.5)
WBC: 13.8 10*3/uL — ABNORMAL HIGH (ref 4.0–10.5)

## 2016-09-27 LAB — COMPREHENSIVE METABOLIC PANEL
ALK PHOS: 73 U/L (ref 38–126)
ALT: 26 U/L (ref 14–54)
AST: 23 U/L (ref 15–41)
Albumin: 3.1 g/dL — ABNORMAL LOW (ref 3.5–5.0)
Anion gap: 8 (ref 5–15)
BUN: 11 mg/dL (ref 6–20)
CALCIUM: 8.9 mg/dL (ref 8.9–10.3)
CO2: 21 mmol/L — AB (ref 22–32)
CREATININE: 0.78 mg/dL (ref 0.44–1.00)
Chloride: 106 mmol/L (ref 101–111)
GFR calc non Af Amer: >60 mL/min (ref >60)
Glucose, Bld: 87 mg/dL (ref 65–99)
Potassium: 3.7 mmol/L (ref 3.5–5.1)
SODIUM: 135 mmol/L (ref 135–145)
Total Bilirubin: 0.3 mg/dL (ref 0.3–1.2)
Total Protein: 6.7 g/dL (ref 6.5–8.1)

## 2016-09-27 LAB — URINALYSIS, COMPLETE (UACMP) WITH MICROSCOPIC
BILIRUBIN URINE: NEGATIVE
GLUCOSE, UA: NEGATIVE mg/dL
HGB URINE DIPSTICK: NEGATIVE
KETONES UR: 80 mg/dL — AB
Leukocytes, UA: NEGATIVE
NITRITE: NEGATIVE
PROTEIN: NEGATIVE mg/dL
Specific Gravity, Urine: 1.023 (ref 1.005–1.030)
pH: 5 (ref 5.0–8.0)

## 2016-09-27 LAB — LIPASE, BLOOD: Lipase: 22 U/L (ref 11–51)

## 2016-09-27 LAB — PROTEIN / CREATININE RATIO, URINE
Creatinine, Urine: 185 mg/dL
Protein Creatinine Ratio: 0.1 mg/mg{Cre} (ref 0.00–0.15)
TOTAL PROTEIN, URINE: 19 mg/dL

## 2016-09-27 LAB — AMYLASE: Amylase: 81 U/L (ref 28–100)

## 2016-09-27 MED ORDER — PROMETHAZINE HCL 25 MG/ML IJ SOLN
25.0000 mg | Freq: Once | INTRAMUSCULAR | Status: AC
  Administered 2016-09-27: 25 mg via INTRAVENOUS
  Filled 2016-09-27: qty 1

## 2016-09-27 MED ORDER — SODIUM CHLORIDE 0.9 % IV BOLUS (SEPSIS)
500.0000 mL | Freq: Once | INTRAVENOUS | Status: AC
  Administered 2016-09-27: 500 mL via INTRAVENOUS

## 2016-09-27 MED ORDER — OXYCODONE-ACETAMINOPHEN 5-325 MG PO TABS
1.0000 | ORAL_TABLET | Freq: Once | ORAL | Status: AC
  Administered 2016-09-27: 1 via ORAL
  Filled 2016-09-27: qty 1

## 2016-09-27 MED ORDER — GI COCKTAIL ~~LOC~~
30.0000 mL | Freq: Once | ORAL | Status: DC
  Filled 2016-09-27: qty 30

## 2016-09-27 MED ORDER — HYDROMORPHONE HCL 1 MG/ML IJ SOLN
1.0000 mg | Freq: Once | INTRAMUSCULAR | Status: AC
  Administered 2016-09-27: 1 mg via INTRAVENOUS
  Filled 2016-09-27: qty 1

## 2016-09-27 NOTE — MAU Note (Addendum)
Patient arrives by EMS she states she fell on Saturday but did not hit her abdomen.  She was monitored on Sunday [facility]

## 2016-09-27 NOTE — MAU Provider Note (Signed)
Patient Allison Moreno is a 31 y.o. G1P0 here with complaints of abdominal pain that started at 0700 this morning. The pain lasted from 7 to 12PM, and then she started having lower right-sided abdominal pain at 1:30pm that got worse over the course of the afternoon. She tried laying down and using a heating pad; she called EMS and was brought in for evaluation.  History     CSN: [id number]  Arrival date and time: 09/27/16 1752   None     Chief Complaint  Patient presents with  . Abdominal Pain   Abdominal Pain  This is a new problem. The current episode started today. The onset quality is sudden. The problem occurs constantly. The problem has been gradually worsening. The pain is located in the epigastric region and RLQ. The pain is at a severity of 6/10. The quality of the pain is cramping. Pain radiation: radiates to her right groin. Associated symptoms include diarrhea and vomiting. Pertinent negatives include no dysuria. Exacerbated by: moving her right leg. The pain is relieved by nothing. Treatments tried: heat and rest. The treatment provided no relief.    OB History    Gravida Para Term Preterm AB Living   1             SAB TAB Ectopic Multiple Live Births                  Past Medical History:  Diagnosis Date  . ADD (attention deficit disorder)   . Chronic ear infection   . Depression   . 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 TONSILLECTOMY    . TYMPANOPLASTY Right   . WISDOM TOOTH EXTRACTION      Family History  Problem Relation Age of Onset  . Depression Mother   . Diabetes Father     Social History  Substance Use Topics  . Smoking status: Never Smoker  . Smokeless tobacco: Never Used  . Alcohol use 0.0 oz/week    Allergies:  Allergies  Allergen Reactions  . Sulfa Antibiotics Other (See Comments)    Reaction:   Stevens-Johnson Syndrome     Prescriptions Prior to Admission  Medication Sig Dispense Refill Last Dose  . acetaminophen (TYLENOL) 500 MG chewable tablet Chew 1,500 mg by mouth every 6 (six) hours as needed for pain.   09/27/2016 at Unknown time  . Prenatal MV-Min-FA-Omega-3 (PRENATAL GUMMIES/DHA & FA) 0.4-32.5 MG CHEW Chew 2 each by mouth at bedtime.   09/26/2016 at Unknown time  . ranitidine (ZANTAC) 150 MG tablet Take 150-300 mg by mouth 2 (two) times daily as needed for heartburn.   Past Week at Unknown time    Review of Systems  Respiratory: Negative.   Cardiovascular: Negative.   Gastrointestinal: Positive for abdominal pain, diarrhea and vomiting.       Green and watery; she had stomach cramps with it at 6:40 am.   She has been nauseated all day; she threw up in MAU and then said she felt better. She feels like after she threw up she felt better.   Genitourinary: Negative for dysuria, vaginal bleeding and vaginal discharge.  Musculoskeletal: Negative.   Neurological: Negative.    Physical Exam   Blood pressure 139/90, pulse 97, temperature 97.7 F (36.5 C), temperature source Oral, resp. rate (!) 22, height 5\' 6"  (1.676 m), weight 237  lb (107.5 kg), last menstrual period 02/13/2016, SpO2 98 %.  Physical Exam  Constitutional: She is oriented to person, place, and time.  HENT:  Head: Atraumatic.  Respiratory: Effort normal.  GI: Soft. There is tenderness.  Right lower quadrant tenderness, no abdominal rigidity, rebound tendenress or guarding  Musculoskeletal: Normal range of motion.  Neurological: She is alert and oriented to person, place, and time. She has normal reflexes.  Skin: Skin is warm and dry.  Psychiatric: She has a normal mood and affect.   Results for orders placed or performed during the hospital encounter of 09/27/16 (from the past 24 hour(s))  Protein / creatinine ratio, urine     Status: None   Collection Time: 09/27/16  6:15 PM  Result Value Ref Range    Creatinine, Urine 185.00 mg/dL   Total Protein, Urine 19 mg/dL   Protein Creatinine Ratio 0.10 0.00 - 0.15 mg/mg[Cre]  Urinalysis, Complete w Microscopic     Status: Abnormal   Collection Time: 09/27/16  6:15 PM  Result Value Ref Range   Color, Urine YELLOW YELLOW   APPearance CLEAR CLEAR   Specific Gravity, Urine 1.023 1.005 - 1.030   pH 5.0 5.0 - 8.0   Glucose, UA NEGATIVE NEGATIVE mg/dL   Hgb urine dipstick NEGATIVE NEGATIVE   Bilirubin Urine NEGATIVE NEGATIVE   Ketones, ur 80 (A) NEGATIVE mg/dL   Protein, ur NEGATIVE NEGATIVE mg/dL   Nitrite NEGATIVE NEGATIVE   Leukocytes, UA NEGATIVE NEGATIVE   RBC / HPF 0-5 0 - 5 RBC/hpf   WBC, UA 0-5 0 - 5 WBC/hpf   Bacteria, UA RARE (A) NONE SEEN   Squamous Epithelial / LPF 0-5 (A) NONE SEEN   Mucous PRESENT   CBC     Status: Abnormal   Collection Time: 09/27/16  6:19 PM  Result Value Ref Range   WBC 13.8 (H) 4.0 - 10.5 K/uL   RBC 4.22 3.87 - 5.11 MIL/uL   Hemoglobin 12.2 12.0 - 15.0 g/dL   HCT 16.136.4 09.636.0 - 04.546.0 %   MCV 86.3 78.0 - 100.0 fL   MCH 28.9 26.0 - 34.0 pg   MCHC 33.5 30.0 - 36.0 g/dL   RDW 40.912.5 81.111.5 - 91.415.5 %   Platelets 181 150 - 400 K/uL  Comprehensive metabolic panel     Status: Abnormal   Collection Time: 09/27/16  6:19 PM  Result Value Ref Range   Sodium 135 135 - 145 mmol/L   Potassium 3.7 3.5 - 5.1 mmol/L   Chloride 106 101 - 111 mmol/L   CO2 21 (L) 22 - 32 mmol/L   Glucose, Bld 87 65 - 99 mg/dL   BUN 11 6 - 20 mg/dL   Creatinine, Ser 7.820.78 0.44 - 1.00 mg/dL   Calcium 8.9 8.9 - 95.610.3 mg/dL   Total Protein 6.7 6.5 - 8.1 g/dL   Albumin 3.1 (L) 3.5 - 5.0 g/dL   AST 23 15 - 41 U/L   ALT 26 14 - 54 U/L   Alkaline Phosphatase 73 38 - 126 U/L   Total Bilirubin 0.3 0.3 - 1.2 mg/dL   GFR calc non Af Amer >60 >60 mL/min   GFR calc Af Amer >60 >60 mL/min   Anion gap 8 5 - 15  Amylase     Status: None   Collection Time: 09/27/16  6:19 PM  Result Value Ref Range   Amylase 81 28 - 100 U/L  Lipase, blood     Status:  None   Collection  Time: 09/27/16  6:19 PM  Result Value Ref Range   Lipase 22 11 - 51 U/L  CBC with Differential/Platelet     Status: Abnormal   Collection Time: 09/27/16  9:12 PM  Result Value Ref Range   WBC 15.7 (H) 4.0 - 10.5 K/uL   RBC 4.19 3.87 - 5.11 MIL/uL   Hemoglobin 12.3 12.0 - 15.0 g/dL   HCT 98.1 19.1 - 47.8 %   MCV 85.9 78.0 - 100.0 fL   MCH 29.4 26.0 - 34.0 pg   MCHC 34.2 30.0 - 36.0 g/dL   RDW 29.5 62.1 - 30.8 %   Platelets 172 150 - 400 K/uL   Neutrophils Relative % 85 %   Neutro Abs 13.4 (H) 1.7 - 7.7 K/uL   Lymphocytes Relative 8 %   Lymphs Abs 1.2 0.7 - 4.0 K/uL   Monocytes Relative 7 %   Monocytes Absolute 1.1 (H) 0.1 - 1.0 K/uL   Eosinophils Relative 0 %   Eosinophils Absolute 0.0 0.0 - 0.7 K/uL   Basophils Relative 0 %   Basophils Absolute 0.0 0.0 - 0.1 K/uL   Mr Pelvis Wo Contrast  Result Date: 09/28/2016 CLINICAL DATA:  Right-sided abdominal pain, patient is 30+ weeks pregnant EXAM: MRI ABDOMEN AND PELVIS WITHOUT CONTRAST TECHNIQUE: Multiplanar multisequence MR imaging of the abdomen and pelvis was performed. No intravenous contrast was administered. COMPARISON:  Ultrasound 09/06/2016 FINDINGS: COMBINED FINDINGS FOR BOTH MR ABDOMEN AND PELVIS Of note the examination was not tailored to evaluate the fetal anatomy or provided detailed assessment of the placenta. Lower chest: No acute findings. Hepatobiliary: No mass or other parenchymal abnormality identified. No stones in the gallbladder. No gallbladder wall thickening Pancreas: No mass, inflammatory changes, or other parenchymal abnormality identified. Spleen:  Within normal limits in size and appearance. Adrenals/Urinary Tract: No masses identified. No evidence of hydronephrosis. Stomach/Bowel: Visualized stomach within normal limits. No dilated small bowel. Blind-ending tubular structure in the right lower quadrant, series 12, image number 79 measuring up to 13 mm suspicious for dilated appendix. Hypointense  foci in the tip and proximal lumen may represent appendicolith. Not much surrounding edema but there is a small amount of free fluid in the right lower quadrant. Vascular/Lymphatic: Non aneurysmal aorta. No significantly enlarged lymph nodes Reproductive: Single intrauterine gestation.  Posterior placenta. Other:  None. Musculoskeletal: No abnormal marrow signal IMPRESSION: 1. Dilated blind-ending tubular structure in the right lower quadrant measuring up to 13 mm suspicious for dilated appendix/acute appendicitis. Although not much inflammation in the right lower quadrant there is a small amount of free fluid. Hypointense foci in the tip and proximal lumen may represent appendicolith. Critical Value/emergent results were called by telephone at the time of interpretation on 09/28/2016 at 3:40 am to Dr. Thressa Sheller , who verbally acknowledged these results. Electronically Signed   By: Jasmine Pang M.D.   On: 09/28/2016 03:40   Mr Abdomen Wo Contrast  Result Date: 09/28/2016 CLINICAL DATA:  Right-sided abdominal pain, patient is 30+ weeks pregnant EXAM: MRI ABDOMEN AND PELVIS WITHOUT CONTRAST TECHNIQUE: Multiplanar multisequence MR imaging of the abdomen and pelvis was performed. No intravenous contrast was administered. COMPARISON:  Ultrasound 09/06/2016 FINDINGS: COMBINED FINDINGS FOR BOTH MR ABDOMEN AND PELVIS Of note the examination was not tailored to evaluate the fetal anatomy or provided detailed assessment of the placenta. Lower chest: No acute findings. Hepatobiliary: No mass or other parenchymal abnormality identified. No stones in the gallbladder. No gallbladder wall thickening Pancreas: No mass, inflammatory changes, or  other parenchymal abnormality identified. Spleen:  Within normal limits in size and appearance. Adrenals/Urinary Tract: No masses identified. No evidence of hydronephrosis. Stomach/Bowel: Visualized stomach within normal limits. No dilated small bowel. Blind-ending tubular structure  in the right lower quadrant, series 12, image number 79 measuring up to 13 mm suspicious for dilated appendix. Hypointense foci in the tip and proximal lumen may represent appendicolith. Not much surrounding edema but there is a small amount of free fluid in the right lower quadrant. Vascular/Lymphatic: Non aneurysmal aorta. No significantly enlarged lymph nodes Reproductive: Single intrauterine gestation.  Posterior placenta. Other:  None. Musculoskeletal: No abnormal marrow signal IMPRESSION: 1. Dilated blind-ending tubular structure in the right lower quadrant measuring up to 13 mm suspicious for dilated appendix/acute appendicitis. Although not much inflammation in the right lower quadrant there is a small amount of free fluid. Hypointense foci in the tip and proximal lumen may represent appendicolith. Critical Value/emergent results were called by telephone at the time of interpretation on 09/28/2016 at 3:40 am to Dr. Thressa Sheller , who verbally acknowledged these results. Electronically Signed   By: Jasmine Pang M.D.   On: 09/28/2016 03:40   US Abdomen Limited Ruq  Result Date: 09/27/2016 CLINICAL DATA:  Acute right upper quadrant pain at [redacted] weeks gestation. EXAM: ULTRASOUND ABDOMEN LIMITED RIGHT UPPER QUADRANT COMPARISON:  None. FINDINGS: Gallbladder: No gallstones or gallbladder wall thickening. No pericholecystic fluid. The sonographer reports no sonographic Murphy's sign. Common bile duct: Diameter: 4 mm, within normal limits. Liver: No focal lesion identified. Within normal limits in parenchymal echogenicity. IMPRESSION: Normal right upper quadrant ultrasound. Electronically Signed   By: Kennith Center M.D.   On: 09/27/2016 20:06    MAU Course  Procedures  MDM  CBC: elevated white count (from 6 to 13 to 15);  Blood pressures labile in MAU, from normal to 140/90 CMP normal; UPC 0.10 UA : 80 of ketones Percocet for pain; pain now down from 9/10 to 4/10  CT with contrast Patient care  endorsed to [facility] at 2155 Radiology recommending MRI without contrast v CT abd/pelvis.   2252: D/W Dr. Dalene Seltzer at [facility] ED, patient is not a transfer. She will be coming over for MRI, and then returning back to [facility] for results.   4098: D/W Dr. Erin Fulling, will call surgery on call.   [phone] D/W Dr. Gerrit Friends with general surgery. He would like to have the patient admitted, kept NPO and started on Zosyn. He or his partner will come by in the morning to see the patient. Concern for preterm labor and delivery after surgery Assessment and Plan   1. [redacted] weeks gestation of pregnancy   2. Abdominal pain during pregnancy in third trimester   3. Abdominal pain   4. Acute appendicitis, unspecified acute appendicitis type    Admit to antenatal  Zosyn per pharmacy Pain management as needed BMZ x2 for fetal lung maturity due to potential risk of preterm labor and delivery if surgery is done.   Thressa Sheller 4:07 AM 09/28/16   Charlesetta Garibaldi Kooistra 09/27/2016, 8:37 PM

## 2016-09-27 NOTE — ED Provider Notes (Signed)
30yo female being sent by MAU for MRI abdomen to evaluate for appendicitis.  MAU provider reports she is coming only for MRI and [facility] and that [facility] will follow up on MRI results and plan.   Alvira MondaySchlossman, Muzammil Bruins, MD 09/27/16 [phone]

## 2016-09-27 NOTE — Telephone Encounter (Signed)
Spoke to pt via phone.  States she is having constant tightening in the top of her abdomen.  + fetal movement and denies any bleeding or leaking of fluid.  Informed pt that at times the baby can position itself where it may cause some discomfort and feelings of pressure.  Encouraged to push fluids and hydrate as well as rest and to take Tylenol as needed.  If she experiences any bleeding, decreased movement, leaking of fluid, or if the pain continues and increases in severity to go to MAU for evaluation.  Pt acknowledged instructions.

## 2016-09-27 NOTE — MAU Note (Signed)
Patient states she had upper right abdominal pain starting this morning.  The pain has now moved to her lower right abdomen.  She describes it as sharp shooting abdominal pain.

## 2016-09-28 ENCOUNTER — Inpatient Hospital Stay (HOSPITAL_COMMUNITY): Payer: Medicaid Other | Admitting: Anesthesiology

## 2016-09-28 ENCOUNTER — Encounter (HOSPITAL_COMMUNITY)
Admission: AD | Disposition: A | Discharge status: Home / Self Care | Payer: Self-pay | Source: Ambulatory Visit | Attending: Obstetrics & Gynecology

## 2016-09-28 ENCOUNTER — Inpatient Hospital Stay (HOSPITAL_COMMUNITY): Payer: Medicaid Other

## 2016-09-28 ENCOUNTER — Encounter (HOSPITAL_COMMUNITY): Payer: Self-pay | Admitting: Anesthesiology

## 2016-09-28 DIAGNOSIS — O26893 Other specified pregnancy related conditions, third trimester: Secondary | ICD-10-CM | POA: Diagnosis not present

## 2016-09-28 DIAGNOSIS — K37 Unspecified appendicitis: Secondary | ICD-10-CM

## 2016-09-28 DIAGNOSIS — K3580 Unspecified acute appendicitis: Secondary | ICD-10-CM | POA: Diagnosis present

## 2016-09-28 DIAGNOSIS — R109 Unspecified abdominal pain: Secondary | ICD-10-CM | POA: Diagnosis present

## 2016-09-28 DIAGNOSIS — Z3A32 32 weeks gestation of pregnancy: Secondary | ICD-10-CM | POA: Diagnosis not present

## 2016-09-28 DIAGNOSIS — O99613 Diseases of the digestive system complicating pregnancy, third trimester: Secondary | ICD-10-CM | POA: Diagnosis present

## 2016-09-28 HISTORY — DX: Unspecified appendicitis: K37

## 2016-09-28 HISTORY — PX: APPENDECTOMY: SHX54

## 2016-09-28 LAB — TYPE AND SCREEN
ABO/RH(D): O POS
ABO/RH(D): O POS
ANTIBODY SCREEN: NEGATIVE
Antibody Screen: NEGATIVE

## 2016-09-28 LAB — ABO/RH: ABO/RH(D): O POS

## 2016-09-28 SURGERY — APPENDECTOMY
Anesthesia: General | Site: Abdomen

## 2016-09-28 MED ORDER — PIPERACILLIN-TAZOBACTAM 3.375 G IVPB
3.3750 g | Freq: Three times a day (TID) | INTRAVENOUS | Status: DC
  Administered 2016-09-28 (×2): 3.375 g via INTRAVENOUS
  Filled 2016-09-28 (×3): qty 50

## 2016-09-28 MED ORDER — HYDROMORPHONE HCL 1 MG/ML IJ SOLN
1.0000 mg | INTRAMUSCULAR | Status: DC | PRN
  Administered 2016-09-28 (×2): 1 mg via INTRAVENOUS
  Filled 2016-09-28 (×2): qty 1

## 2016-09-28 MED ORDER — CALCIUM CARBONATE ANTACID 500 MG PO CHEW
2.0000 | CHEWABLE_TABLET | ORAL | Status: DC | PRN
  Administered 2016-09-29: 400 mg via ORAL
  Filled 2016-09-28: qty 2

## 2016-09-28 MED ORDER — PIPERACILLIN-TAZOBACTAM 3.375 G IVPB
3.3750 g | Freq: Three times a day (TID) | INTRAVENOUS | Status: AC
  Administered 2016-09-28 – 2016-09-29 (×2): 3.375 g via INTRAVENOUS
  Filled 2016-09-28 (×2): qty 50

## 2016-09-28 MED ORDER — PROMETHAZINE HCL 25 MG/ML IJ SOLN
12.5000 mg | Freq: Four times a day (QID) | INTRAMUSCULAR | Status: DC | PRN
  Administered 2016-09-28: 25 mg via INTRAVENOUS
  Filled 2016-09-28: qty 1

## 2016-09-28 MED ORDER — SOD CITRATE-CITRIC ACID 500-334 MG/5ML PO SOLN
30.0000 mL | Freq: Once | ORAL | Status: AC
  Administered 2016-09-28: 15 mL via ORAL

## 2016-09-28 MED ORDER — PRENATAL MULTIVITAMIN CH
1.0000 | ORAL_TABLET | Freq: Every day | ORAL | Status: DC
  Filled 2016-09-28: qty 1

## 2016-09-28 MED ORDER — FENTANYL CITRATE (PF) 100 MCG/2ML IJ SOLN
25.0000 ug | INTRAMUSCULAR | Status: DC | PRN
  Administered 2016-09-28 (×2): 50 ug via INTRAVENOUS

## 2016-09-28 MED ORDER — SUCCINYLCHOLINE CHLORIDE 200 MG/10ML IV SOSY
PREFILLED_SYRINGE | INTRAVENOUS | Status: DC | PRN
  Administered 2016-09-28: 140 mg via INTRAVENOUS

## 2016-09-28 MED ORDER — ROCURONIUM BROMIDE 10 MG/ML (PF) SYRINGE
PREFILLED_SYRINGE | INTRAVENOUS | Status: DC | PRN
  Administered 2016-09-28: 5 mg via INTRAVENOUS
  Administered 2016-09-28: 30 mg via INTRAVENOUS

## 2016-09-28 MED ORDER — PHENYLEPHRINE 40 MCG/ML (10ML) SYRINGE FOR IV PUSH (FOR BLOOD PRESSURE SUPPORT)
PREFILLED_SYRINGE | INTRAVENOUS | Status: DC | PRN
  Administered 2016-09-28: 80 ug via INTRAVENOUS

## 2016-09-28 MED ORDER — ATROPINE SULFATE 0.4 MG/ML IJ SOLN
INTRAMUSCULAR | Status: DC | PRN
  Administered 2016-09-28: .6 mg via INTRAVENOUS

## 2016-09-28 MED ORDER — HYDROMORPHONE HCL 1 MG/ML IJ SOLN
0.5000 mg | INTRAMUSCULAR | Status: DC | PRN
  Administered 2016-09-28 – 2016-09-29 (×9): 1 mg via INTRAVENOUS
  Administered 2016-09-30: 0.5 mg via INTRAVENOUS
  Filled 2016-09-28 (×10): qty 1

## 2016-09-28 MED ORDER — LIDOCAINE 2% (20 MG/ML) 5 ML SYRINGE
INTRAMUSCULAR | Status: DC | PRN
  Administered 2016-09-28: 20 mg via INTRAVENOUS

## 2016-09-28 MED ORDER — PROPOFOL 10 MG/ML IV BOLUS
INTRAVENOUS | Status: DC | PRN
  Administered 2016-09-28: 200 mg via INTRAVENOUS

## 2016-09-28 MED ORDER — ZOLPIDEM TARTRATE 5 MG PO TABS: 5.0000 mg | ORAL_TABLET | Freq: Every evening | ORAL | Status: DC | PRN

## 2016-09-28 MED ORDER — SOD CITRATE-CITRIC ACID 500-334 MG/5ML PO SOLN
ORAL | Status: AC
  Filled 2016-09-28: qty 15

## 2016-09-28 MED ORDER — BUPIVACAINE HCL (PF) 0.5 % IJ SOLN
INTRAMUSCULAR | Status: DC | PRN
  Administered 2016-09-28: 30 mL

## 2016-09-28 MED ORDER — 0.9 % SODIUM CHLORIDE (POUR BTL) OPTIME
TOPICAL | Status: DC | PRN
  Administered 2016-09-28: 3000 mL

## 2016-09-28 MED ORDER — BETAMETHASONE SOD PHOS & ACET 6 (3-3) MG/ML IJ SUSP
12.0000 mg | INTRAMUSCULAR | Status: AC
  Administered 2016-09-28 – 2016-09-29 (×2): 12 mg via INTRAMUSCULAR
  Filled 2016-09-28 (×2): qty 2

## 2016-09-28 MED ORDER — FENTANYL CITRATE (PF) 100 MCG/2ML IJ SOLN
INTRAMUSCULAR | Status: DC | PRN
  Administered 2016-09-28 (×4): 50 ug via INTRAVENOUS
  Administered 2016-09-28 (×2): 25 ug via INTRAVENOUS

## 2016-09-28 MED ORDER — FENTANYL CITRATE (PF) 100 MCG/2ML IJ SOLN
INTRAMUSCULAR | Status: AC
  Administered 2016-09-28: 50 ug via INTRAVENOUS
  Filled 2016-09-28: qty 2

## 2016-09-28 MED ORDER — LACTATED RINGERS IV SOLN
INTRAVENOUS | Status: DC
  Administered 2016-09-28 (×3): via INTRAVENOUS

## 2016-09-28 MED ORDER — LACTATED RINGERS IV SOLN
INTRAVENOUS | Status: DC
  Administered 2016-09-28 – 2016-09-29 (×3): via INTRAVENOUS

## 2016-09-28 MED ORDER — NEOSTIGMINE METHYLSULFATE 10 MG/10ML IV SOLN
INTRAVENOUS | Status: DC | PRN
  Administered 2016-09-28: 3 mg via INTRAVENOUS

## 2016-09-28 MED ORDER — ONDANSETRON HCL 4 MG/2ML IJ SOLN
INTRAMUSCULAR | Status: DC | PRN
  Administered 2016-09-28: 4 mg via INTRAVENOUS

## 2016-09-28 MED ORDER — DOCUSATE SODIUM 100 MG PO CAPS
100.0000 mg | ORAL_CAPSULE | Freq: Every day | ORAL | Status: DC
  Filled 2016-09-28: qty 1

## 2016-09-28 MED ORDER — PROMETHAZINE HCL 25 MG/ML IJ SOLN: 6.2500 mg | INTRAMUSCULAR | Status: DC | PRN

## 2016-09-28 MED ORDER — ACETAMINOPHEN 325 MG PO TABS: 650.0000 mg | ORAL_TABLET | ORAL | Status: DC | PRN

## 2016-09-28 SURGICAL SUPPLY — 43 items
BENZOIN TINCTURE PRP APPL 2/3 (GAUZE/BANDAGES/DRESSINGS) ×3 IMPLANT
BLADE EXTENDED COATED 6.5IN (ELECTRODE) IMPLANT
BLADE HEX COATED 2.75 (ELECTRODE) IMPLANT
CHLORAPREP W/TINT 26ML (MISCELLANEOUS) ×3 IMPLANT
CLOSURE WOUND 1/2 X4 (GAUZE/BANDAGES/DRESSINGS) ×1
DISSECTOR ROUND CHERRY 3/8 STR (MISCELLANEOUS) IMPLANT
DRAIN CHANNEL RND F F (WOUND CARE) IMPLANT
DRAPE LAPAROTOMY TRNSV 102X78 (DRAPE) IMPLANT
ELECT REM PT RETURN 15FT ADLT (MISCELLANEOUS) ×3 IMPLANT
EVACUATOR DRAINAGE 10X20 100CC (DRAIN) IMPLANT
EVACUATOR SILICONE 100CC (DRAIN)
GAUZE SPONGE 4X4 12PLY STRL (GAUZE/BANDAGES/DRESSINGS) ×3 IMPLANT
GAUZE SPONGE 4X4 16PLY XRAY LF (GAUZE/BANDAGES/DRESSINGS) IMPLANT
GLOVE BIOGEL PI IND STRL 7.0 (GLOVE) ×1 IMPLANT
GLOVE BIOGEL PI INDICATOR 7.0 (GLOVE) ×2
GLOVE ECLIPSE 8.0 STRL XLNG CF (GLOVE) ×3 IMPLANT
GLOVE INDICATOR 8.0 STRL GRN (GLOVE) ×6 IMPLANT
GOWN STRL REUS W/TWL LRG LVL3 (GOWN DISPOSABLE) ×3 IMPLANT
GOWN STRL REUS W/TWL XL LVL3 (GOWN DISPOSABLE) ×6 IMPLANT
HANDLE SUCTION POOLE (INSTRUMENTS) ×1 IMPLANT
KIT BASIN OR (CUSTOM PROCEDURE TRAY) ×3 IMPLANT
LIGASURE IMPACT 36 18CM CVD LR (INSTRUMENTS) ×3 IMPLANT
NS IRRIG 1000ML POUR BTL (IV SOLUTION) ×3 IMPLANT
PACK GENERAL/GYN (CUSTOM PROCEDURE TRAY) ×3 IMPLANT
RELOAD STAPLER LINEAR PROX 30 (STAPLE) ×1 IMPLANT
SPONGE LAP 4X18 X RAY DECT (DISPOSABLE) ×3 IMPLANT
STAPLER RELOAD LINEAR PROX 30 (STAPLE) ×3
STAPLER VISISTAT 35W (STAPLE) IMPLANT
STRIP CLOSURE SKIN 1/2X4 (GAUZE/BANDAGES/DRESSINGS) ×2 IMPLANT
SUCTION POOLE HANDLE (INSTRUMENTS) ×3
SUT ETHILON 4 0 PS 2 18 (SUTURE) IMPLANT
SUT PDS AB 1 CT1 27 (SUTURE) ×6 IMPLANT
SUT SILK 2 0 (SUTURE) ×2
SUT SILK 2-0 18XBRD TIE 12 (SUTURE) ×1 IMPLANT
SUT SILK 3 0 (SUTURE) ×2
SUT SILK 3 0 SH 30 (SUTURE) IMPLANT
SUT SILK 3 0 SH CR/8 (SUTURE) ×3 IMPLANT
SUT SILK 3-0 18XBRD TIE 12 (SUTURE) ×1 IMPLANT
TAPE CLOTH SURG 4X10 WHT LF (GAUZE/BANDAGES/DRESSINGS) ×3 IMPLANT
TOWEL OR 17X26 10 PK STRL BLUE (TOWEL DISPOSABLE) ×3 IMPLANT
TOWEL OR NON WOVEN STRL DISP B (DISPOSABLE) ×3 IMPLANT
TRAY FOLEY W/METER SILVER 14FR (SET/KITS/TRAYS/PACK) ×3 IMPLANT
TRAY FOLEY W/METER SILVER 16FR (SET/KITS/TRAYS/PACK) IMPLANT

## 2016-09-28 NOTE — Transfer of Care (Signed)
Immediate Anesthesia Transfer of Care Note  Patient: Allison Moreno  Procedure(s) Performed: Procedure(s): APPENDECTOMY (N/A)  Patient Location: PACU  Anesthesia Type:General  Level of Consciousness: awake  Airway & Oxygen Therapy: Patient Spontanous Breathing and Patient connected to face mask oxygen  Post-op Assessment: Report given to RN and Post -op Vital signs reviewed and stable  Post vital signs: Reviewed and stable  Last Vitals:  Vitals:   09/28/16 0800 09/28/16 1311  BP: 120/76 (P) 137/89  Pulse: 99 (P) 83  Resp: 18 (P) 14  Temp: 36.5 C (P) 36.9 C    Last Pain:  Vitals:   09/28/16 0905  TempSrc:   PainSc: 8       Patients Stated Pain Goal: 0 (09/27/16 1805)  Complications: No apparent anesthesia complications

## 2016-09-28 NOTE — Progress Notes (Signed)
Patient ID: Allison Moreno, female   DOB: 06/29/1985, 31 y.o.   MRN: [id number] Subjective: Pt was admitted overnight with c/o RUQ pain that migrated to her RLQ. She presented with N/V and diarrhea which has resooved. She cont to c/o pain in her left abd.  Pt is [redacted] weeks pregnant. She reports +FM, no LOF no ctx  Patient reports no problems voiding.    Objective: I have reviewed patient's vital signs, intake and output, medications, labs and radiology results. Pt was asleep through much of the conversation. Her mother did most of the speaking. Pt was appropriate with her response and in NAD   General: alert and mild distress Resp: clear to auscultation bilaterally Cardio: regular rate and rhythm, S1, S2 normal, no murmur, click, rub or gallop GI: gravid; tender to palpation diffusely. Tenderness greastest on the left side Extremities: extremities normal, atraumatic, no cyanosis or edema  Results for orders placed or performed during the hospital encounter of 09/27/16 (from the past 24 hour(s))  Protein / creatinine ratio, urine     Status: None   Collection Time: 09/27/16  6:15 PM  Result Value Ref Range   Creatinine, Urine 185.00 mg/dL   Total Protein, Urine 19 mg/dL   Protein Creatinine Ratio 0.10 0.00 - 0.15 mg/mg[Cre]  Urinalysis, Complete w Microscopic     Status: Abnormal   Collection Time: 09/27/16  6:15 PM  Result Value Ref Range   Color, Urine YELLOW YELLOW   APPearance CLEAR CLEAR   Specific Gravity, Urine 1.023 1.005 - 1.030   pH 5.0 5.0 - 8.0   Glucose, UA NEGATIVE NEGATIVE mg/dL   Hgb urine dipstick NEGATIVE NEGATIVE   Bilirubin Urine NEGATIVE NEGATIVE   Ketones, ur 80 (A) NEGATIVE mg/dL   Protein, ur NEGATIVE NEGATIVE mg/dL   Nitrite NEGATIVE NEGATIVE   Leukocytes, UA NEGATIVE NEGATIVE   RBC / HPF 0-5 0 - 5 RBC/hpf   WBC, UA 0-5 0 - 5 WBC/hpf   Bacteria, UA RARE (A) NONE SEEN   Squamous Epithelial / LPF 0-5 (A) NONE SEEN   Mucous PRESENT   CBC     Status: Abnormal    Collection Time: 09/27/16  6:19 PM  Result Value Ref Range   WBC 13.8 (H) 4.0 - 10.5 K/uL   RBC 4.22 3.87 - 5.11 MIL/uL   Hemoglobin 12.2 12.0 - 15.0 g/dL   HCT 40.936.4 81.136.0 - 91.446.0 %   MCV 86.3 78.0 - 100.0 fL   MCH 28.9 26.0 - 34.0 pg   MCHC 33.5 30.0 - 36.0 g/dL   RDW 78.212.5 95.611.5 - 21.315.5 %   Platelets 181 150 - 400 K/uL  Comprehensive metabolic panel     Status: Abnormal   Collection Time: 09/27/16  6:19 PM  Result Value Ref Range   Sodium 135 135 - 145 mmol/L   Potassium 3.7 3.5 - 5.1 mmol/L   Chloride 106 101 - 111 mmol/L   CO2 21 (L) 22 - 32 mmol/L   Glucose, Bld 87 65 - 99 mg/dL   BUN 11 6 - 20 mg/dL   Creatinine, Ser 0.860.78 0.44 - 1.00 mg/dL   Calcium 8.9 8.9 - 57.810.3 mg/dL   Total Protein 6.7 6.5 - 8.1 g/dL   Albumin 3.1 (L) 3.5 - 5.0 g/dL   AST 23 15 - 41 U/L   ALT 26 14 - 54 U/L   Alkaline Phosphatase 73 38 - 126 U/L   Total Bilirubin 0.3 0.3 - 1.2 mg/dL   GFR  calc non Af Amer >60 >60 mL/min   GFR calc Af Amer >60 >60 mL/min   Anion gap 8 5 - 15  Amylase     Status: None   Collection Time: 09/27/16  6:19 PM  Result Value Ref Range   Amylase 81 28 - 100 U/L  Lipase, blood     Status: None   Collection Time: 09/27/16  6:19 PM  Result Value Ref Range   Lipase 22 11 - 51 U/L  CBC with Differential/Platelet     Status: Abnormal   Collection Time: 09/27/16  9:12 PM  Result Value Ref Range   WBC 15.7 (H) 4.0 - 10.5 K/uL   RBC 4.19 3.87 - 5.11 MIL/uL   Hemoglobin 12.3 12.0 - 15.0 g/dL   HCT 16.1 09.6 - 04.5 %   MCV 85.9 78.0 - 100.0 fL   MCH 29.4 26.0 - 34.0 pg   MCHC 34.2 30.0 - 36.0 g/dL   RDW 40.9 81.1 - 91.4 %   Platelets 172 150 - 400 K/uL   Neutrophils Relative % 85 %   Neutro Abs 13.4 (H) 1.7 - 7.7 K/uL   Lymphocytes Relative 8 %   Lymphs Abs 1.2 0.7 - 4.0 K/uL   Monocytes Relative 7 %   Monocytes Absolute 1.1 (H) 0.1 - 1.0 K/uL   Eosinophils Relative 0 %   Eosinophils Absolute 0.0 0.0 - 0.7 K/uL   Basophils Relative 0 %   Basophils Absolute 0.0 0.0 - 0.1  K/uL  Type and screen [facility]     Status: None   Collection Time: 09/28/16  5:37 AM  Result Value Ref Range   ABO/RH(D) O POS    Antibody Screen NEG    Sample Expiration 10/01/2016   Mr Pelvis Wo Contrast  Result Date: 09/28/2016 CLINICAL DATA:  Right-sided abdominal pain, patient is 30+ weeks pregnant EXAM: MRI ABDOMEN AND PELVIS WITHOUT CONTRAST TECHNIQUE: Multiplanar multisequence MR imaging of the abdomen and pelvis was performed. No intravenous contrast was administered. COMPARISON:  Ultrasound 09/06/2016 FINDINGS: COMBINED FINDINGS FOR BOTH MR ABDOMEN AND PELVIS Of note the examination was not tailored to evaluate the fetal anatomy or provided detailed assessment of the placenta. Lower chest: No acute findings. Hepatobiliary: No mass or other parenchymal abnormality identified. No stones in the gallbladder. No gallbladder wall thickening Pancreas: No mass, inflammatory changes, or other parenchymal abnormality identified. Spleen:  Within normal limits in size and appearance. Adrenals/Urinary Tract: No masses identified. No evidence of hydronephrosis. Stomach/Bowel: Visualized stomach within normal limits. No dilated small bowel. Blind-ending tubular structure in the right lower quadrant, series 12, image number 79 measuring up to 13 mm suspicious for dilated appendix. Hypointense foci in the tip and proximal lumen may represent appendicolith. Not much surrounding edema but there is a small amount of free fluid in the right lower quadrant. Vascular/Lymphatic: Non aneurysmal aorta. No significantly enlarged lymph nodes Reproductive: Single intrauterine gestation.  Posterior placenta. Other:  None. Musculoskeletal: No abnormal marrow signal IMPRESSION: 1. Dilated blind-ending tubular structure in the right lower quadrant measuring up to 13 mm suspicious for dilated appendix/acute appendicitis. Although not much inflammation in the right lower quadrant there is a small amount of  free fluid. Hypointense foci in the tip and proximal lumen may represent appendicolith. Critical Value/emergent results were called by telephone at the time of interpretation on 09/28/2016 at 3:40 am to Dr. Thressa Sheller , who verbally acknowledged these results. Electronically Signed   By: Adrian Prows.D.  On: 09/28/2016 03:40   Mr Abdomen Wo Contrast  Result Date: 09/28/2016 CLINICAL DATA:  Right-sided abdominal pain, patient is 30+ weeks pregnant EXAM: MRI ABDOMEN AND PELVIS WITHOUT CONTRAST TECHNIQUE: Multiplanar multisequence MR imaging of the abdomen and pelvis was performed. No intravenous contrast was administered. COMPARISON:  Ultrasound 09/06/2016 FINDINGS: COMBINED FINDINGS FOR BOTH MR ABDOMEN AND PELVIS Of note the examination was not tailored to evaluate the fetal anatomy or provided detailed assessment of the placenta. Lower chest: No acute findings. Hepatobiliary: No mass or other parenchymal abnormality identified. No stones in the gallbladder. No gallbladder wall thickening Pancreas: No mass, inflammatory changes, or other parenchymal abnormality identified. Spleen:  Within normal limits in size and appearance. Adrenals/Urinary Tract: No masses identified. No evidence of hydronephrosis. Stomach/Bowel: Visualized stomach within normal limits. No dilated small bowel. Blind-ending tubular structure in the right lower quadrant, series 12, image number 79 measuring up to 13 mm suspicious for dilated appendix. Hypointense foci in the tip and proximal lumen may represent appendicolith. Not much surrounding edema but there is a small amount of free fluid in the right lower quadrant. Vascular/Lymphatic: Non aneurysmal aorta. No significantly enlarged lymph nodes Reproductive: Single intrauterine gestation.  Posterior placenta. Other:  None. Musculoskeletal: No abnormal marrow signal IMPRESSION: 1. Dilated blind-ending tubular structure in the right lower quadrant measuring up to 13 mm suspicious for  dilated appendix/acute appendicitis. Although not much inflammation in the right lower quadrant there is a small amount of free fluid. Hypointense foci in the tip and proximal lumen may represent appendicolith. Critical Value/emergent results were called by telephone at the time of interpretation on 09/28/2016 at 3:40 am to Dr. Thressa Sheller , who verbally acknowledged these results. Electronically Signed   By: Jasmine Pang M.D.   On: 09/28/2016 03:40   Assessment/Plan: 32 week IUP with suspicion of appendicitis. Gen Surg has been consulted. They will transfer pt to [facility] for OR at 1045. Pt has been NPO. Pt will be transferred back to [facility] for post op care. I have explained to them the risk of PTL due to surg. Pt is s/p BMZ x1 Pt was given 1 dose of Zosyn IVPB.  Of note: the mother expressed concern that she had called earlier and was told that she had round ligament pain.  She was also concerned that it has taken so long to get a dx. I have explained to her that appendicitis in pregnancy does not present as it does outside and some of her sx seemed to point to other dx. The mother is upset that an OB will not be operating with the Gen surg. I explained to her that if gen surg needs [place] we will scrub but, that it is not indicated. She reports dissatisfaction with the MAU. I spoke to her for and reported that I would have someone come and speak to her further from the MAU..     LOS: 0 days    Willodean Rosenthal 09/28/2016, 9:50 AM

## 2016-09-28 NOTE — Progress Notes (Signed)
Pharmacy Antibiotic Note  Allison Moreno is a 31 y.o. female G1P0 at 3832 weeks EGA; admitted on 09/27/2016 with abdominal pain and elevated WBCs. MRI suggests acute appendicitis.General surgery consulted.  Pharmacy has been consulted for Zosyn dosing.  Plan: Zosyn 3.375 Gm IV every 8 hours  Height: 5\' 6"  (167.6 cm) Weight: 237 lb (107.5 kg) IBW/kg (Calculated) : 59.3  Temp (24hrs), Avg:97.9 F (36.6 C), Min:97.7 F (36.5 C), Max:98.1 F (36.7 C)   Recent Labs Lab 09/24/16 2045 09/27/16 1819 09/27/16 2112  WBC 6.7 13.8* 15.7*  CREATININE 0.70 0.78  --     Estimated Creatinine Clearance: 127.6 mL/min (by C-G formula based on SCr of 0.78 mg/dL).    Allergies  Allergen Reactions  . Sulfa Antibiotics Other (See Comments)    Reaction:  Stevens-Johnson Syndrome     Antimicrobials this admission: Zosyn 3.375 Gm 09/28/16>>  Dose adjustments this admission: N/A   Thank you for allowing pharmacy to be a part of this patient's care.  Allison Moreno, Allison Moreno 09/28/2016 4:25 AM

## 2016-09-28 NOTE — Anesthesia Preprocedure Evaluation (Addendum)
Anesthesia Evaluation  Patient identified by MRN, date of birth, ID band Patient awake    Reviewed: Allergy & Precautions, NPO status , Patient's Chart, lab work & pertinent test results  Airway Mallampati: II  TM Distance: >3 FB Neck ROM: Full    Dental  (+) Dental Advisory Given   Pulmonary neg pulmonary ROS,    breath sounds clear to auscultation       Cardiovascular negative cardio ROS   Rhythm:Regular Rate:Normal     Neuro/Psych negative neurological ROS     GI/Hepatic Neg liver ROS, GERD  ,Acute appendicitis   Endo/Other  negative endocrine ROS  Renal/GU negative Renal ROS     Musculoskeletal   Abdominal   Peds  Hematology negative hematology ROS (+)   Anesthesia Other Findings   Reproductive/Obstetrics (+) Pregnancy                            Lab Results  Component Value Date   WBC 15.7 (H) 09/27/2016   HGB 12.3 09/27/2016   HCT 36.0 09/27/2016   MCV 85.9 09/27/2016   PLT 172 09/27/2016   Lab Results  Component Value Date   CREATININE 0.78 09/27/2016   BUN 11 09/27/2016   NA 135 09/27/2016   K 3.7 09/27/2016   CL 106 09/27/2016   CO2 21 (L) 09/27/2016    Anesthesia Physical Anesthesia Plan  ASA: III  Anesthesia Plan: General   Post-op Pain Management:    Induction: Intravenous and Rapid sequence  PONV Risk Score and Plan: 4 or greater and Ondansetron, Dexamethasone, Propofol, Scopolamine patch - Pre-op and Treatment may vary due to age or medical condition  Airway Management Planned:   Additional Equipment:   Intra-op Plan:   Post-operative Plan: Extubation in OR  Informed Consent: I have reviewed the patients History and Physical, chart, labs and discussed the procedure including the risks, benefits and alternatives for the proposed anesthesia with the patient or authorized representative who has indicated his/her understanding and acceptance.    Dental advisory given  Plan Discussed with: CRNA  Anesthesia Plan Comments:         Anesthesia Quick Evaluation

## 2016-09-28 NOTE — Anesthesia Postprocedure Evaluation (Signed)
Anesthesia Post Note  Patient: Allison Moreno  Procedure(s) Performed: Procedure(s) (LRB): APPENDECTOMY (N/A)     Patient location during evaluation: PACU Anesthesia Type: General Level of consciousness: awake and alert Pain management: pain level controlled Vital Signs Assessment: post-procedure vital signs reviewed and stable Respiratory status: spontaneous breathing, nonlabored ventilation, respiratory function stable and patient connected to nasal cannula oxygen Cardiovascular status: blood pressure returned to baseline and stable Postop Assessment: no signs of nausea or vomiting Anesthetic complications: no    Last Vitals:  Vitals:   09/28/16 1415 09/28/16 1500  BP: 128/78 110/64  Pulse: 89 76  Resp: 17 18  Temp: 36.9 C     Last Pain:  Vitals:   09/28/16 1415  TempSrc:   PainSc: 8                  Kennieth RadFitzgerald, Sadrac Zeoli E

## 2016-09-28 NOTE — Consult Note (Signed)
Reason for Consult:  Acute appendicitis Referring Physician: Dr. [name] is an 31 y.o. female.  HPI: This is a 31 year old female who awoke yesterday morning with epigastric pain.  It then radiated down to the right lower quadrant later in the afternoon and progress.  He has been here.  She had some vomiting in the hospital.  She was sent for an MRI which is consistent with acute appendicitis caused by a appendicolith.  I have discussed this with the radiologist.  We were asked to see here in consultation for further evaluation and treatment.  Her mother is in the room with her.  She is 32-4/[redacted] week pregnant.  She reports she has not had  Past Medical History:  Diagnosis Date  . ADD (attention deficit disorder)   . Chronic ear infection   . Depression   . 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 TONSILLECTOMY    . TYMPANOPLASTY Right   . WISDOM TOOTH EXTRACTION      Family History  Problem Relation Age of Onset  . Depression Mother   . Diabetes Father     Social History:  reports that she has never smoked. She has never used smokeless tobacco. She reports that she drinks alcohol. She reports that she does not use drugs.  Allergies:  Allergies  Allergen Reactions  . Sulfa Antibiotics Other (See Comments)    Reaction:  Stevens-Johnson Syndrome     Current Facility-Administered Medications:  .  acetaminophen (TYLENOL) tablet 650 mg, 650 mg, Oral, Q4H PRN, Tresea Mall, CNM .  betamethasone acetate-betamethasone sodium phosphate (CELESTONE) injection 12 mg, 12 mg, Intramuscular, Q24H, Marcille Buffy D, CNM, 12 mg at 09/28/16 0507 .  calcium carbonate (TUMS - dosed in mg elemental calcium) chewable tablet 400 mg of elemental calcium, 2 tablet, Oral, Q4H PRN, Tresea Mall, CNM .  docusate sodium (COLACE) capsule 100 mg, 100 mg,  Oral, Daily, Tresea Mall, CNM, Stopped at 09/28/16 [phone] .  HYDROmorphone (DILAUDID) injection 1 mg, 1 mg, Intravenous, Q3H PRN, Marcille Buffy D, CNM, 1 mg at 09/28/16 0906 .  lactated ringers infusion, , Intravenous, Continuous, Tresea Mall, CNM, Last Rate: 125 mL/hr at 09/28/16 0506 .  piperacillin-tazobactam (ZOSYN) IVPB 3.375 g, 3.375 g, Intravenous, Q8H, Tresea Mall, CNM, Stopped at 09/28/16 [phone] .  prenatal multivitamin tablet 1 tablet, 1 tablet, Oral, Q1200, Tresea Mall, CNM .  promethazine (PHENERGAN) injection 12.5-25 mg, 12.5-25 mg, Intravenous, Q6H PRN, Tresea Mall, CNM, 25 mg at 09/28/16 0418 .  zolpidem (AMBIEN) tablet 5 mg, 5 mg, Oral, QHS PRN, Tresea Mall, CNM   Results for orders placed or performed during the hospital encounter of 09/27/16 (from the past 48 hour(s))  Protein / creatinine ratio, urine     Status: None   Collection Time: 09/27/16  6:15 PM  Result Value Ref Range   Creatinine, Urine 185.00 mg/dL   Total Protein, Urine 19 mg/dL    Comment: NO NORMAL RANGE ESTABLISHED FOR THIS TEST   Protein Creatinine Ratio 0.10 0.00 - 0.15 mg/mg[Cre]  Urinalysis, Complete w Microscopic     Status: Abnormal   Collection Time: 09/27/16  6:15 PM  Result Value Ref Range   Color, Urine YELLOW YELLOW   APPearance CLEAR CLEAR   Specific Gravity, Urine 1.023 1.005 - 1.030   pH  5.0 5.0 - 8.0   Glucose, UA NEGATIVE NEGATIVE mg/dL   Hgb urine dipstick NEGATIVE NEGATIVE   Bilirubin Urine NEGATIVE NEGATIVE   Ketones, ur 80 (A) NEGATIVE mg/dL   Protein, ur NEGATIVE NEGATIVE mg/dL   Nitrite NEGATIVE NEGATIVE   Leukocytes, UA NEGATIVE NEGATIVE   RBC / HPF 0-5 0 - 5 RBC/hpf   WBC, UA 0-5 0 - 5 WBC/hpf   Bacteria, UA RARE (A) NONE SEEN   Squamous Epithelial / LPF 0-5 (A) NONE SEEN   Mucous PRESENT   CBC     Status: Abnormal   Collection Time: 09/27/16  6:19 PM  Result Value Ref Range   WBC 13.8 (H) 4.0 - 10.5 K/uL   RBC 4.22 3.87 - 5.11 MIL/uL    Hemoglobin 12.2 12.0 - 15.0 g/dL   HCT 36.4 36.0 - 46.0 %   MCV 86.3 78.0 - 100.0 fL   MCH 28.9 26.0 - 34.0 pg   MCHC 33.5 30.0 - 36.0 g/dL   RDW 12.5 11.5 - 15.5 %   Platelets 181 150 - 400 K/uL  Comprehensive metabolic panel     Status: Abnormal   Collection Time: 09/27/16  6:19 PM  Result Value Ref Range   Sodium 135 135 - 145 mmol/L   Potassium 3.7 3.5 - 5.1 mmol/L   Chloride 106 101 - 111 mmol/L   CO2 21 (L) 22 - 32 mmol/L   Glucose, Bld 87 65 - 99 mg/dL   BUN 11 6 - 20 mg/dL   Creatinine, Ser 0.78 0.44 - 1.00 mg/dL   Calcium 8.9 8.9 - 10.3 mg/dL   Total Protein 6.7 6.5 - 8.1 g/dL   Albumin 3.1 (L) 3.5 - 5.0 g/dL   AST 23 15 - 41 U/L   ALT 26 14 - 54 U/L   Alkaline Phosphatase 73 38 - 126 U/L   Total Bilirubin 0.3 0.3 - 1.2 mg/dL   GFR calc non Af Amer >60 >60 mL/min   GFR calc Af Amer >60 >60 mL/min    Comment: (NOTE) The eGFR has been calculated using the CKD EPI equation. This calculation has not been validated in all clinical situations. eGFR's persistently <60 mL/min signify possible Chronic Kidney Disease.    Anion gap 8 5 - 15  Amylase     Status: None   Collection Time: 09/27/16  6:19 PM  Result Value Ref Range   Amylase 81 28 - 100 U/L  Lipase, blood     Status: None   Collection Time: 09/27/16  6:19 PM  Result Value Ref Range   Lipase 22 11 - 51 U/L  CBC with Differential/Platelet     Status: Abnormal   Collection Time: 09/27/16  9:12 PM  Result Value Ref Range   WBC 15.7 (H) 4.0 - 10.5 K/uL   RBC 4.19 3.87 - 5.11 MIL/uL   Hemoglobin 12.3 12.0 - 15.0 g/dL   HCT 36.0 36.0 - 46.0 %   MCV 85.9 78.0 - 100.0 fL   MCH 29.4 26.0 - 34.0 pg   MCHC 34.2 30.0 - 36.0 g/dL   RDW 12.6 11.5 - 15.5 %   Platelets 172 150 - 400 K/uL   Neutrophils Relative % 85 %   Neutro Abs 13.4 (H) 1.7 - 7.7 K/uL   Lymphocytes Relative 8 %   Lymphs Abs 1.2 0.7 - 4.0 K/uL   Monocytes Relative 7 %   Monocytes Absolute 1.1 (H) 0.1 - 1.0 K/uL   Eosinophils Relative 0 %  Eosinophils Absolute 0.0 0.0 - 0.7 K/uL   Basophils Relative 0 %   Basophils Absolute 0.0 0.0 - 0.1 K/uL  Type and screen [place]     Status: None   Collection Time: 09/28/16  5:37 AM  Result Value Ref Range   ABO/RH(D) O POS    Antibody Screen NEG    Sample Expiration 10/01/2016     Mr Pelvis Wo Contrast  Result Date: 09/28/2016 CLINICAL DATA:  Right-sided abdominal pain, patient is 30+ weeks pregnant EXAM: MRI ABDOMEN AND PELVIS WITHOUT CONTRAST TECHNIQUE: Multiplanar multisequence MR imaging of the abdomen and pelvis was performed. No intravenous contrast was administered. COMPARISON:  Ultrasound 09/06/2016 FINDINGS: COMBINED FINDINGS FOR BOTH MR ABDOMEN AND PELVIS Of note the examination was not tailored to evaluate the fetal anatomy or provided detailed assessment of the placenta. Lower chest: No acute findings. Hepatobiliary: No mass or other parenchymal abnormality identified. No stones in the gallbladder. No gallbladder wall thickening Pancreas: No mass, inflammatory changes, or other parenchymal abnormality identified. Spleen:  Within normal limits in size and appearance. Adrenals/Urinary Tract: No masses identified. No evidence of hydronephrosis. Stomach/Bowel: Visualized stomach within normal limits. No dilated small bowel. Blind-ending tubular structure in the right lower quadrant, series 12, image number 79 measuring up to 13 mm suspicious for dilated appendix. Hypointense foci in the tip and proximal lumen may represent appendicolith. Not much surrounding edema but there is a small amount of free fluid in the right lower quadrant. Vascular/Lymphatic: Non aneurysmal aorta. No significantly enlarged lymph nodes Reproductive: Single intrauterine gestation.  Posterior placenta. Other:  None. Musculoskeletal: No abnormal marrow signal IMPRESSION: 1. Dilated blind-ending tubular structure in the right lower quadrant measuring up to 13 mm suspicious for dilated  appendix/acute appendicitis. Although not much inflammation in the right lower quadrant there is a small amount of free fluid. Hypointense foci in the tip and proximal lumen may represent appendicolith. Critical Value/emergent results were called by telephone at the time of interpretation on 09/28/2016 at 3:40 am to Dr. Marcille Buffy , who verbally acknowledged these results. Electronically Signed   By: Donavan Foil M.D.   On: 09/28/2016 03:40   Mr Abdomen Wo Contrast  Result Date: 09/28/2016 CLINICAL DATA:  Right-sided abdominal pain, patient is 30+ weeks pregnant EXAM: MRI ABDOMEN AND PELVIS WITHOUT CONTRAST TECHNIQUE: Multiplanar multisequence MR imaging of the abdomen and pelvis was performed. No intravenous contrast was administered. COMPARISON:  Ultrasound 09/06/2016 FINDINGS: COMBINED FINDINGS FOR BOTH MR ABDOMEN AND PELVIS Of note the examination was not tailored to evaluate the fetal anatomy or provided detailed assessment of the placenta. Lower chest: No acute findings. Hepatobiliary: No mass or other parenchymal abnormality identified. No stones in the gallbladder. No gallbladder wall thickening Pancreas: No mass, inflammatory changes, or other parenchymal abnormality identified. Spleen:  Within normal limits in size and appearance. Adrenals/Urinary Tract: No masses identified. No evidence of hydronephrosis. Stomach/Bowel: Visualized stomach within normal limits. No dilated small bowel. Blind-ending tubular structure in the right lower quadrant, series 12, image number 79 measuring up to 13 mm suspicious for dilated appendix. Hypointense foci in the tip and proximal lumen may represent appendicolith. Not much surrounding edema but there is a small amount of free fluid in the right lower quadrant. Vascular/Lymphatic: Non aneurysmal aorta. No significantly enlarged lymph nodes Reproductive: Single intrauterine gestation.  Posterior placenta. Other:  None. Musculoskeletal: No abnormal marrow signal  IMPRESSION: 1. Dilated blind-ending tubular structure in the right lower quadrant measuring up to 13 mm  suspicious for dilated appendix/acute appendicitis. Although not much inflammation in the right lower quadrant there is a small amount of free fluid. Hypointense foci in the tip and proximal lumen may represent appendicolith. Critical Value/emergent results were called by telephone at the time of interpretation on 09/28/2016 at 3:40 am to Dr. Marcille Buffy , who verbally acknowledged these results. Electronically Signed   By: Donavan Foil M.D.   On: 09/28/2016 03:40   US Abdomen Limited Ruq  Result Date: 09/27/2016 CLINICAL DATA:  Acute right upper quadrant pain at [redacted] weeks gestation. EXAM: ULTRASOUND ABDOMEN LIMITED RIGHT UPPER QUADRANT COMPARISON:  None. FINDINGS: Gallbladder: No gallstones or gallbladder wall thickening. No pericholecystic fluid. The sonographer reports no sonographic Murphy's sign. Common bile duct: Diameter: 4 mm, within normal limits. Liver: No focal lesion identified. Within normal limits in parenchymal echogenicity. IMPRESSION: Normal right upper quadrant ultrasound. Electronically Signed   By: Misty Stanley M.D.   On: 09/27/2016 20:06    Review of Systems  Constitutional: Negative for chills and fever.  Gastrointestinal: Positive for abdominal pain, diarrhea, nausea and vomiting. Negative for blood in stool.  Genitourinary: Negative for dysuria.   Blood pressure 120/76, pulse 99, temperature 97.7 F (36.5 C), temperature source Oral, resp. rate 18, height '5\' 6"'  (1.676 m), weight 107.5 kg (237 lb), last menstrual period 02/13/2016, SpO2 99 %. Physical Exam  GENERAL APPEARANCE:  Obese female in NAD.  Pleasant and cooperative.  EARS, NOSE, MOUTH THROAT:  [place]/AT external ears:  no lesions or deformities external nose:  no lesions or deformities hearing:  grossly normal lips:  moist, no deformities EYES external: conjunctiva, lids, sclerae normal pupils:  equal,  round glasses: no  NECK:  Supple, no obvious mass or thyroid mass/enlargement, no trachea deviation  CV ascultation:  RRR, no murmur extremity edema:  no extremity varicosities:  no  RESP auscultation:  breath sounds equal and clear respiratory effort:  normal  BREASTS:  Symmetrical in size.  No dominant masses, nipple discharge or suspicious skin lesions.  GASTROINTESTINAL abdomen:  Firm, distended, palpable uterus above umbilicus, RLQ tenderness and guarding hernia:  none present scar:  none present  MUSCULOSKELETAL  LYMPHATIC: No palpable cervical, supraclavicular adenopathy.  SKIN rash or lesion: none Jaundice:  none  NEUROLOGIC  speech:  normal  PSYCHIATRIC alertness and orientation:  normal mood/affect/behavior:  normal judgement and insight:  normal    Assessment/Plan: Acute appendicitis in the third trimester of pregnancy.  We will transfer her to [facility] long hospital for the surgery and then back to [facility] hospital for postoperative care.  Will arrange for appropriate staff to be present to help monitor the fetus.   I have discussed the procedure and risks of appendectomy. The risks include but are not limited to bleeding, infection, wound problems, anesthesia, injury to intra-abdominal organs, preterm labor, fetal loss, possibility of postoperative ileus. She seems to understand and agrees with the plan.  Kirsten Spearing J 09/28/2016, 9:08 AM

## 2016-09-28 NOTE — Anesthesia Procedure Notes (Addendum)
Procedure Name: Intubation Date/Time: 09/28/2016 12:07 PM Performed by: Marcene DuosFITZGERALD, Diante Barley Pre-anesthesia Checklist: Patient identified, Emergency Drugs available, Suction available, Patient being monitored and Timeout performed Patient Re-evaluated:Patient Re-evaluated prior to inductionOxygen Delivery Method: Circle system utilized Preoxygenation: Pre-oxygenation with 100% oxygen Intubation Type: IV induction, Cricoid Pressure applied and Rapid sequence Laryngoscope Size: Glidescope and 3 Grade View: Grade I Tube type: Oral Tube size: 7.5 mm Number of attempts: 1 Airway Equipment and Method: Patient positioned with wedge pillow,  Rigid stylet and Video-laryngoscopy Placement Confirmation: ETT inserted through vocal cords under direct vision,  positive ETCO2 and breath sounds checked- equal and bilateral Secured at: 22 cm Tube secured with: Tape Dental Injury: Teeth and Oropharynx as per pre-operative assessment

## 2016-09-28 NOTE — Op Note (Signed)
Appendectomy, Lap, Procedure Note  Pre-operative Diagnosis: Acute appendicitis  Post-operative Diagnosis: Same  Procedure:  Open appendectomy  Surgeon:  Avel Peaceodd Denis Carreon, M.D.  Anesthesia:  General   Indications:  This is a 31 year old female who is 32-1/[redacted] weeks pregnant who has acute appendicitis. She now presents for open appendectomy.   Procedure Details   he was brought to the operating room, placed in the supine position and general anesthesia was induced, along with placement of orogastric tube, SCDs, and a Foley catheter. A timeout was performed. The abdomen was prepped and draped in a sterile fashion. A right lower quadrant transverse incision was made through the skin, subcutaneous tissue, anterior and posterior fascia, peritoneum entered the peritoneal cavity. Cloudy fluid was encountered and evacuated.  An enlarged, inflamed appendix was noted extending to the pelvis and this is brought out the wound. I was able to bring the cecum on the wound. The mesoappendix was divided with LigaSure. The appendix was amputated off the cecum, with a cuff of cecum using the linear cutting stapler. The staple line had some bleeding this was oversewn. The staple line was then inverted using interrupted 3-0 silk Lembert type sutures.  I then cultured and irrigated out the abdominal cavity. There is no evidence of bleeding.  The posterior fascias and closed with running #1 PDS suture. The anterior fascia was closed with running #1 PDS suture. The subcutaneous tissues irrigated. The skin was closed with staples and Steri-Strips. A sterile dressing was applied.   She tolerated the procedure without apparent complications. She's taken recovery in satisfactory condition. The rapid response OB RN will be is here waiting  to monitor the baby. She will then be [facility] postoperative [facility].   Instrument, sponge, and needle counts were correct at the conclusion of the case.    Findings: The appendix was found to be inflamed. There were not signs of necrosis.  There was not perforation. There was not abscess formation.  Estimated Blood Loss:  100 ml         Drains: none              Specimens: appendix         Complications:  None; patient tolerated the procedure well.         Disposition: PACU - hemodynamically stable.         Condition: stable

## 2016-09-29 ENCOUNTER — Encounter (HOSPITAL_COMMUNITY): Payer: Self-pay | Admitting: General Surgery

## 2016-09-29 MED ORDER — FAMOTIDINE 20 MG PO TABS
20.0000 mg | ORAL_TABLET | Freq: Two times a day (BID) | ORAL | Status: DC
  Administered 2016-09-29 – 2016-09-30 (×2): 20 mg via ORAL
  Filled 2016-09-29 (×2): qty 1

## 2016-09-29 NOTE — Progress Notes (Signed)
Patient ID: Allison Moreno, female   DOB: 03-30-1986, 31 y.o.   MRN: [id number] [facility] Progress Note:   1 Day Post-Op  Subjective: Mental status is alert and feeling well Objective: Vital signs in last 24 hours: Temp:  [97.5 F (36.4 C)-98.7 F (37.1 C)] 97.9 F (36.6 C) (06/30 0803) Pulse Rate:  [76-91] 86 (06/30 0803) Resp:  [14-22] 18 (06/30 0803) BP: (99-137)/(53-99) 134/80 (06/30 0803) SpO2:  [95 %-100 %] 100 % (06/30 0803)  Intake/Output from previous day: 06/29 0701 - 06/30 0700 In: 2420 [P.O.:120; I.V.:2300] Out: 2350 [Urine:2300; Blood:50] Intake/Output this shift: Total I/O In: 300 [I.V.:300] Out: -   Physical Exam: Work of breathing is normal.  Incision bland and covered with steristrips.  Taking clears OK.   Lab Results:  Results for orders placed or performed during the hospital encounter of 09/27/16 (from the past 48 hour(s))  Protein / creatinine ratio, urine     Status: None   Collection Time: 09/27/16  6:15 PM  Result Value Ref Range   Creatinine, Urine 185.00 mg/dL   Total Protein, Urine 19 mg/dL    Comment: NO NORMAL RANGE ESTABLISHED FOR THIS TEST   Protein Creatinine Ratio 0.10 0.00 - 0.15 mg/mg[Cre]  Urinalysis, Complete w Microscopic     Status: Abnormal   Collection Time: 09/27/16  6:15 PM  Result Value Ref Range   Color, Urine YELLOW YELLOW   APPearance CLEAR CLEAR   Specific Gravity, Urine 1.023 1.005 - 1.030   pH 5.0 5.0 - 8.0   Glucose, UA NEGATIVE NEGATIVE mg/dL   Hgb urine dipstick NEGATIVE NEGATIVE   Bilirubin Urine NEGATIVE NEGATIVE   Ketones, ur 80 (A) NEGATIVE mg/dL   Protein, ur NEGATIVE NEGATIVE mg/dL   Nitrite NEGATIVE NEGATIVE   Leukocytes, UA NEGATIVE NEGATIVE   RBC / HPF 0-5 0 - 5 RBC/hpf   WBC, UA 0-5 0 - 5 WBC/hpf   Bacteria, UA RARE (A) NONE SEEN   Squamous Epithelial / LPF 0-5 (A) NONE SEEN   Mucous PRESENT   CBC     Status: Abnormal   Collection Time: 09/27/16  6:19 PM  Result Value Ref Range   WBC  13.8 (H) 4.0 - 10.5 K/uL   RBC 4.22 3.87 - 5.11 MIL/uL   Hemoglobin 12.2 12.0 - 15.0 g/dL   HCT 36.4 36.0 - 46.0 %   MCV 86.3 78.0 - 100.0 fL   MCH 28.9 26.0 - 34.0 pg   MCHC 33.5 30.0 - 36.0 g/dL   RDW 12.5 11.5 - 15.5 %   Platelets 181 150 - 400 K/uL  Comprehensive metabolic panel     Status: Abnormal   Collection Time: 09/27/16  6:19 PM  Result Value Ref Range   Sodium 135 135 - 145 mmol/L   Potassium 3.7 3.5 - 5.1 mmol/L   Chloride 106 101 - 111 mmol/L   CO2 21 (L) 22 - 32 mmol/L   Glucose, Bld 87 65 - 99 mg/dL   BUN 11 6 - 20 mg/dL   Creatinine, Ser 0.78 0.44 - 1.00 mg/dL   Calcium 8.9 8.9 - 10.3 mg/dL   Total Protein 6.7 6.5 - 8.1 g/dL   Albumin 3.1 (L) 3.5 - 5.0 g/dL   AST 23 15 - 41 U/L   ALT 26 14 - 54 U/L   Alkaline Phosphatase 73 38 - 126 U/L   Total Bilirubin 0.3 0.3 - 1.2 mg/dL   GFR calc non Af Amer >60 >60 mL/min  GFR calc Af Amer >60 >60 mL/min    Comment: (NOTE) The eGFR has been calculated using the CKD EPI equation. This calculation has not been validated in all clinical situations. eGFR's persistently <60 mL/min signify possible Chronic Kidney Disease.    Anion gap 8 5 - 15  Amylase     Status: None   Collection Time: 09/27/16  6:19 PM  Result Value Ref Range   Amylase 81 28 - 100 U/L  Lipase, blood     Status: None   Collection Time: 09/27/16  6:19 PM  Result Value Ref Range   Lipase 22 11 - 51 U/L  CBC with Differential/Platelet     Status: Abnormal   Collection Time: 09/27/16  9:12 PM  Result Value Ref Range   WBC 15.7 (H) 4.0 - 10.5 K/uL   RBC 4.19 3.87 - 5.11 MIL/uL   Hemoglobin 12.3 12.0 - 15.0 g/dL   HCT 36.0 36.0 - 46.0 %   MCV 85.9 78.0 - 100.0 fL   MCH 29.4 26.0 - 34.0 pg   MCHC 34.2 30.0 - 36.0 g/dL   RDW 12.6 11.5 - 15.5 %   Platelets 172 150 - 400 K/uL   Neutrophils Relative % 85 %   Neutro Abs 13.4 (H) 1.7 - 7.7 K/uL   Lymphocytes Relative 8 %   Lymphs Abs 1.2 0.7 - 4.0 K/uL   Monocytes Relative 7 %   Monocytes Absolute  1.1 (H) 0.1 - 1.0 K/uL   Eosinophils Relative 0 %   Eosinophils Absolute 0.0 0.0 - 0.7 K/uL   Basophils Relative 0 %   Basophils Absolute 0.0 0.0 - 0.1 K/uL  Type and screen [place]     Status: None   Collection Time: 09/28/16  5:37 AM  Result Value Ref Range   ABO/RH(D) O POS    Antibody Screen NEG    Sample Expiration 10/01/2016   Type and screen [place]     Status: None   Collection Time: 09/28/16 11:47 AM  Result Value Ref Range   ABO/RH(D) O POS    Antibody Screen NEG    Sample Expiration 10/01/2016   ABO/Rh     Status: None   Collection Time: 09/28/16 11:47 AM  Result Value Ref Range   ABO/RH(D) O POS     Radiology/Results: Mr Pelvis Wo Contrast  Result Date: 09/28/2016 CLINICAL DATA:  Right-sided abdominal pain, patient is 30+ weeks pregnant EXAM: MRI ABDOMEN AND PELVIS WITHOUT CONTRAST TECHNIQUE: Multiplanar multisequence MR imaging of the abdomen and pelvis was performed. No intravenous contrast was administered. COMPARISON:  Ultrasound 09/06/2016 FINDINGS: COMBINED FINDINGS FOR BOTH MR ABDOMEN AND PELVIS Of note the examination was not tailored to evaluate the fetal anatomy or provided detailed assessment of the placenta. Lower chest: No acute findings. Hepatobiliary: No mass or other parenchymal abnormality identified. No stones in the gallbladder. No gallbladder wall thickening Pancreas: No mass, inflammatory changes, or other parenchymal abnormality identified. Spleen:  Within normal limits in size and appearance. Adrenals/Urinary Tract: No masses identified. No evidence of hydronephrosis. Stomach/Bowel: Visualized stomach within normal limits. No dilated small bowel. Blind-ending tubular structure in the right lower quadrant, series 12, image number 79 measuring up to 13 mm suspicious for dilated appendix. Hypointense foci in the tip and proximal lumen may represent appendicolith. Not much surrounding edema but there is a small  amount of free fluid in the right lower quadrant. Vascular/Lymphatic: Non aneurysmal aorta. No significantly enlarged lymph nodes Reproductive:  Single intrauterine gestation.  Posterior placenta. Other:  None. Musculoskeletal: No abnormal marrow signal IMPRESSION: 1. Dilated blind-ending tubular structure in the right lower quadrant measuring up to 13 mm suspicious for dilated appendix/acute appendicitis. Although not much inflammation in the right lower quadrant there is a small amount of free fluid. Hypointense foci in the tip and proximal lumen may represent appendicolith. Critical Value/emergent results were called by telephone at the time of interpretation on 09/28/2016 at 3:40 am to Dr. Marcille Buffy , who verbally acknowledged these results. Electronically Signed   By: Donavan Foil M.D.   On: 09/28/2016 03:40   Mr Abdomen Wo Contrast  Result Date: 09/28/2016 CLINICAL DATA:  Right-sided abdominal pain, patient is 30+ weeks pregnant EXAM: MRI ABDOMEN AND PELVIS WITHOUT CONTRAST TECHNIQUE: Multiplanar multisequence MR imaging of the abdomen and pelvis was performed. No intravenous contrast was administered. COMPARISON:  Ultrasound 09/06/2016 FINDINGS: COMBINED FINDINGS FOR BOTH MR ABDOMEN AND PELVIS Of note the examination was not tailored to evaluate the fetal anatomy or provided detailed assessment of the placenta. Lower chest: No acute findings. Hepatobiliary: No mass or other parenchymal abnormality identified. No stones in the gallbladder. No gallbladder wall thickening Pancreas: No mass, inflammatory changes, or other parenchymal abnormality identified. Spleen:  Within normal limits in size and appearance. Adrenals/Urinary Tract: No masses identified. No evidence of hydronephrosis. Stomach/Bowel: Visualized stomach within normal limits. No dilated small bowel. Blind-ending tubular structure in the right lower quadrant, series 12, image number 79 measuring up to 13 mm suspicious for dilated appendix.  Hypointense foci in the tip and proximal lumen may represent appendicolith. Not much surrounding edema but there is a small amount of free fluid in the right lower quadrant. Vascular/Lymphatic: Non aneurysmal aorta. No significantly enlarged lymph nodes Reproductive: Single intrauterine gestation.  Posterior placenta. Other:  None. Musculoskeletal: No abnormal marrow signal IMPRESSION: 1. Dilated blind-ending tubular structure in the right lower quadrant measuring up to 13 mm suspicious for dilated appendix/acute appendicitis. Although not much inflammation in the right lower quadrant there is a small amount of free fluid. Hypointense foci in the tip and proximal lumen may represent appendicolith. Critical Value/emergent results were called by telephone at the time of interpretation on 09/28/2016 at 3:40 am to Dr. Marcille Buffy , who verbally acknowledged these results. Electronically Signed   By: Donavan Foil M.D.   On: 09/28/2016 03:40   US Abdomen Limited Ruq  Result Date: 09/27/2016 CLINICAL DATA:  Acute right upper quadrant pain at [redacted] weeks gestation. EXAM: ULTRASOUND ABDOMEN LIMITED RIGHT UPPER QUADRANT COMPARISON:  None. FINDINGS: Gallbladder: No gallstones or gallbladder wall thickening. No pericholecystic fluid. The sonographer reports no sonographic Murphy's sign. Common bile duct: Diameter: 4 mm, within normal limits. Liver: No focal lesion identified. Within normal limits in parenchymal echogenicity. IMPRESSION: Normal right upper quadrant ultrasound. Electronically Signed   By: Misty Stanley M.D.   On: 09/27/2016 20:06    Anti-infectives: Anti-infectives    Start     Dose/Rate Route Frequency Ordered Stop   09/28/16 2030  piperacillin-tazobactam (ZOSYN) IVPB 3.375 g     3.375 g 12.5 mL/hr over 240 Minutes Intravenous Every 8 hours 09/28/16 1510 09/29/16 0828   09/28/16 0430  piperacillin-tazobactam (ZOSYN) IVPB 3.375 g  Status:  Discontinued     3.375 g 12.5 mL/hr over 240 Minutes  Intravenous Every 8 hours 09/28/16 0421 09/28/16 1510      Assessment/Plan: Problem List: Patient Active Problem List   Diagnosis Date Noted  . Appendicitis 09/28/2016  .  Rubella non-immune status, antepartum 05/04/2016  . Supervision of normal pregnancy, antepartum 05/03/2016  . Adopted 05/03/2016  . Adjustment disorder with anxious mood 12/21/2014  . 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  OK to discharge tomorrow and Dr. Zella Richer can see back in the office in 2 weeks.   1 Day Post-Op    LOS: 1 day   Matt B. Hassell Done, MD, [facility] [phone] beeper [phone]  09/29/2016 12:08 PM

## 2016-09-29 NOTE — Progress Notes (Signed)
1 Day Post-Op Procedure(s) (LRB): APPENDECTOMY (N/A)  Subjective: Patient reports incisional pain and no problems voiding.  No flatus yet.  Objective: I have reviewed patient's vital signs, intake and output, medications and labs. FHR: Category 1 General: alert and no distress Resp: clear to auscultation bilaterally Cardio: regular rate and rhythm GI: soft, mildly tender near incisionr;+ bowel sounds; no masses, no organomegaly and incision: clean, dry, intact and old drainage present Extremities: extremities normal, atraumatic, no cyanosis or edema and Homans sign is negative, no sign of DVT  Assessment: s/p Procedure(s): APPENDECTOMY (N/A): stable and progressing well  Plan: Will await General Surgery recommendations Encourage OOB Reassuring FHR, no obstetric concerns currently Continue inpatient care.   LOS: 1 day    Jaynie CollinsUgonna Deetta Siegmann, MD 09/29/2016, 7:49 AM

## 2016-09-30 DIAGNOSIS — Z3A32 32 weeks gestation of pregnancy: Secondary | ICD-10-CM

## 2016-09-30 MED ORDER — OXYCODONE-ACETAMINOPHEN 5-325 MG PO TABS: 1.0000 | ORAL_TABLET | Freq: Four times a day (QID) | ORAL | 0 refills | Status: DC | PRN

## 2016-09-30 NOTE — H&P (Signed)
Patient Allison Moreno is a 31 y.o. G1P0 here with complaints of abdominal pain that started at 0700 this morning. The pain lasted from 7 to 12PM, and then she started having lower right-sided abdominal pain at 1:30pm that got worse over the course of the afternoon. She tried laying down and using a heating pad; she called EMS and was brought in for evaluation.  History   CSN: [id number]  Arrival date and time: 09/27/16 1752   None        Chief Complaint  Patient presents with  . Abdominal Pain   Abdominal Pain  This is a new problem. The current episode started today. The onset quality is sudden. The problem occurs constantly. The problem has been gradually worsening. The pain is located in the epigastric region and RLQ. The pain is at a severity of 6/10. The quality of the pain is cramping. Pain radiation: radiates to her right groin. Associated symptoms include diarrhea and vomiting. Pertinent negatives include no dysuria. Exacerbated by: moving her right leg. The pain is relieved by nothing. Treatments tried: heat and rest. The treatment provided no relief.            OB History    Gravida Para Term Preterm AB Living   1             SAB TAB Ectopic Multiple Live Births                      Past Medical History:  Diagnosis Date  . ADD (attention deficit disorder)   . Chronic ear infection   . Depression   . 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 TONSILLECTOMY    . TYMPANOPLASTY Right   . WISDOM TOOTH EXTRACTION           Family History  Problem Relation Age of Onset  . Depression Mother   . Diabetes Father         Social History  Substance Use Topics  . Smoking status: Never Smoker  . Smokeless tobacco: Never Used  . Alcohol use 0.0 oz/week    Allergies:       Allergies  Allergen Reactions   . Sulfa Antibiotics Other (See Comments)    Reaction:  Stevens-Johnson Syndrome            Prescriptions Prior to Admission  Medication Sig Dispense Refill Last Dose  . acetaminophen (TYLENOL) 500 MG chewable tablet Chew 1,500 mg by mouth every 6 (six) hours as needed for pain.   09/27/2016 at Unknown time  . Prenatal MV-Min-FA-Omega-3 (PRENATAL GUMMIES/DHA & FA) 0.4-32.5 MG CHEW Chew 2 each by mouth at bedtime.   09/26/2016 at Unknown time  . ranitidine (ZANTAC) 150 MG tablet Take 150-300 mg by mouth 2 (two) times daily as needed for heartburn.   Past Week at Unknown time    Review of Systems  Respiratory: Negative.   Cardiovascular: Negative.   Gastrointestinal: Positive for abdominal pain, diarrhea and vomiting.       Green and watery; she had stomach cramps with it at 6:40 am.   She has been nauseated all day; she threw up in MAU and then said she felt better. She feels like after she threw up she felt better.   Genitourinary: Negative for dysuria, vaginal bleeding and vaginal  discharge.  Musculoskeletal: Negative.   Neurological: Negative.    Physical Exam   Blood pressure 139/90, pulse 97, temperature 97.7 F (36.5 C), temperature source Oral, resp. rate (!) 22, height 5\' 6"  (1.676 m), weight 237 lb (107.5 kg), last menstrual period 02/13/2016, SpO2 98 %.  Physical Exam  Constitutional: She is oriented to person, place, and time.  HENT:  Head: Atraumatic.  Respiratory: Effort normal.  GI: Soft. There is tenderness.  Right lower quadrant tenderness, no abdominal rigidity, rebound tendenress or guarding  Musculoskeletal: Normal range of motion.  Neurological: She is alert and oriented to person, place, and time. She has normal reflexes.  Skin: Skin is warm and dry.  Psychiatric: She has a normal mood and affect.   Lab Results Last 24 Hours       Results for orders placed or performed during the hospital encounter of 09/27/16 (from the past 24 hour(s))   Protein / creatinine ratio, urine     Status: None   Collection Time: 09/27/16  6:15 PM  Result Value Ref Range   Creatinine, Urine 185.00 mg/dL   Total Protein, Urine 19 mg/dL   Protein Creatinine Ratio 0.10 0.00 - 0.15 mg/mg[Cre]  Urinalysis, Complete w Microscopic     Status: Abnormal   Collection Time: 09/27/16  6:15 PM  Result Value Ref Range   Color, Urine YELLOW YELLOW   APPearance CLEAR CLEAR   Specific Gravity, Urine 1.023 1.005 - 1.030   pH 5.0 5.0 - 8.0   Glucose, UA NEGATIVE NEGATIVE mg/dL   Hgb urine dipstick NEGATIVE NEGATIVE   Bilirubin Urine NEGATIVE NEGATIVE   Ketones, ur 80 (A) NEGATIVE mg/dL   Protein, ur NEGATIVE NEGATIVE mg/dL   Nitrite NEGATIVE NEGATIVE   Leukocytes, UA NEGATIVE NEGATIVE   RBC / HPF 0-5 0 - 5 RBC/hpf   WBC, UA 0-5 0 - 5 WBC/hpf   Bacteria, UA RARE (A) NONE SEEN   Squamous Epithelial / LPF 0-5 (A) NONE SEEN   Mucous PRESENT   CBC     Status: Abnormal   Collection Time: 09/27/16  6:19 PM  Result Value Ref Range   WBC 13.8 (H) 4.0 - 10.5 K/uL   RBC 4.22 3.87 - 5.11 MIL/uL   Hemoglobin 12.2 12.0 - 15.0 g/dL   HCT 16.1 09.6 - 04.5 %   MCV 86.3 78.0 - 100.0 fL   MCH 28.9 26.0 - 34.0 pg   MCHC 33.5 30.0 - 36.0 g/dL   RDW 40.9 81.1 - 91.4 %   Platelets 181 150 - 400 K/uL  Comprehensive metabolic panel     Status: Abnormal   Collection Time: 09/27/16  6:19 PM  Result Value Ref Range   Sodium 135 135 - 145 mmol/L   Potassium 3.7 3.5 - 5.1 mmol/L   Chloride 106 101 - 111 mmol/L   CO2 21 (L) 22 - 32 mmol/L   Glucose, Bld 87 65 - 99 mg/dL   BUN 11 6 - 20 mg/dL   Creatinine, Ser 7.82 0.44 - 1.00 mg/dL   Calcium 8.9 8.9 - 95.6 mg/dL   Total Protein 6.7 6.5 - 8.1 g/dL   Albumin 3.1 (L) 3.5 - 5.0 g/dL   AST 23 15 - 41 U/L   ALT 26 14 - 54 U/L   Alkaline Phosphatase 73 38 - 126 U/L   Total Bilirubin 0.3 0.3 - 1.2 mg/dL   GFR calc non Af Amer >60 >60 mL/min   GFR calc Af Amer >60 >60 mL/min  Anion gap 8 5 - 15  Amylase     Status: None   Collection Time: 09/27/16  6:19 PM  Result Value Ref Range   Amylase 81 28 - 100 U/L  Lipase, blood     Status: None   Collection Time: 09/27/16  6:19 PM  Result Value Ref Range   Lipase 22 11 - 51 U/L  CBC with Differential/Platelet     Status: Abnormal   Collection Time: 09/27/16  9:12 PM  Result Value Ref Range   WBC 15.7 (H) 4.0 - 10.5 K/uL   RBC 4.19 3.87 - 5.11 MIL/uL   Hemoglobin 12.3 12.0 - 15.0 g/dL   HCT 40.936.0 81.136.0 - 91.446.0 %   MCV 85.9 78.0 - 100.0 fL   MCH 29.4 26.0 - 34.0 pg   MCHC 34.2 30.0 - 36.0 g/dL   RDW 78.212.6 95.611.5 - 21.315.5 %   Platelets 172 150 - 400 K/uL   Neutrophils Relative % 85 %   Neutro Abs 13.4 (H) 1.7 - 7.7 K/uL   Lymphocytes Relative 8 %   Lymphs Abs 1.2 0.7 - 4.0 K/uL   Monocytes Relative 7 %   Monocytes Absolute 1.1 (H) 0.1 - 1.0 K/uL   Eosinophils Relative 0 %   Eosinophils Absolute 0.0 0.0 - 0.7 K/uL   Basophils Relative 0 %   Basophils Absolute 0.0 0.0 - 0.1 K/uL     Mr Pelvis Wo Contrast  Result Date: 09/28/2016 CLINICAL DATA:  Right-sided abdominal pain, patient is 30+ weeks pregnant EXAM: MRI ABDOMEN AND PELVIS WITHOUT CONTRAST TECHNIQUE: Multiplanar multisequence MR imaging of the abdomen and pelvis was performed. No intravenous contrast was administered. COMPARISON:  Ultrasound 09/06/2016 FINDINGS: COMBINED FINDINGS FOR BOTH MR ABDOMEN AND PELVIS Of note the examination was not tailored to evaluate the fetal anatomy or provided detailed assessment of the placenta. Lower chest: No acute findings. Hepatobiliary: No mass or other parenchymal abnormality identified. No stones in the gallbladder. No gallbladder wall thickening Pancreas: No mass, inflammatory changes, or other parenchymal abnormality identified. Spleen:  Within normal limits in size and appearance. Adrenals/Urinary Tract: No masses identified. No evidence of hydronephrosis. Stomach/Bowel: Visualized stomach  within normal limits. No dilated small bowel. Blind-ending tubular structure in the right lower quadrant, series 12, image number 79 measuring up to 13 mm suspicious for dilated appendix. Hypointense foci in the tip and proximal lumen may represent appendicolith. Not much surrounding edema but there is a small amount of free fluid in the right lower quadrant. Vascular/Lymphatic: Non aneurysmal aorta. No significantly enlarged lymph nodes Reproductive: Single intrauterine gestation.  Posterior placenta. Other:  None. Musculoskeletal: No abnormal marrow signal IMPRESSION: 1. Dilated blind-ending tubular structure in the right lower quadrant measuring up to 13 mm suspicious for dilated appendix/acute appendicitis. Although not much inflammation in the right lower quadrant there is a small amount of free fluid. Hypointense foci in the tip and proximal lumen may represent appendicolith. Critical Value/emergent results were called by telephone at the time of interpretation on 09/28/2016 at 3:40 am to Dr. Thressa ShellerHEATHER HOGAN , who verbally acknowledged these results. Electronically Signed   By: Jasmine PangKim  Fujinaga M.D.   On: 09/28/2016 03:40   Mr Abdomen Wo Contrast  Result Date: 09/28/2016 CLINICAL DATA:  Right-sided abdominal pain, patient is 30+ weeks pregnant EXAM: MRI ABDOMEN AND PELVIS WITHOUT CONTRAST TECHNIQUE: Multiplanar multisequence MR imaging of the abdomen and pelvis was performed. No intravenous contrast was administered. COMPARISON:  Ultrasound 09/06/2016 FINDINGS: COMBINED FINDINGS FOR BOTH MR ABDOMEN  AND PELVIS Of note the examination was not tailored to evaluate the fetal anatomy or provided detailed assessment of the placenta. Lower chest: No acute findings. Hepatobiliary: No mass or other parenchymal abnormality identified. No stones in the gallbladder. No gallbladder wall thickening Pancreas: No mass, inflammatory changes, or other parenchymal abnormality identified. Spleen:  Within normal limits in size and  appearance. Adrenals/Urinary Tract: No masses identified. No evidence of hydronephrosis. Stomach/Bowel: Visualized stomach within normal limits. No dilated small bowel. Blind-ending tubular structure in the right lower quadrant, series 12, image number 79 measuring up to 13 mm suspicious for dilated appendix. Hypointense foci in the tip and proximal lumen may represent appendicolith. Not much surrounding edema but there is a small amount of free fluid in the right lower quadrant. Vascular/Lymphatic: Non aneurysmal aorta. No significantly enlarged lymph nodes Reproductive: Single intrauterine gestation.  Posterior placenta. Other:  None. Musculoskeletal: No abnormal marrow signal IMPRESSION: 1. Dilated blind-ending tubular structure in the right lower quadrant measuring up to 13 mm suspicious for dilated appendix/acute appendicitis. Although not much inflammation in the right lower quadrant there is a small amount of free fluid. Hypointense foci in the tip and proximal lumen may represent appendicolith. Critical Value/emergent results were called by telephone at the time of interpretation on 09/28/2016 at 3:40 am to Dr. Thressa Sheller , who verbally acknowledged these results. Electronically Signed   By: Jasmine Pang M.D.   On: 09/28/2016 03:40   US Abdomen Limited Ruq  Result Date: 09/27/2016 CLINICAL DATA:  Acute right upper quadrant pain at [redacted] weeks gestation. EXAM: ULTRASOUND ABDOMEN LIMITED RIGHT UPPER QUADRANT COMPARISON:  None. FINDINGS: Gallbladder: No gallstones or gallbladder wall thickening. No pericholecystic fluid. The sonographer reports no sonographic Murphy's sign. Common bile duct: Diameter: 4 mm, within normal limits. Liver: No focal lesion identified. Within normal limits in parenchymal echogenicity. IMPRESSION: Normal right upper quadrant ultrasound. Electronically Signed   By: Kennith Center M.D.   On: 09/27/2016 20:06    MAU Course  Procedures  MDM  CBC: elevated white count  (from 6 to 13 to 15);  Blood pressures labile in MAU, from normal to 140/90 CMP normal; UPC 0.10 UA : 80 of ketones Percocet for pain; pain now down from 9/10 to 4/10  CT with contrast Patient care endorsed to [facility] at 2155 Radiology recommending MRI without contrast v CT abd/pelvis.   2252: D/W Dr. Dalene Seltzer at [facility] ED, patient is not a transfer. She will be coming over for MRI, and then returning back to [facility] for results.   4098: D/W Dr. Erin Fulling, will call surgery on call.   [phone] D/W Dr. Gerrit Friends with general surgery. He would like to have the patient admitted, kept NPO and started on Zosyn. He or his partner will come by in the morning to see the patient. Concern for preterm labor and delivery after surgery Assessment and Plan   1. [redacted] weeks gestation of pregnancy   2. Abdominal pain during pregnancy in third trimester   3. Abdominal pain   4. Acute appendicitis, unspecified acute appendicitis type    Admit to antenatal  Zosyn per pharmacy Pain management as needed BMZ x2 for fetal lung maturity due to potential risk of preterm labor and delivery if surgery is done.    Gen surgery to see pt  Wylie Russon L. Harraway-Smith, M.D., [place] [place]

## 2016-09-30 NOTE — Discharge Summary (Addendum)
Antenatal Physician Discharge Summary  Patient ID: Allison Moreno MRN: [id number] DOB/AGE: Jan 13, 1986 31 y.o.  Admit date: 09/27/2016 Discharge date: 09/30/2016  Admission Diagnoses: Principal Problem:   Appendicitis Active Problems:   [redacted] weeks gestation of pregnancy   Discharge Diagnoses: Same  Prenatal Procedures: NST and MRI  Consults: General Surgery  Hospital Course:  This is a 31 y.o. G1P0 with IUP at [redacted]w[redacted]d admitted for acute appendicitis. She was admitted with abdominal pain, noted to have an acute appendicits by MRI. She was started on AIV Zosyn and given BMZ and then transferred to [facility] for surgery with general surgery who did an open appendectomy. Post surgically she transferred back to [facility]. She did not continue on Abx. She was observed, fetal heart rate monitoring remained reassuring, and she had no signs/symptoms of progressing preterm labor. Her diet was advanced and she had no nausea and she passed flatus and she was deemed stable for discharge to home with outpatient follow up.  Discharge Exam: Temp:  [97.8 F (36.6 C)-98.8 F (37.1 C)] 98.8 F (37.1 C) (07/01 0800) Pulse Rate:  [68-104] 68 (07/01 0800) Resp:  [17-18] 18 (07/01 0800) BP: (93-139)/(53-96) 139/96 (07/01 0800) SpO2:  [95 %-99 %] 99 % (07/01 0800) Physical Examination: CONSTITUTIONAL: Well-developed, well-nourished female in no acute distress.  NECK: Normal range of motion, supple, no masses SKIN: Skin is warm and dry. No rash noted. Not diaphoretic. No erythema. No pallor. Incision in RLQ has steri-strips and staples and is without erythema or drainage. NEUROLGIC: Alert and oriented to person, place, and time. CARDIOVASCULAR: Normal heart rate noted, regular rhythm RESPIRATORY: Effort and breath sounds normal MUSCULOSKELETAL: Normal range of motion. No edema and no tenderness. 2+ distal pulses. ABDOMEN: Soft, nontender, nondistended, gravid.  Fetal monitoring: FHR: 125 bpm,  Variability: moderate, Accelerations: Present, Decelerations: Absent  Uterine activity: 0 contractions per hour  Significant Diagnostic Studies:  Results for orders placed or performed during the hospital encounter of 09/27/16 (from the past 168 hour(s))  Protein / creatinine ratio, urine   Collection Time: 09/27/16  6:15 PM  Result Value Ref Range   Creatinine, Urine 185.00 mg/dL   Total Protein, Urine 19 mg/dL   Protein Creatinine Ratio 0.10 0.00 - 0.15 mg/mg[Cre]  Urinalysis, Complete w Microscopic   Collection Time: 09/27/16  6:15 PM  Result Value Ref Range   Color, Urine YELLOW YELLOW   APPearance CLEAR CLEAR   Specific Gravity, Urine 1.023 1.005 - 1.030   pH 5.0 5.0 - 8.0   Glucose, UA NEGATIVE NEGATIVE mg/dL   Hgb urine dipstick NEGATIVE NEGATIVE   Bilirubin Urine NEGATIVE NEGATIVE   Ketones, ur 80 (A) NEGATIVE mg/dL   Protein, ur NEGATIVE NEGATIVE mg/dL   Nitrite NEGATIVE NEGATIVE   Leukocytes, UA NEGATIVE NEGATIVE   RBC / HPF 0-5 0 - 5 RBC/hpf   WBC, UA 0-5 0 - 5 WBC/hpf   Bacteria, UA RARE (A) NONE SEEN   Squamous Epithelial / LPF 0-5 (A) NONE SEEN   Mucous PRESENT   CBC   Collection Time: 09/27/16  6:19 PM  Result Value Ref Range   WBC 13.8 (H) 4.0 - 10.5 K/uL   RBC 4.22 3.87 - 5.11 MIL/uL   Hemoglobin 12.2 12.0 - 15.0 g/dL   HCT 82.9 56.2 - 13.0 %   MCV 86.3 78.0 - 100.0 fL   MCH 28.9 26.0 - 34.0 pg   MCHC 33.5 30.0 - 36.0 g/dL   RDW 86.5 78.4 - 69.6 %  Platelets 181 150 - 400 K/uL  Comprehensive metabolic panel   Collection Time: 09/27/16  6:19 PM  Result Value Ref Range   Sodium 135 135 - 145 mmol/L   Potassium 3.7 3.5 - 5.1 mmol/L   Chloride 106 101 - 111 mmol/L   CO2 21 (L) 22 - 32 mmol/L   Glucose, Bld 87 65 - 99 mg/dL   BUN 11 6 - 20 mg/dL   Creatinine, Ser 9.81 0.44 - 1.00 mg/dL   Calcium 8.9 8.9 - 19.1 mg/dL   Total Protein 6.7 6.5 - 8.1 g/dL   Albumin 3.1 (L) 3.5 - 5.0 g/dL   AST 23 15 - 41 U/L   ALT 26 14 - 54 U/L   Alkaline Phosphatase  73 38 - 126 U/L   Total Bilirubin 0.3 0.3 - 1.2 mg/dL   GFR calc non Af Amer >60 >60 mL/min   GFR calc Af Amer >60 >60 mL/min   Anion gap 8 5 - 15  Amylase   Collection Time: 09/27/16  6:19 PM  Result Value Ref Range   Amylase 81 28 - 100 U/L  Lipase, blood   Collection Time: 09/27/16  6:19 PM  Result Value Ref Range   Lipase 22 11 - 51 U/L  CBC with Differential/Platelet   Collection Time: 09/27/16  9:12 PM  Result Value Ref Range   WBC 15.7 (H) 4.0 - 10.5 K/uL   RBC 4.19 3.87 - 5.11 MIL/uL   Hemoglobin 12.3 12.0 - 15.0 g/dL   HCT 47.8 29.5 - 62.1 %   MCV 85.9 78.0 - 100.0 fL   MCH 29.4 26.0 - 34.0 pg   MCHC 34.2 30.0 - 36.0 g/dL   RDW 30.8 65.7 - 84.6 %   Platelets 172 150 - 400 K/uL   Neutrophils Relative % 85 %   Neutro Abs 13.4 (H) 1.7 - 7.7 K/uL   Lymphocytes Relative 8 %   Lymphs Abs 1.2 0.7 - 4.0 K/uL   Monocytes Relative 7 %   Monocytes Absolute 1.1 (H) 0.1 - 1.0 K/uL   Eosinophils Relative 0 %   Eosinophils Absolute 0.0 0.0 - 0.7 K/uL   Basophils Relative 0 %   Basophils Absolute 0.0 0.0 - 0.1 K/uL  Type and screen [facility]   Collection Time: 09/28/16  5:37 AM  Result Value Ref Range   ABO/RH(D) O POS    Antibody Screen NEG    Sample Expiration 10/01/2016   Type and screen [facility]   Collection Time: 09/28/16 11:47 AM  Result Value Ref Range   ABO/RH(D) O POS    Antibody Screen NEG    Sample Expiration 10/01/2016   ABO/Rh   Collection Time: 09/28/16 11:47 AM  Result Value Ref Range   ABO/RH(D) O POS   Results for orders placed or performed during the hospital encounter of 09/24/16 (from the past 168 hour(s))  Protein / creatinine ratio, urine   Collection Time: 09/24/16  8:20 PM  Result Value Ref Range   Creatinine, Urine 58.00 mg/dL   Total Protein, Urine <6 mg/dL   Protein Creatinine Ratio        0.00 - 0.15 mg/mg[Cre]  Urinalysis, Routine w reflex microscopic   Collection Time: 09/24/16  8:20 PM   Result Value Ref Range   Color, Urine YELLOW YELLOW   APPearance CLEAR CLEAR   Specific Gravity, Urine <1.005 (L) 1.005 - 1.030   pH 6.5 5.0 - 8.0   Glucose, UA  NEGATIVE NEGATIVE mg/dL   Hgb urine dipstick NEGATIVE NEGATIVE   Bilirubin Urine NEGATIVE NEGATIVE   Ketones, ur NEGATIVE NEGATIVE mg/dL   Protein, ur NEGATIVE NEGATIVE mg/dL   Nitrite NEGATIVE NEGATIVE   Leukocytes, UA NEGATIVE NEGATIVE  CBC   Collection Time: 09/24/16  8:45 PM  Result Value Ref Range   WBC 6.7 4.0 - 10.5 K/uL   RBC 3.66 (L) 3.87 - 5.11 MIL/uL   Hemoglobin 11.0 (L) 12.0 - 15.0 g/dL   HCT 16.1 (L) 09.6 - 04.5 %   MCV 86.9 78.0 - 100.0 fL   MCH 30.1 26.0 - 34.0 pg   MCHC 34.6 30.0 - 36.0 g/dL   RDW 40.9 81.1 - 91.4 %   Platelets 177 150 - 400 K/uL  Comprehensive metabolic panel   Collection Time: 09/24/16  8:45 PM  Result Value Ref Range   Sodium 137 135 - 145 mmol/L   Potassium 3.6 3.5 - 5.1 mmol/L   Chloride 108 101 - 111 mmol/L   CO2 22 22 - 32 mmol/L   Glucose, Bld 100 (H) 65 - 99 mg/dL   BUN 13 6 - 20 mg/dL   Creatinine, Ser 7.82 0.44 - 1.00 mg/dL   Calcium 8.8 (L) 8.9 - 10.3 mg/dL   Total Protein 6.3 (L) 6.5 - 8.1 g/dL   Albumin 2.9 (L) 3.5 - 5.0 g/dL   AST 23 15 - 41 U/L   ALT 26 14 - 54 U/L   Alkaline Phosphatase 68 38 - 126 U/L   Total Bilirubin 0.3 0.3 - 1.2 mg/dL   GFR calc non Af Amer >60 >60 mL/min   GFR calc Af Amer >60 >60 mL/min   Anion gap 7 5 - 15    Discharge Condition: Stable  Disposition: 01-Home or Self Care   Discharge Instructions    Discharge activity:    Complete by:  As directed    No lifting > 20 lbs x 4 wks   Discharge diet:  No restrictions    Complete by:  As directed    Fetal Kick Count:  Lie on our left side for one hour after a meal, and count the number of times your baby kicks.  If it is less than 5 times, get up, move around and drink some juice.  Repeat the test 30 minutes later.  If it is still less than 5 kicks in an hour, notify your doctor.     Complete by:  As directed    No sexual activity restrictions    Complete by:  As directed    Notify physician for a general feeling that "something is not right"    Complete by:  As directed    Notify physician for increase or change in vaginal discharge    Complete by:  As directed    Notify physician for intestinal cramps, with or without diarrhea, sometimes described as "gas pain"    Complete by:  As directed    Notify physician for leaking of fluid    Complete by:  As directed    Notify physician for low, dull backache, unrelieved by heat or Tylenol    Complete by:  As directed    Notify physician for menstrual like cramps    Complete by:  As directed    Notify physician for pelvic pressure    Complete by:  As directed    Notify physician for uterine contractions.  These may be painless and feel like the uterus is tightening or  the baby is  "balling up"    Complete by:  As directed    Notify physician for vaginal bleeding    Complete by:  As directed    PRETERM LABOR:  Includes any of the follwing symptoms that occur between 20 - [redacted] weeks gestation.  If these symptoms are not stopped, preterm labor can result in preterm delivery, placing your baby at risk    Complete by:  As directed      Allergies as of 09/30/2016      Reactions   Sulfa Antibiotics Other (See Comments)   Reaction:  Stevens-Johnson Syndrome       Medication List    TAKE these medications   acetaminophen 500 MG chewable tablet Commonly known as:  TYLENOL Chew 1,500 mg by mouth every 6 (six) hours as needed for pain.   oxyCODONE-acetaminophen 5-325 MG tablet Commonly known as:  PERCOCET/ROXICET Take 1-2 tablets by mouth every 6 (six) hours as needed.   PRENATAL GUMMIES/DHA & FA 0.4-32.5 MG Chew Chew 2 each by mouth at bedtime.   ranitidine 150 MG tablet Commonly known as:  ZANTAC Take 150-300 mg by mouth 2 (two) times daily as needed for heartburn.      Follow-up Information    [facility].   Specialty:  Obstetrics and Gynecology Contact information: [address] [place] [id number] [phone]       Avel Peaceosenbower, Todd, MD. Schedule an appointment as soon as possible for a visit in 2 week(s).   Specialty:  General Surgery Contact information: [address] [place] [phone]          43 minutes were spent in discharge activity for this patient. Signed: Reva Boresanya S Lavren Lewan M.D. 09/30/2016, 9:28 AM

## 2016-09-30 NOTE — Progress Notes (Signed)
Discharge instructions provided, questions answered, pt states understanding, signs and given copy along with work note and prescription

## 2016-09-30 NOTE — Discharge Instructions (Signed)
Open Appendectomy, Care After Refer to this sheet in the next few weeks. These instructions provide you with information about caring for yourself after your procedure. Your health care provider may also give you more specific instructions. Your treatment has been planned according to current medical practices, but problems sometimes occur. Call your health care provider if you have any problems or questions after your procedure. What can I expect after the procedure? After the procedure, it is common to have:  A decrease in your energy level.  Pain in the area where the surgical cut (incision) was made.  Constipation. This can be caused by pain medicine and a decrease in your activity.  Follow these instructions at home: Medicines  Take over-the-counter and prescription medicines only as told by your health care provider.  Do not drive for 24 hours if you received a sedative.  Do not drive or operate heavy machinery while taking prescription pain medicine.  If you were prescribed an antibiotic medicine, take it as told by your health care provider. Do not stop taking the antibiotic even if you start to feel better. Activity  Gradually return to your regular activities.  For 4 weeks or as long as told by your health care provider: ? Do not lift anything that is heavier than 20 lb  ? Do not play contact sports. Bathing  Keep your incision clean and dry. ? Gently wash the incision with soap and water. ? Rinse the incision with water to remove all soap. ? Pat the incision dry with a clean towel. Do not rub the incision.  You may take showers after 48 hours.  Do not take baths, swim, or use hot tubs for 2 weeks or as long as told by your health care provider. Incision care  Follow instructions from your health care provider about caring for your incision. Make sure you: ? Wash your hands with soap and water before you change your bandage (dressing). If soap and water are not  available, use hand sanitizer. ? Change your dressing as told by your health care provider. ? Leave stitches (sutures), skin glue, or adhesive strips in place. In some cases, these skin closures may need to be in place for 2 weeks or longer. If adhesive strip edges start to loosen and curl up, you may trim the loose edges. Do not remove adhesive strips entirely unless your health care provider tells you to remove them.  Check your incision area every day for signs of infection. Check for: ? More redness, swelling, or pain. ? More fluid or blood. ? Warmth. ? Pus or a bad smell. Other Instructions  If you were sent home with a drain, follow instructions from your health care provider about how to care for the drain and how to empty it.  Take deep breaths. This helps to prevent your lungs from becoming inflamed.  To relieve and prevent constipation: ? Drink plenty of fluids. ? Eat plenty of fruits and vegetables.  Keep all follow-up visits as told by your health care provider. This is important. Contact a health care provider if:  You have more redness, swelling, or pain at the site of your incision.  You have more fluid or blood coming from your incision.  Your incision feels warm to the touch.  You have pus or a bad smell coming from your incision or dressing.  Your incision edges break open after your sutures have been removed.  You have increasing pain in your shoulders.  You  feel dizzy or you faint.  You develop shortness of breath.  You keep feeling nauseous or you are vomiting.  You have diarrhea or you cannot control your bowel functions.  You lose your appetite.  You develop swelling or pain in your legs. Get help right away if:  You have a fever.  You develop a rash.  You have difficulty breathing.  You have sharp pains in your chest. This information is not intended to replace advice given to you by your health care provider. Make sure you discuss any  questions you have with your health care provider. Document Released: 11/01/2003 Document Revised: 08/22/2015 Document Reviewed: 09/06/2014 Elsevier Interactive Patient Education  2018 [employer].

## 2016-10-01 ENCOUNTER — Telehealth: Payer: Self-pay | Admitting: Radiology

## 2016-10-01 NOTE — Telephone Encounter (Signed)
Left voicemail on cell phone for patient to call back [facility] to schedule a hospital f/u from appendicitis in 1-2 weeks, per Dr Tawni LevyPratt's request

## 2016-10-02 ENCOUNTER — Telehealth: Payer: Self-pay | Admitting: Radiology

## 2016-10-02 NOTE — Telephone Encounter (Signed)
Left voicemail on cell phone to call [facility] to schedule hospital f/u in 1-2 Weeks. (2nd attempt, 2 voicemail's  left)

## 2016-10-15 ENCOUNTER — Encounter: Payer: Self-pay | Admitting: Obstetrics and Gynecology

## 2016-10-15 ENCOUNTER — Encounter: Payer: Self-pay | Admitting: *Deleted

## 2016-10-15 ENCOUNTER — Ambulatory Visit (INDEPENDENT_AMBULATORY_CARE_PROVIDER_SITE_OTHER): Payer: Medicaid Other | Admitting: Obstetrics and Gynecology

## 2016-10-15 VITALS — BP 117/85 | HR 101 | Wt 228.0 lb

## 2016-10-15 DIAGNOSIS — O26843 Uterine size-date discrepancy, third trimester: Secondary | ICD-10-CM

## 2016-10-15 MED ORDER — FAMOTIDINE 20 MG PO TABS: 20.0000 mg | ORAL_TABLET | Freq: Two times a day (BID) | ORAL | 1 refills | Status: DC

## 2016-10-15 NOTE — Progress Notes (Signed)
Prenatal Visit Note Date: 10/15/2016 Clinic: [facility]  Subjective:  Allison Moreno is a 31 y.o. G1P0 at 3614w0d being seen today for ongoing prenatal care.  She is currently monitored for the following issues for this low-risk pregnancy and has Adjustment disorder with anxious mood; ADD (attention deficit disorder) without hyperactivity; Supervision of normal pregnancy, antepartum; Adopted; and Rubella non-immune status, antepartum on her problem list.  Patient reports no complaints.   Contractions: Not present. Vag. Bleeding: None.  Movement: Present. Denies leaking of fluid.   The following portions of the patient's history were reviewed and updated as appropriate: allergies, current medications, past family history, past medical history, past social history, past surgical history and problem list. Problem list updated.  Objective:   Vitals:   10/15/16 1424  BP: 117/85  Pulse: (!) 101  Weight: 228 lb (103.4 kg)    Fetal Status: Fetal Heart Rate (bpm): 156 Fundal Height: 32 cm Movement: Present  Presentation: Vertex  General:  Alert, oriented and cooperative. Patient is in no acute distress.  Skin: Skin is warm and dry. No rash noted.   Cardiovascular: Normal heart rate noted  Respiratory: Normal respiratory effort, no problems with respiration noted  Abdomen: Soft, gravid, appropriate for gestational age. Pain/Pressure: Present    C/d/i incision and healing great. Nttp.   Pelvic:  Cervical exam deferred        Extremities: Normal range of motion.  Edema: None  Mental Status: Normal mood and affect. Normal behavior. Normal judgment and thought content.   Urinalysis:      Assessment and Plan:  Pregnancy: G1P0 at 7314w0d  1. Fundal height low for dates in third trimester [facility] precautions given. Growth u/s sometime in the next week. gbs nv. Works with toddlers with lifting. Okay to write note out for rest of pregnancy given open appy.  - US MFM OB FOLLOW UP;  Future  Preterm labor symptoms and general obstetric precautions including but not limited to vaginal bleeding, contractions, leaking of fluid and fetal movement were reviewed in detail with the patient. Please refer to After Visit Summary for other counseling recommendations.  Return in about 1 week (around 10/22/2016).   Verona BingPickens, Camie Hauss, MD

## 2016-10-18 ENCOUNTER — Encounter: Payer: Medicaid Other | Admitting: Family Medicine

## 2016-10-18 ENCOUNTER — Encounter (INDEPENDENT_AMBULATORY_CARE_PROVIDER_SITE_OTHER): Payer: Self-pay | Admitting: *Deleted

## 2016-10-22 ENCOUNTER — Encounter: Payer: Self-pay | Admitting: *Deleted

## 2016-10-24 ENCOUNTER — Ambulatory Visit (HOSPITAL_COMMUNITY)
Admission: RE | Admit: 2016-10-24 | Discharge: 2016-10-24 | Disposition: A | Discharge status: Home / Self Care | Payer: Medicaid Other | Source: Ambulatory Visit | Attending: Obstetrics and Gynecology | Admitting: Obstetrics and Gynecology

## 2016-10-24 ENCOUNTER — Other Ambulatory Visit (HOSPITAL_COMMUNITY)
Admission: RE | Admit: 2016-10-24 | Discharge: 2016-10-24 | Disposition: A | Discharge status: Home / Self Care | Payer: Medicaid Other | Source: Ambulatory Visit | Attending: Family Medicine | Admitting: Family Medicine

## 2016-10-24 ENCOUNTER — Other Ambulatory Visit: Payer: Self-pay | Admitting: Obstetrics and Gynecology

## 2016-10-24 ENCOUNTER — Ambulatory Visit (INDEPENDENT_AMBULATORY_CARE_PROVIDER_SITE_OTHER): Payer: Medicaid Other | Admitting: Family Medicine

## 2016-10-24 VITALS — BP 140/80 | Wt 239.0 lb

## 2016-10-24 DIAGNOSIS — F988 Other specified behavioral and emotional disorders with onset usually occurring in childhood and adolescence: Secondary | ICD-10-CM

## 2016-10-24 DIAGNOSIS — Z3400 Encounter for supervision of normal first pregnancy, unspecified trimester: Secondary | ICD-10-CM

## 2016-10-24 DIAGNOSIS — Z3A36 36 weeks gestation of pregnancy: Secondary | ICD-10-CM

## 2016-10-24 DIAGNOSIS — O99213 Obesity complicating pregnancy, third trimester: Secondary | ICD-10-CM | POA: Diagnosis present

## 2016-10-24 DIAGNOSIS — O26843 Uterine size-date discrepancy, third trimester: Secondary | ICD-10-CM | POA: Diagnosis not present

## 2016-10-24 DIAGNOSIS — Z283 Underimmunization status: Secondary | ICD-10-CM

## 2016-10-24 DIAGNOSIS — O9989 Other specified diseases and conditions complicating pregnancy, childbirth and the puerperium: Secondary | ICD-10-CM

## 2016-10-24 DIAGNOSIS — Z3403 Encounter for supervision of normal first pregnancy, third trimester: Secondary | ICD-10-CM

## 2016-10-24 DIAGNOSIS — Z0282 Encounter for adoption services: Secondary | ICD-10-CM

## 2016-10-24 DIAGNOSIS — F4322 Adjustment disorder with anxiety: Secondary | ICD-10-CM

## 2016-10-24 DIAGNOSIS — O09899 Supervision of other high risk pregnancies, unspecified trimester: Secondary | ICD-10-CM

## 2016-10-24 LAB — OB RESULTS CONSOLE GBS: GBS: NEGATIVE

## 2016-10-24 NOTE — Progress Notes (Signed)
   PRENATAL VISIT NOTE  Subjective:  Allison Moreno is a 31 y.o. G1P0 at 22w2dbeing seen today for ongoing prenatal care.  She is currently monitored for the following issues for this low-risk pregnancy and has Adjustment disorder with anxious mood; ADD (attention deficit disorder) without hyperactivity; Supervision of high-risk pregnancy; Adopted; Rubella non-immune status, antepartum; small AC; Preeclampsia, third trimester; and [redacted] weeks gestation of pregnancy on her problem list.  Patient reports no complaints.  Contractions: Irregular. Vag. Bleeding: None.  Movement: Present. Denies leaking of fluid.   The following portions of the patient's history were reviewed and updated as appropriate: allergies, current medications, past family history, past medical history, past social history, past surgical history and problem list. Problem list updated.  Objective:   Vitals:   10/24/16 1606  BP: 140/80  Weight: 239 lb (108.4 kg)    Fetal Status:   Fundal Height: 35 cm Movement: Present     General:  Alert, oriented and cooperative. Patient is in no acute distress.  Skin: Skin is warm and dry. No rash noted.   Cardiovascular: Normal heart rate noted  Respiratory: Normal respiratory effort, no problems with respiration noted  Abdomen: Soft, gravid, appropriate for gestational age.  Pain/Pressure: Present     Pelvic: Cervical exam deferred        Extremities: Normal range of motion.  Edema: Moderate pitting, indentation subsides rapidly  Mental Status:  Normal mood and affect. Normal behavior. Normal judgment and thought content.   Assessment and Plan:  Pregnancy: G1P0 at 347w2d1. Supervision of normal first pregnancy, antepartum - Strep Gp B NAA - Cervicovaginal ancillary only - [place]ults are not currently available and MFM did not review the results with the patient at the point of care. Patient waited for results  2. Size dates discrepancy - US[place]ore[place]owed 31st%  AC at the 10th  % - MFM suggested delivery between 39 and 40 wks but did not indicate the reason.   3. Elevated BP, mild- monitor closely. Reviewed warnign s/sx of prex  4. Rubella non-immune status, antepartum MMR postpartum  4. ADD (attention deficit disorder) without hyperactivity 5. Adjustment disorder with anxious mood 6. Constipation-- discussed starting colace and miralax.   Preterm labor symptoms and general obstetric precautions including but not limited to vaginal bleeding, contractions, leaking of fluid and fetal movement were reviewed in detail with the patient. Please refer to After Visit Summary for other counseling recommendations.  Return in about 1 week (around 10/31/2016) for Routine prenatal care.   KiCaren MacadamMD

## 2016-10-24 NOTE — Patient Instructions (Signed)
You have constipation which is hard stools that are difficult to pass. It is important to have regular bowel movements every 1-3 days that are soft and easy to pass. Hard stools increase your risk of hemorrhoids and are very uncomfortable.   To prevent constipation you can increase the amount of fiber in your diet. Examples of foods with fiber are leafy greens, whole grain breads, oatmeal and other grains.  It is also important to drink at least eight 8oz glass of water everyday.   If you have not has a bowel movement in 4-5 days you made need to clean out your bowel.  This will have establish normal movement through your bowel.    - Start Colace100mg  twice daily - Start Miralax once daily- 1 capful daily - Start a daily fiber supplement like metamucil or citrucel - You can safely use enemas in pregnancy  - if you are having diarrhea you can reduce to Colace once a day or miralax every other day or a 1/2 capful daily.

## 2016-10-25 ENCOUNTER — Encounter: Payer: Self-pay | Admitting: Obstetrics and Gynecology

## 2016-10-25 DIAGNOSIS — O365990 Maternal care for other known or suspected poor fetal growth, unspecified trimester, not applicable or unspecified: Secondary | ICD-10-CM

## 2016-10-25 HISTORY — DX: Maternal care for other known or suspected poor fetal growth, unspecified trimester, not applicable or unspecified: O36.5990

## 2016-10-26 LAB — CERVICOVAGINAL ANCILLARY ONLY
Chlamydia: NEGATIVE
NEISSERIA GONORRHEA: NEGATIVE

## 2016-10-26 LAB — STREP GP B NAA: Strep Gp B NAA: NEGATIVE

## 2016-10-30 ENCOUNTER — Ambulatory Visit (INDEPENDENT_AMBULATORY_CARE_PROVIDER_SITE_OTHER): Payer: Medicaid Other | Admitting: Obstetrics & Gynecology

## 2016-10-30 ENCOUNTER — Encounter (HOSPITAL_COMMUNITY): Payer: Self-pay

## 2016-10-30 ENCOUNTER — Encounter (HOSPITAL_COMMUNITY): Payer: Self-pay | Admitting: Certified Registered Nurse Anesthetist

## 2016-10-30 ENCOUNTER — Inpatient Hospital Stay (HOSPITAL_COMMUNITY)
Admission: AD | Admit: 2016-10-30 | Discharge: 2016-11-05 | DRG: 766 | Disposition: A | Discharge status: Home / Self Care | Payer: Medicaid Other | Source: Ambulatory Visit | Attending: Obstetrics & Gynecology | Admitting: Obstetrics & Gynecology

## 2016-10-30 VITALS — BP 150/110 | HR 88 | Wt 241.0 lb

## 2016-10-30 DIAGNOSIS — Z2839 Other underimmunization status: Secondary | ICD-10-CM

## 2016-10-30 DIAGNOSIS — O1403 Mild to moderate pre-eclampsia, third trimester: Secondary | ICD-10-CM | POA: Diagnosis not present

## 2016-10-30 DIAGNOSIS — Z3A37 37 weeks gestation of pregnancy: Secondary | ICD-10-CM | POA: Diagnosis not present

## 2016-10-30 DIAGNOSIS — O1493 Unspecified pre-eclampsia, third trimester: Secondary | ICD-10-CM

## 2016-10-30 DIAGNOSIS — O1414 Severe pre-eclampsia complicating childbirth: Secondary | ICD-10-CM | POA: Diagnosis present

## 2016-10-30 DIAGNOSIS — O1494 Unspecified pre-eclampsia, complicating childbirth: Secondary | ICD-10-CM | POA: Diagnosis present

## 2016-10-30 DIAGNOSIS — O0993 Supervision of high risk pregnancy, unspecified, third trimester: Secondary | ICD-10-CM

## 2016-10-30 DIAGNOSIS — Z882 Allergy status to sulfonamides status: Secondary | ICD-10-CM | POA: Diagnosis not present

## 2016-10-30 DIAGNOSIS — O324XX0 Maternal care for high head at term, not applicable or unspecified: Secondary | ICD-10-CM | POA: Diagnosis present

## 2016-10-30 DIAGNOSIS — O9989 Other specified diseases and conditions complicating pregnancy, childbirth and the puerperium: Secondary | ICD-10-CM

## 2016-10-30 DIAGNOSIS — Z283 Underimmunization status: Secondary | ICD-10-CM

## 2016-10-30 DIAGNOSIS — O1404 Mild to moderate pre-eclampsia, complicating childbirth: Secondary | ICD-10-CM | POA: Diagnosis not present

## 2016-10-30 HISTORY — DX: Unspecified pre-eclampsia, third trimester: O14.93

## 2016-10-30 LAB — COMPREHENSIVE METABOLIC PANEL
ALT: 10 U/L — AB (ref 14–54)
ANION GAP: 8 (ref 5–15)
AST: 17 U/L (ref 15–41)
Albumin: 2.8 g/dL — ABNORMAL LOW (ref 3.5–5.0)
Alkaline Phosphatase: 90 U/L (ref 38–126)
BUN: 17 mg/dL (ref 6–20)
CHLORIDE: 105 mmol/L (ref 101–111)
CO2: 23 mmol/L (ref 22–32)
Calcium: 8.9 mg/dL (ref 8.9–10.3)
Creatinine, Ser: 0.68 mg/dL (ref 0.44–1.00)
GFR calc non Af Amer: >60 mL/min (ref >60)
Glucose, Bld: 89 mg/dL (ref 65–99)
Potassium: 3.8 mmol/L (ref 3.5–5.1)
SODIUM: 136 mmol/L (ref 135–145)
Total Bilirubin: 0.6 mg/dL (ref 0.3–1.2)
Total Protein: 6.7 g/dL (ref 6.5–8.1)

## 2016-10-30 LAB — CBC
HCT: 34 % — ABNORMAL LOW (ref 36.0–46.0)
Hemoglobin: 11.3 g/dL — ABNORMAL LOW (ref 12.0–15.0)
MCH: 28.7 pg (ref 26.0–34.0)
MCHC: 33.2 g/dL (ref 30.0–36.0)
MCV: 86.3 fL (ref 78.0–100.0)
PLATELETS: 155 10*3/uL (ref 150–400)
RBC: 3.94 MIL/uL (ref 3.87–5.11)
RDW: 13.2 % (ref 11.5–15.5)
WBC: 8.1 10*3/uL (ref 4.0–10.5)

## 2016-10-30 LAB — TYPE AND SCREEN
ABO/RH(D): O POS
Antibody Screen: NEGATIVE

## 2016-10-30 MED ORDER — TERBUTALINE SULFATE 1 MG/ML IJ SOLN: 0.2500 mg | Freq: Once | INTRAMUSCULAR | Status: DC | PRN

## 2016-10-30 MED ORDER — SOD CITRATE-CITRIC ACID 500-334 MG/5ML PO SOLN
30.0000 mL | ORAL | Status: DC | PRN
  Administered 2016-11-01: 30 mL via ORAL
  Filled 2016-10-30: qty 15

## 2016-10-30 MED ORDER — LACTATED RINGERS IV SOLN: 500.0000 mL | INTRAVENOUS | Status: DC | PRN

## 2016-10-30 MED ORDER — LIDOCAINE HCL (PF) 1 % IJ SOLN
30.0000 mL | INTRAMUSCULAR | Status: DC | PRN
  Filled 2016-10-30: qty 30

## 2016-10-30 MED ORDER — FENTANYL CITRATE (PF) 100 MCG/2ML IJ SOLN
100.0000 ug | INTRAMUSCULAR | Status: DC | PRN
  Administered 2016-10-31 (×3): 100 ug via INTRAVENOUS
  Filled 2016-10-30 (×4): qty 2

## 2016-10-30 MED ORDER — OXYTOCIN BOLUS FROM INFUSION: 500.0000 mL | Freq: Once | INTRAVENOUS | Status: DC

## 2016-10-30 MED ORDER — OXYTOCIN 40 UNITS IN LACTATED RINGERS INFUSION - SIMPLE MED
2.5000 [IU]/h | INTRAVENOUS | Status: DC
  Filled 2016-10-30: qty 1000

## 2016-10-30 MED ORDER — MISOPROSTOL 50MCG HALF TABLET
50.0000 ug | ORAL_TABLET | ORAL | Status: DC | PRN
  Administered 2016-10-30 – 2016-10-31 (×2): 50 ug via ORAL
  Filled 2016-10-30 (×2): qty 1

## 2016-10-30 MED ORDER — ONDANSETRON HCL 4 MG/2ML IJ SOLN
4.0000 mg | Freq: Four times a day (QID) | INTRAMUSCULAR | Status: DC | PRN
Start: 2016-10-30 — End: 2016-11-05
  Administered 2016-10-31 – 2016-11-01 (×6): 4 mg via INTRAVENOUS
  Filled 2016-10-30 (×6): qty 2

## 2016-10-30 MED ORDER — OXYCODONE-ACETAMINOPHEN 5-325 MG PO TABS: 1.0000 | ORAL_TABLET | ORAL | Status: DC | PRN

## 2016-10-30 MED ORDER — LACTATED RINGERS IV SOLN
INTRAVENOUS | Status: DC
  Administered 2016-10-30 – 2016-11-02 (×6): via INTRAVENOUS

## 2016-10-30 MED ORDER — OXYCODONE-ACETAMINOPHEN 5-325 MG PO TABS: 2.0000 | ORAL_TABLET | ORAL | Status: DC | PRN

## 2016-10-30 MED ORDER — ACETAMINOPHEN 325 MG PO TABS
650.0000 mg | ORAL_TABLET | ORAL | Status: DC | PRN
  Administered 2016-10-31 (×3): 650 mg via ORAL
  Filled 2016-10-30 (×3): qty 2

## 2016-10-30 NOTE — Anesthesia Pain Management Evaluation Note (Signed)
  [facility] Pain Management Visit Note  Patient: Allison Moreno, 31 y.o., female  "Hello I am a member of the anesthesia team at [facility]. We have an anesthesia team available at all times to provide care throughout the hospital, including epidural management and anesthesia for C-section. I don't know your plan for the delivery whether it a natural birth, water birth, IV sedation, nitrous supplementation, doula or epidural, but we want to meet your pain goals."   1.Was your pain managed to your expectations on prior hospitalizations?   No prior hospitalizations  2.What is your expectation for pain management during this hospitalization?     Epidural and IV pain meds  3.How can we help you reach that goal? Patient is aware of all pain control options. Voiced interest in epidural.  Record the patient's initial score and the patient's pain goal.   Pain: 0  Pain Goal: 7 [facility] wants you to be able to say your pain was always managed very well.  Zamari Vea L 10/30/2016

## 2016-10-30 NOTE — Progress Notes (Signed)
   PRENATAL VISIT NOTE  Subjective:  Allison Moreno is a 31 y.o. G1P0 at 315w1d being seen today for ongoing prenatal care.  She is currently monitored for the following issues for this high-risk pregnancy and has Adjustment disorder with anxious mood; ADD (attention deficit disorder) without hyperactivity; Supervision of high-risk pregnancy; Adopted; Rubella non-immune status, antepartum; and small AC on her problem list.  Patient reports no complaints.  Contractions: Irregular. Vag. Bleeding: None.  Movement: Present. Denies leaking of fluid.   The following portions of the patient's history were reviewed and updated as appropriate: allergies, current medications, past family history, past medical history, past social history, past surgical history and problem list. Problem list updated.  Objective:   Vitals:   10/30/16 1549  BP: (!) 150/110  Pulse: 88  Weight: 241 lb (109.3 kg)    Fetal Status: Fetal Heart Rate (bpm): 160 Fundal Height: 37 cm Movement: Present  Presentation: Vertex  General:  Alert, oriented and cooperative. Patient is in no acute distress.  Skin: Skin is warm and dry. No rash noted.   Cardiovascular: Normal heart rate noted  Respiratory: Normal respiratory effort, no problems with respiration noted  Abdomen: Soft, gravid, appropriate for gestational age.  Pain/Pressure: Present     Pelvic: Cervical exam performed Dilation: Closed Effacement (%): Thick Station: Ballotable  Extremities: Normal range of motion.  Edema: Trace  Mental Status:  Normal mood and affect. Normal behavior. Normal judgment and thought content.   NST performed today was reviewed and was found to be reactive.  AFI was also normal.  Continue recommended antenatal testing and prenatal care.  Assessment and Plan:  Pregnancy: G1P0 at 315w1d  1. Preeclampsia, third trimester Will send to L&D for delivery, on call team notified.  2. Supervision of high risk pregnancy in third trimester Term  labor symptoms and general obstetric precautions including but not limited to vaginal bleeding, contractions, leaking of fluid and fetal movement were reviewed in detail with the patient. Please refer to After Visit Summary for other counseling recommendations.  Return in about 4 weeks (around 11/27/2016) for Postpartum check.   Jaynie CollinsUgonna Melaina Howerton, MD

## 2016-10-30 NOTE — Patient Instructions (Signed)
Return to clinic for any scheduled appointments or obstetric concerns, or go to [facility] for evaluation  

## 2016-10-30 NOTE — H&P (Signed)
Faculty Practice H&P  Allison Moreno is a 31 y.o. female G1P0 with IUP at 34w1dpresenting for IOL for preeclampsia. Pregnancy was been complicated by small AC, Rubella non-immune, ADD, appendicitis with appendectomy at 31 weeks..    Pt states she has been having no contractions, no vaginal bleeding, intact membranes, with normal fetal movement.     Prenatal Course Source of Care: [facility] with onset of care at 105WPVXY Pregnancy complications or risks: Patient Active Problem List   Diagnosis Date Noted  . Preeclampsia, third trimester 10/30/2016  . [redacted] weeks gestation of pregnancy 10/30/2016  . small AC 10/25/2016  . Rubella non-immune status, antepartum 05/04/2016  . Supervision of high-risk pregnancy 05/03/2016  . Adopted 05/03/2016  . Adjustment disorder with anxious mood 12/21/2014  . 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  She plans to breastfeed  Prenatal labs and studies: ABO, Rh: --/--/O POS, O POS (06/29 1147) Antibody: NEG (06/29 1147) Rubella: !Error! RPR: Non Reactive (06/07 0824)  HBsAg: Negative (02/01 1617)  HIV:    GBS: Negative (07/25 1627)  2hr Glucola: negative Genetic screening: declined Anatomy [place] normal  Past Medical History:  Past Medical History:  Diagnosis Date  . ADD (attention deficit disorder)   . Appendicitis 09/28/2016  . Chronic ear infection   . Depression   . 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 APPENDECTOMY N/A 09/28/2016   Procedure: APPENDECTOMY;  Surgeon: RJackolyn Confer MD;  Location: [facility];  Service: General;  Laterality: N/A;  . TONSILLECTOMY    . TYMPANOPLASTY Right   . WISDOM TOOTH EXTRACTION      Obstetrical History:  OB History    Gravida Para Term Preterm AB Living   1             SAB TAB  Ectopic Multiple Live Births                  Gynecological History:  OB History    Gravida Para Term Preterm AB Living   1             SAB TAB Ectopic Multiple Live Births                  Social History:  Social History   Social History  . Marital status: Single    Spouse name: N/A  . Number of children: N/A  . Years of education: N/A   Social History Main Topics  . Smoking status: Never Smoker  . Smokeless tobacco: Never Used  . Alcohol use 0.0 oz/week  . Drug use: No  . Sexual activity: Yes    Partners: Male    Birth control/ protection: None   Other Topics Concern  . None   Social History Narrative  . None    Family History:  Family History  Problem Relation Age of Onset  . Depression Mother   . Diabetes Father     Medications:  Prenatal vitamins,  Current Facility-Administered Medications  Medication Dose Route Frequency Provider Last Rate Last Dose  . acetaminophen (TYLENOL) tablet 650 mg  650 mg Oral Q4H PRN STruett Mainland DO      . fentaNYL (SUBLIMAZE) injection 100 mcg  100 mcg Intravenous Q1H PRN STruett Mainland DO      .  lactated ringers infusion 500-1,000 mL  500-1,000 mL Intravenous PRN Truett Mainland, DO      . lactated ringers infusion   Intravenous Continuous Truett Mainland, DO      . lidocaine (PF) (XYLOCAINE) 1 % injection 30 mL  30 mL Subcutaneous PRN Truett Mainland, DO      . misoprostol (CYTOTEC) tablet 50 mcg  50 mcg Oral Q4H PRN Truett Mainland, DO      . ondansetron [facility]) injection 4 mg  4 mg Intravenous Q6H PRN Truett Mainland, DO      . oxyCODONE-acetaminophen (PERCOCET/ROXICET) 5-325 MG per tablet 1 tablet  1 tablet Oral Q4H PRN Truett Mainland, DO      . oxyCODONE-acetaminophen (PERCOCET/ROXICET) 5-325 MG per tablet 2 tablet  2 tablet Oral Q4H PRN Truett Mainland, DO      . oxytocin (PITOCIN) IV BOLUS FROM BAG  500 mL Intravenous Once Stinson, Jacob J, DO      . oxytocin (PITOCIN) IV infusion 40 units in LR 1000  mL - Premix  2.5 Units/hr Intravenous Continuous Truett Mainland, DO      . sodium citrate-citric acid (ORACIT) solution 30 mL  30 mL Oral Q2H PRN Truett Mainland, DO      . terbutaline (BRETHINE) injection 0.25 mg  0.25 mg Subcutaneous Once PRN Truett Mainland, DO        Allergies:  Allergies  Allergen Reactions  . Sulfa Antibiotics Other (See Comments)    Reaction:  Stevens-Johnson Syndrome     Review of Systems: - negative  Physical Exam: Blood pressure (!) 147/102, pulse 95, temperature 97.6 F (36.4 C), temperature source Oral, resp. rate 16, height 5' 6" (1.676 m), weight 241 lb (109.3 kg), last menstrual period 02/13/2016. GENERAL: Well-developed, well-nourished female in no acute distress.  LUNGS: Clear to auscultation bilaterally.  HEART: Regular rate and rhythm. ABDOMEN: Soft, nontender, nondistended, gravid. EFW 5#10oz lbs EXTREMITIES: Nontender, no edema, 2+ distal pulses. Cervical Exam: closed, thick, high Presentation: Vertex Exam by:: Dr. Nehemiah Settle FHT:  Category 1 tracing.  No contractions   Pertinent Labs/Studies:   Lab Results  Component Value Date   WBC 15.7 (H) 09/27/2016   HGB 12.3 09/27/2016   HCT 36.0 09/27/2016   MCV 85.9 09/27/2016   PLT 172 09/27/2016    Assessment : Allison Moreno is a 31 y.o. G1P0 at 6w1dbeing admitted for IOL secondary to preeclampsia without severe features    Plan:  Cytotec  Breastfeed  CBC, CMP, UP:C  MMR postpartum  Watch BPs closely.   JTruett Mainland DO 10/30/2016, 8:49 PM

## 2016-10-31 ENCOUNTER — Inpatient Hospital Stay (HOSPITAL_COMMUNITY): Payer: Medicaid Other | Admitting: Anesthesiology

## 2016-10-31 LAB — COMPREHENSIVE METABOLIC PANEL
ALBUMIN: 2.6 g/dL — AB (ref 3.5–5.0)
ALK PHOS: 91 U/L (ref 38–126)
ALT: 10 U/L — AB (ref 14–54)
ALT: 11 U/L — ABNORMAL LOW (ref 14–54)
ANION GAP: 9 (ref 5–15)
AST: 17 U/L (ref 15–41)
AST: 22 U/L (ref 15–41)
Albumin: 2.8 g/dL — ABNORMAL LOW (ref 3.5–5.0)
Alkaline Phosphatase: 90 U/L (ref 38–126)
Anion gap: 8 (ref 5–15)
BILIRUBIN TOTAL: 0.5 mg/dL (ref 0.3–1.2)
BUN: 17 mg/dL (ref 6–20)
BUN: 13 mg/dL (ref 6–20)
CALCIUM: 7.9 mg/dL — AB (ref 8.9–10.3)
CHLORIDE: 105 mmol/L (ref 101–111)
CO2: 22 mmol/L (ref 22–32)
CO2: 21 mmol/L — ABNORMAL LOW (ref 22–32)
CREATININE: 0.71 mg/dL (ref 0.44–1.00)
Calcium: 8.8 mg/dL — ABNORMAL LOW (ref 8.9–10.3)
Chloride: 104 mmol/L (ref 101–111)
Creatinine, Ser: 0.81 mg/dL (ref 0.44–1.00)
GFR calc Af Amer: >60 mL/min (ref >60)
GFR calc Af Amer: >60 mL/min (ref >60)
GLUCOSE: 88 mg/dL (ref 65–99)
Glucose, Bld: 103 mg/dL — ABNORMAL HIGH (ref 65–99)
POTASSIUM: 3.9 mmol/L (ref 3.5–5.1)
Potassium: 4 mmol/L (ref 3.5–5.1)
Sodium: 135 mmol/L (ref 135–145)
Sodium: 134 mmol/L — ABNORMAL LOW (ref 135–145)
TOTAL PROTEIN: 6.4 g/dL — AB (ref 6.5–8.1)
Total Bilirubin: 0.4 mg/dL (ref 0.3–1.2)
Total Protein: 5.9 g/dL — ABNORMAL LOW (ref 6.5–8.1)

## 2016-10-31 LAB — CBC
HCT: 33.6 % — ABNORMAL LOW (ref 36.0–46.0)
HEMATOCRIT: 33.3 % — AB (ref 36.0–46.0)
HEMATOCRIT: 35.6 % — AB (ref 36.0–46.0)
HEMATOCRIT: 34.5 % — AB (ref 36.0–46.0)
HEMOGLOBIN: 11.1 g/dL — AB (ref 12.0–15.0)
HEMOGLOBIN: 11.8 g/dL — AB (ref 12.0–15.0)
HEMOGLOBIN: 11.4 g/dL — AB (ref 12.0–15.0)
Hemoglobin: 11 g/dL — ABNORMAL LOW (ref 12.0–15.0)
MCH: 28.6 pg (ref 26.0–34.0)
MCH: 28.7 pg (ref 26.0–34.0)
MCH: 29 pg (ref 26.0–34.0)
MCH: 28.8 pg (ref 26.0–34.0)
MCHC: 33 g/dL (ref 30.0–36.0)
MCHC: 33 g/dL (ref 30.0–36.0)
MCHC: 33.1 g/dL (ref 30.0–36.0)
MCHC: 33 g/dL (ref 30.0–36.0)
MCV: 86.7 fL (ref 78.0–100.0)
MCV: 86.8 fL (ref 78.0–100.0)
MCV: 87.5 fL (ref 78.0–100.0)
MCV: 87.1 fL (ref 78.0–100.0)
Platelets: 154 10*3/uL (ref 150–400)
Platelets: 146 10*3/uL — ABNORMAL LOW (ref 150–400)
Platelets: 145 10*3/uL — ABNORMAL LOW (ref 150–400)
Platelets: 155 10*3/uL (ref 150–400)
RBC: 3.84 MIL/uL — ABNORMAL LOW (ref 3.87–5.11)
RBC: 3.87 MIL/uL (ref 3.87–5.11)
RBC: 4.07 MIL/uL (ref 3.87–5.11)
RBC: 3.96 MIL/uL (ref 3.87–5.11)
RDW: 13.2 % (ref 11.5–15.5)
RDW: 13.2 % (ref 11.5–15.5)
RDW: 13.3 % (ref 11.5–15.5)
RDW: 13.2 % (ref 11.5–15.5)
WBC: 7.7 10*3/uL (ref 4.0–10.5)
WBC: 7.9 10*3/uL (ref 4.0–10.5)
WBC: 11.3 10*3/uL — ABNORMAL HIGH (ref 4.0–10.5)
WBC: 11.4 10*3/uL — ABNORMAL HIGH (ref 4.0–10.5)

## 2016-10-31 LAB — PROTEIN / CREATININE RATIO, URINE
CREATININE, URINE: 145 mg/dL
Protein Creatinine Ratio: 0.65 mg/mg{Cre} — ABNORMAL HIGH (ref 0.00–0.15)
Total Protein, Urine: 94 mg/dL

## 2016-10-31 LAB — RPR: RPR: NONREACTIVE

## 2016-10-31 MED ORDER — DIPHENHYDRAMINE HCL 25 MG PO CAPS
25.0000 mg | ORAL_CAPSULE | Freq: Four times a day (QID) | ORAL | Status: DC | PRN
  Administered 2016-10-31 (×2): 25 mg via ORAL
  Filled 2016-10-31 (×2): qty 1

## 2016-10-31 MED ORDER — MAGNESIUM SULFATE 40 G IN LACTATED RINGERS - SIMPLE
2.0000 g/h | INTRAVENOUS | Status: DC
  Administered 2016-11-01 (×2): 2 g/h via INTRAVENOUS
  Filled 2016-10-31 (×2): qty 40

## 2016-10-31 MED ORDER — PHENYLEPHRINE 40 MCG/ML (10ML) SYRINGE FOR IV PUSH (FOR BLOOD PRESSURE SUPPORT)
80.0000 ug | PREFILLED_SYRINGE | INTRAVENOUS | Status: DC | PRN
  Filled 2016-10-31: qty 5

## 2016-10-31 MED ORDER — LACTATED RINGERS IV SOLN
500.0000 mL | Freq: Once | INTRAVENOUS | Status: AC
  Administered 2016-10-31: 500 mL via INTRAVENOUS

## 2016-10-31 MED ORDER — EPHEDRINE 5 MG/ML INJ
10.0000 mg | INTRAVENOUS | Status: DC | PRN
  Filled 2016-10-31: qty 2

## 2016-10-31 MED ORDER — LIDOCAINE HCL (PF) 1 % IJ SOLN
INTRAMUSCULAR | Status: DC | PRN
  Administered 2016-10-31 (×2): 5 mL via EPIDURAL

## 2016-10-31 MED ORDER — LABETALOL HCL 5 MG/ML IV SOLN
20.0000 mg | INTRAVENOUS | Status: AC | PRN
  Administered 2016-10-31: 80 mg via INTRAVENOUS
  Administered 2016-10-31: 20 mg via INTRAVENOUS
  Administered 2016-10-31: 40 mg via INTRAVENOUS
  Filled 2016-10-31: qty 8
  Filled 2016-10-31: qty 4
  Filled 2016-10-31: qty 16

## 2016-10-31 MED ORDER — DIPHENHYDRAMINE HCL 50 MG/ML IJ SOLN: 12.5000 mg | INTRAMUSCULAR | Status: DC | PRN

## 2016-10-31 MED ORDER — HYDRALAZINE HCL 20 MG/ML IJ SOLN: 10.0000 mg | Freq: Once | INTRAMUSCULAR | Status: DC | PRN

## 2016-10-31 MED ORDER — PHENYLEPHRINE 40 MCG/ML (10ML) SYRINGE FOR IV PUSH (FOR BLOOD PRESSURE SUPPORT)
PREFILLED_SYRINGE | INTRAVENOUS | Status: AC
  Filled 2016-10-31: qty 20

## 2016-10-31 MED ORDER — FENTANYL 2.5 MCG/ML BUPIVACAINE 1/10 % EPIDURAL INFUSION (WH - ANES)
INTRAMUSCULAR | Status: AC
  Filled 2016-10-31: qty 100

## 2016-10-31 MED ORDER — HYDRALAZINE HCL 20 MG/ML IJ SOLN: 5.0000 mg | INTRAMUSCULAR | Status: DC | PRN

## 2016-10-31 MED ORDER — FENTANYL 2.5 MCG/ML BUPIVACAINE 1/10 % EPIDURAL INFUSION (WH - ANES)
14.0000 mL/h | INTRAMUSCULAR | Status: DC | PRN
  Administered 2016-10-31 – 2016-11-01 (×3): 14 mL/h via EPIDURAL
  Filled 2016-10-31 (×3): qty 100

## 2016-10-31 MED ORDER — TERBUTALINE SULFATE 1 MG/ML IJ SOLN: 0.2500 mg | Freq: Once | INTRAMUSCULAR | Status: DC | PRN

## 2016-10-31 MED ORDER — OXYTOCIN 40 UNITS IN LACTATED RINGERS INFUSION - SIMPLE MED
1.0000 m[IU]/min | INTRAVENOUS | Status: DC
  Administered 2016-10-31: 2 m[IU]/min via INTRAVENOUS
  Administered 2016-11-01: 32 m[IU]/min via INTRAVENOUS
  Administered 2016-11-01: 20 m[IU]/min via INTRAVENOUS
  Filled 2016-10-31: qty 1000

## 2016-10-31 MED ORDER — MAGNESIUM SULFATE BOLUS VIA INFUSION
4.0000 g | Freq: Once | INTRAVENOUS | Status: AC
  Administered 2016-10-31: 4 g via INTRAVENOUS
  Filled 2016-10-31: qty 500

## 2016-10-31 NOTE — Progress Notes (Signed)
LABOR PROGRESS NOTE  Allison Moreno is a 31 y.o. G1P0 at 614w2d  admitted for IOL for pre-eclampsia  Subjective: Patient reports overall doing well, but does have a slight headache. Denies visual disturbances/double vision, RUQ or any other concerns  Objective: BP (!) 146/115   Pulse 82   Temp 97.6 F (36.4 C) (Oral)   Resp 16   Ht 5\' 6"  (1.676 m)   Wt 241 lb (109.3 kg)   LMP 02/13/2016   BMI 38.90 kg/m  or  Vitals:   10/31/16 0937 10/31/16 0953 10/31/16 1000 10/31/16 1008  BP: (!) 176/113 (!) 165/99  (!) 146/115  Pulse: 63 61  82  Resp: 18  16   Temp:      TempSrc:      Weight:      Height:        NAD. Patellar DTRs 2+ bilaterally. No clonus.   Last SVE.  Dilation: 1 Effacement (%): 50 Station: -3 Presentation: Vertex Exam by:: Calpine CorporationStinson  FHT: baseline rate 140, moderate varibility, +acel, no decel Toco: irregular  Labs: Lab Results  Component Value Date   WBC 7.9 10/31/2016   HGB 11.1 (L) 10/31/2016   HCT 33.6 (L) 10/31/2016   MCV 86.8 10/31/2016   PLT 146 (L) 10/31/2016     Assessment / Plan: 31 y.o. G1P0 at 224w2d here for IOL for pre-eclampsia.   Pre-eclampsia: now with severe BP x 2. Treating BP per labetalol protocol. Will start IV Mag. Labor: cervical ripening - s/p cytotec x 3, FB placed 0715.  Fetal Wellbeing:  Cat I Pain Control:  IV Fentanyl. May have epidural when in active labor Anticipated MOD:  SVD  Frederik PearJulie P Degele, MD 10/31/2016, 10:09 AM

## 2016-10-31 NOTE — Progress Notes (Signed)
Allison Moreno is a 31 y.o. G1P0 at 6615w2d by  admitted for induction of labor due to Pre-eclamptic toxemia of pregnancy..  Subjective:   Objective: BP (!) 144/99   Pulse 89   Temp 98 F (36.7 C) (Oral)   Resp 20   Ht 5\' 6"  (1.676 m)   Wt 241 lb (109.3 kg)   LMP 02/13/2016   SpO2 99%   BMI 38.90 kg/m  I/O last 3 completed shifts: In: 1871.9 [P.O.:60; I.V.:1749.4; Other:62.5] Out: 375 [Urine:375] No intake/output data recorded.  FHT:  FHR: 135 bpm, variability: minimal ,  accelerations:  Abscent,  decelerations:  Absent UC:   regular, every 3 minutes SVE:   Dilation: 5.5 Effacement (%): 80 Station: -2 Exam by:: Allison Moreno  Labs: Lab Results  Component Value Date   WBC 11.4 (H) 10/31/2016   HGB 11.4 (L) 10/31/2016   HCT 34.5 (L) 10/31/2016   MCV 87.1 10/31/2016   PLT 155 10/31/2016    Assessment / Plan: Induction of labor due to preeclampsia,  progressing well on pitocin  Labor: Progressing on Pitocin, will continue to increase then AROM AROM performed Preeclampsia:  on magnesium sulfate Fetal Wellbeing:  Category I Pain Control:  Epidural I/D:   Anticipated MOD:  NSVD  Allison Moreno,LUTHER H 10/31/2016, 10:04 PM

## 2016-10-31 NOTE — Anesthesia Procedure Notes (Signed)
Epidural Patient location during procedure: OB Start time: 10/31/2016 2:40 PM End time: 10/31/2016 2:50 PM  Staffing Anesthesiologist: Everline Mahaffy  Preanesthetic Checklist Completed: patient identified, site marked, surgical consent, pre-op evaluation, timeout performed, IV checked, risks and benefits discussed and monitors and equipment checked  Epidural Patient position: sitting Prep: site prepped and draped and DuraPrep Patient monitoring: continuous pulse ox and blood pressure Approach: midline Location: L3-L4 Injection technique: LOR air  Needle:  Needle type: Tuohy  Needle gauge: 17 G Needle length: 9 cm and 9 Needle insertion depth: 8 cm Catheter type: closed end flexible Catheter size: 19 Gauge Catheter at skin depth: 13 cm Test dose: negative  Assessment Events: blood not aspirated, injection not painful, no injection resistance, negative IV test and no paresthesia

## 2016-10-31 NOTE — Progress Notes (Signed)
LABOR PROGRESS NOTE  Allison Moreno is a 31 y.o. G1P0 at 3836w2d  admitted for IOL for pre-eclampsia  Subjective: Patient reports overall doing well. Has some HA and nausea. Denies visual disturbances, RUQ or any other concerns.   Objective: BP (!) 143/103   Pulse 79   Temp (!) 97.4 F (36.3 C) (Oral)   Resp 18   Ht 5\' 6"  (1.676 m)   Wt 241 lb (109.3 kg)   LMP 02/13/2016   SpO2 99%   BMI 38.90 kg/m  or  Vitals:   10/31/16 1600 10/31/16 1630 10/31/16 1700 10/31/16 1730  BP: (!) 139/96 130/88 123/88 (!) 143/103  Pulse: 77 88 84 79  Resp: 16  18   Temp: (!) 97.4 F (36.3 C)     TempSrc: Oral     SpO2:      Weight:      Height:        NAD. Trace pedal edema. Patellar DTRs 2+ bilaterally. No clonus.    Dilation: 5 Effacement (%): 80 Cervical Position: Anterior Station: Ballotable Presentation: Vertex Exam by:: Dr. Nira Retortegele FHT: baseline rate 130, moderate varibility, +acel, rare variable Toco: q 2-4 min  Labs: Lab Results  Component Value Date   WBC 11.3 (H) 10/31/2016   HGB 11.8 (L) 10/31/2016   HCT 35.6 (L) 10/31/2016   MCV 87.5 10/31/2016   PLT 145 (L) 10/31/2016     Assessment / Plan: 31 y.o. G1P0 at 3136w2d here for IOL for pre-eclampsia. Pre-E with severe feature with sever-range BPs, now on IV Mag. Most recent Bps normal to mild rangve  Pre-eclampsia: continue  IV Mag. Continue to monitor BP.  Labor: S/p cytotec and FB. Now on IV Pitocin. Continue to titrate Fetal Wellbeing:  Cat II with rare variable Pain Control:  IV Fentanyl. May have epidural when in active labor Anticipated MOD:  SVD  Frederik PearJulie P Degele, MD 10/31/2016, 5:37 PM

## 2016-11-01 ENCOUNTER — Encounter (HOSPITAL_COMMUNITY): Payer: Self-pay | Admitting: Anesthesiology

## 2016-11-01 ENCOUNTER — Encounter (HOSPITAL_COMMUNITY)
Admission: AD | Disposition: A | Discharge status: Home / Self Care | Payer: Self-pay | Source: Ambulatory Visit | Attending: Obstetrics & Gynecology

## 2016-11-01 DIAGNOSIS — Z3A37 37 weeks gestation of pregnancy: Secondary | ICD-10-CM

## 2016-11-01 DIAGNOSIS — O1404 Mild to moderate pre-eclampsia, complicating childbirth: Secondary | ICD-10-CM

## 2016-11-01 LAB — COMPREHENSIVE METABOLIC PANEL
ALBUMIN: 2.3 g/dL — AB (ref 3.5–5.0)
ALK PHOS: 78 U/L (ref 38–126)
ALT: 10 U/L — AB (ref 14–54)
AST: 28 U/L (ref 15–41)
Anion gap: 9 (ref 5–15)
BILIRUBIN TOTAL: 0.4 mg/dL (ref 0.3–1.2)
BUN: 14 mg/dL (ref 6–20)
CALCIUM: 6.8 mg/dL — AB (ref 8.9–10.3)
CO2: 22 mmol/L (ref 22–32)
CREATININE: 0.92 mg/dL (ref 0.44–1.00)
Chloride: 102 mmol/L (ref 101–111)
GFR calc Af Amer: >60 mL/min (ref >60)
GFR calc non Af Amer: >60 mL/min (ref >60)
GLUCOSE: 140 mg/dL — AB (ref 65–99)
Potassium: 4 mmol/L (ref 3.5–5.1)
SODIUM: 133 mmol/L — AB (ref 135–145)
TOTAL PROTEIN: 5.6 g/dL — AB (ref 6.5–8.1)

## 2016-11-01 LAB — CBC
HEMATOCRIT: 31.7 % — AB (ref 36.0–46.0)
HEMOGLOBIN: 10.6 g/dL — AB (ref 12.0–15.0)
MCH: 29 pg (ref 26.0–34.0)
MCHC: 33.4 g/dL (ref 30.0–36.0)
MCV: 86.6 fL (ref 78.0–100.0)
Platelets: 178 10*3/uL (ref 150–400)
RBC: 3.66 MIL/uL — AB (ref 3.87–5.11)
RDW: 13.4 % (ref 11.5–15.5)
WBC: 19 10*3/uL — ABNORMAL HIGH (ref 4.0–10.5)

## 2016-11-01 SURGERY — Surgical Case
Anesthesia: Epidural | Site: Abdomen | Wound class: Clean Contaminated

## 2016-11-01 MED ORDER — DIBUCAINE 1 % RE OINT: 1.0000 "application " | TOPICAL_OINTMENT | RECTAL | Status: DC | PRN

## 2016-11-01 MED ORDER — SIMETHICONE 80 MG PO CHEW
80.0000 mg | CHEWABLE_TABLET | Freq: Three times a day (TID) | ORAL | Status: DC
  Administered 2016-11-01 – 2016-11-05 (×10): 80 mg via ORAL
  Filled 2016-11-01 (×10): qty 1

## 2016-11-01 MED ORDER — SODIUM CHLORIDE 0.9% FLUSH: 3.0000 mL | INTRAVENOUS | Status: DC | PRN

## 2016-11-01 MED ORDER — PHENYLEPHRINE HCL 10 MG/ML IJ SOLN
INTRAMUSCULAR | Status: DC | PRN
  Administered 2016-11-01 (×3): 80 ug via INTRAVENOUS

## 2016-11-01 MED ORDER — POLYETHYLENE GLYCOL 3350 17 G PO PACK
17.0000 g | PACK | Freq: Every day | ORAL | Status: DC
  Administered 2016-11-02 – 2016-11-05 (×4): 17 g via ORAL
  Filled 2016-11-01 (×4): qty 1

## 2016-11-01 MED ORDER — NALBUPHINE HCL 10 MG/ML IJ SOLN
5.0000 mg | Freq: Once | INTRAMUSCULAR | Status: AC | PRN
  Administered 2016-11-02: 5 mg via INTRAVENOUS
  Filled 2016-11-01: qty 1

## 2016-11-01 MED ORDER — SOD CITRATE-CITRIC ACID 500-334 MG/5ML PO SOLN: 30.0000 mL | ORAL | Status: DC

## 2016-11-01 MED ORDER — ONDANSETRON HCL 4 MG/2ML IJ SOLN: 4.0000 mg | Freq: Three times a day (TID) | INTRAMUSCULAR | Status: DC | PRN

## 2016-11-01 MED ORDER — PRENATAL MULTIVITAMIN CH
1.0000 | ORAL_TABLET | Freq: Every day | ORAL | Status: DC
  Administered 2016-11-02 – 2016-11-04 (×3): 1 via ORAL
  Filled 2016-11-01 (×3): qty 1

## 2016-11-01 MED ORDER — PHENYLEPHRINE 40 MCG/ML (10ML) SYRINGE FOR IV PUSH (FOR BLOOD PRESSURE SUPPORT)
PREFILLED_SYRINGE | INTRAVENOUS | Status: AC
  Filled 2016-11-01: qty 10

## 2016-11-01 MED ORDER — SCOPOLAMINE 1 MG/3DAYS TD PT72
MEDICATED_PATCH | TRANSDERMAL | Status: AC
  Filled 2016-11-01: qty 1

## 2016-11-01 MED ORDER — DEXAMETHASONE SODIUM PHOSPHATE 4 MG/ML IJ SOLN
INTRAMUSCULAR | Status: AC
  Filled 2016-11-01: qty 1

## 2016-11-01 MED ORDER — NALBUPHINE HCL 10 MG/ML IJ SOLN: 5.0000 mg | INTRAMUSCULAR | Status: DC | PRN

## 2016-11-01 MED ORDER — METOCLOPRAMIDE HCL 5 MG/ML IJ SOLN
INTRAMUSCULAR | Status: AC
  Filled 2016-11-01: qty 2

## 2016-11-01 MED ORDER — SCOPOLAMINE 1 MG/3DAYS TD PT72: 1.0000 | MEDICATED_PATCH | Freq: Once | TRANSDERMAL | Status: DC

## 2016-11-01 MED ORDER — DIPHENHYDRAMINE HCL 50 MG/ML IJ SOLN
12.5000 mg | INTRAMUSCULAR | Status: DC | PRN
  Administered 2016-11-02: 12.5 mg via INTRAVENOUS
  Filled 2016-11-01: qty 1

## 2016-11-01 MED ORDER — METOCLOPRAMIDE HCL 5 MG/ML IJ SOLN: 10.0000 mg | Freq: Once | INTRAMUSCULAR | Status: DC | PRN

## 2016-11-01 MED ORDER — OXYTOCIN 10 UNIT/ML IJ SOLN
INTRAMUSCULAR | Status: AC
  Filled 2016-11-01: qty 4

## 2016-11-01 MED ORDER — HYDRALAZINE HCL 20 MG/ML IJ SOLN: 10.0000 mg | Freq: Once | INTRAMUSCULAR | Status: DC | PRN

## 2016-11-01 MED ORDER — OXYTOCIN 10 UNIT/ML IJ SOLN
INTRAVENOUS | Status: DC | PRN
  Administered 2016-11-01: 40 [IU] via INTRAVENOUS

## 2016-11-01 MED ORDER — DIPHENHYDRAMINE HCL 50 MG/ML IJ SOLN
INTRAMUSCULAR | Status: DC | PRN
  Administered 2016-11-01: 25 mg via INTRAVENOUS

## 2016-11-01 MED ORDER — MAGNESIUM SULFATE 40 G IN LACTATED RINGERS - SIMPLE
2.0000 g/h | INTRAVENOUS | Status: AC
  Administered 2016-11-01: 2 g/h via INTRAVENOUS
  Filled 2016-11-01: qty 40

## 2016-11-01 MED ORDER — AZITHROMYCIN 500 MG IV SOLR
500.0000 mg | Freq: Once | INTRAVENOUS | Status: AC
  Administered 2016-11-01: 500 mg via INTRAVENOUS
  Filled 2016-11-01: qty 500

## 2016-11-01 MED ORDER — MENTHOL 3 MG MT LOZG: 1.0000 | LOZENGE | OROMUCOSAL | Status: DC | PRN

## 2016-11-01 MED ORDER — IBUPROFEN 600 MG PO TABS
600.0000 mg | ORAL_TABLET | Freq: Four times a day (QID) | ORAL | Status: DC | PRN
  Administered 2016-11-02 – 2016-11-05 (×6): 600 mg via ORAL
  Filled 2016-11-01 (×6): qty 1

## 2016-11-01 MED ORDER — METOCLOPRAMIDE HCL 5 MG/ML IJ SOLN
INTRAMUSCULAR | Status: DC | PRN
  Administered 2016-11-01: 10 mg via INTRAVENOUS

## 2016-11-01 MED ORDER — ONDANSETRON HCL 4 MG/2ML IJ SOLN
INTRAMUSCULAR | Status: AC
  Filled 2016-11-01: qty 2

## 2016-11-01 MED ORDER — OXYCODONE HCL 5 MG PO TABS
10.0000 mg | ORAL_TABLET | ORAL | Status: DC | PRN
Start: 2016-11-01 — End: 2016-11-05
  Administered 2016-11-02: 10 mg via ORAL
  Filled 2016-11-01: qty 2

## 2016-11-01 MED ORDER — SCOPOLAMINE 1 MG/3DAYS TD PT72
MEDICATED_PATCH | TRANSDERMAL | Status: DC | PRN
  Administered 2016-11-01: 1 via TRANSDERMAL

## 2016-11-01 MED ORDER — MEASLES, MUMPS & RUBELLA VAC ~~LOC~~ INJ: 0.5000 mL | INJECTION | Freq: Once | SUBCUTANEOUS | Status: DC

## 2016-11-01 MED ORDER — OXYTOCIN 40 UNITS IN LACTATED RINGERS INFUSION - SIMPLE MED: 2.5000 [IU]/h | INTRAVENOUS | Status: AC

## 2016-11-01 MED ORDER — NALOXONE HCL 0.4 MG/ML IJ SOLN: 0.4000 mg | INTRAMUSCULAR | Status: DC | PRN

## 2016-11-01 MED ORDER — NALBUPHINE HCL 10 MG/ML IJ SOLN: 5.0000 mg | Freq: Once | INTRAMUSCULAR | Status: AC | PRN

## 2016-11-01 MED ORDER — KETOROLAC TROMETHAMINE 30 MG/ML IJ SOLN: 30.0000 mg | Freq: Four times a day (QID) | INTRAMUSCULAR | Status: AC | PRN

## 2016-11-01 MED ORDER — MORPHINE SULFATE (PF) 0.5 MG/ML IJ SOLN
INTRAMUSCULAR | Status: AC
  Filled 2016-11-01: qty 10

## 2016-11-01 MED ORDER — TETANUS-DIPHTH-ACELL PERTUSSIS 5-2.5-18.5 LF-MCG/0.5 IM SUSP: 0.5000 mL | Freq: Once | INTRAMUSCULAR | Status: DC

## 2016-11-01 MED ORDER — CEFAZOLIN SODIUM-DEXTROSE 2-4 GM/100ML-% IV SOLN
2.0000 g | INTRAVENOUS | Status: AC
  Administered 2016-11-01: 2 g via INTRAVENOUS

## 2016-11-01 MED ORDER — ONDANSETRON HCL 4 MG/2ML IJ SOLN
INTRAMUSCULAR | Status: DC | PRN
  Administered 2016-11-01: 4 mg via INTRAVENOUS

## 2016-11-01 MED ORDER — LACTATED RINGERS IV SOLN: INTRAVENOUS | Status: DC

## 2016-11-01 MED ORDER — MORPHINE SULFATE (PF) 0.5 MG/ML IJ SOLN
INTRAMUSCULAR | Status: DC | PRN
  Administered 2016-11-01: 4 mg via EPIDURAL
  Administered 2016-11-01: .5 mg via EPIDURAL
  Administered 2016-11-01: .5 mg via INTRAVENOUS

## 2016-11-01 MED ORDER — LABETALOL HCL 5 MG/ML IV SOLN
20.0000 mg | INTRAVENOUS | Status: DC | PRN
  Administered 2016-11-01: 20 mg via INTRAVENOUS

## 2016-11-01 MED ORDER — OXYCODONE HCL 5 MG PO TABS
5.0000 mg | ORAL_TABLET | ORAL | Status: DC | PRN
  Administered 2016-11-02: 5 mg via ORAL
  Filled 2016-11-01: qty 1

## 2016-11-01 MED ORDER — LABETALOL HCL 5 MG/ML IV SOLN
INTRAVENOUS | Status: AC
Start: 2016-11-01 — End: 2016-11-02
  Filled 2016-11-01: qty 4

## 2016-11-01 MED ORDER — DIPHENHYDRAMINE HCL 25 MG PO CAPS: 25.0000 mg | ORAL_CAPSULE | Freq: Four times a day (QID) | ORAL | Status: DC | PRN

## 2016-11-01 MED ORDER — DEXTROSE 5 % IV SOLN: 1.0000 ug/kg/h | INTRAVENOUS | Status: DC | PRN

## 2016-11-01 MED ORDER — DEXAMETHASONE SODIUM PHOSPHATE 4 MG/ML IJ SOLN
INTRAMUSCULAR | Status: DC | PRN
  Administered 2016-11-01: 4 mg via INTRAVENOUS

## 2016-11-01 MED ORDER — DIPHENHYDRAMINE HCL 25 MG PO CAPS: 25.0000 mg | ORAL_CAPSULE | ORAL | Status: DC | PRN

## 2016-11-01 MED ORDER — KETOROLAC TROMETHAMINE 30 MG/ML IJ SOLN
30.0000 mg | Freq: Four times a day (QID) | INTRAMUSCULAR | Status: AC | PRN
  Administered 2016-11-01: 30 mg via INTRAMUSCULAR

## 2016-11-01 MED ORDER — DIPHENHYDRAMINE HCL 50 MG/ML IJ SOLN
INTRAMUSCULAR | Status: AC
  Filled 2016-11-01: qty 1

## 2016-11-01 MED ORDER — KETOROLAC TROMETHAMINE 30 MG/ML IJ SOLN
INTRAMUSCULAR | Status: AC
  Filled 2016-11-01: qty 1

## 2016-11-01 MED ORDER — MEPERIDINE HCL 25 MG/ML IJ SOLN: 6.2500 mg | INTRAMUSCULAR | Status: DC | PRN

## 2016-11-01 MED ORDER — ACETAMINOPHEN 325 MG PO TABS
650.0000 mg | ORAL_TABLET | ORAL | Status: DC | PRN
  Administered 2016-11-02 – 2016-11-05 (×8): 650 mg via ORAL
  Filled 2016-11-01 (×8): qty 2

## 2016-11-01 MED ORDER — WITCH HAZEL-GLYCERIN EX PADS: 1.0000 "application " | MEDICATED_PAD | CUTANEOUS | Status: DC | PRN

## 2016-11-01 MED ORDER — SODIUM BICARBONATE 8.4 % IV SOLN
INTRAVENOUS | Status: DC | PRN
  Administered 2016-11-01 (×2): 5 mL via EPIDURAL

## 2016-11-01 MED ORDER — FENTANYL CITRATE (PF) 100 MCG/2ML IJ SOLN: 25.0000 ug | INTRAMUSCULAR | Status: DC | PRN

## 2016-11-01 MED ORDER — ACETAMINOPHEN 500 MG PO TABS
1000.0000 mg | ORAL_TABLET | Freq: Four times a day (QID) | ORAL | Status: AC
  Administered 2016-11-01 – 2016-11-02 (×3): 1000 mg via ORAL
  Filled 2016-11-01 (×3): qty 2

## 2016-11-01 MED ORDER — COCONUT OIL OIL
1.0000 "application " | TOPICAL_OIL | Status: DC | PRN
  Filled 2016-11-01: qty 120

## 2016-11-01 MED ORDER — LACTATED RINGERS IV SOLN
INTRAVENOUS | Status: DC | PRN
  Administered 2016-11-01: 15:00:00 via INTRAVENOUS

## 2016-11-01 SURGICAL SUPPLY — 29 items
CANISTER SUCT 3000ML PPV (MISCELLANEOUS) ×3 IMPLANT
CHLORAPREP W/TINT 26ML (MISCELLANEOUS) ×3 IMPLANT
DERMABOND ADVANCED (GAUZE/BANDAGES/DRESSINGS) ×2
DERMABOND ADVANCED .7 DNX12 (GAUZE/BANDAGES/DRESSINGS) ×1 IMPLANT
DRSG OPSITE POSTOP 4X10 (GAUZE/BANDAGES/DRESSINGS) ×3 IMPLANT
ELECT REM PT RETURN 9FT ADLT (ELECTROSURGICAL) ×3
ELECTRODE REM PT RTRN 9FT ADLT (ELECTROSURGICAL) ×1 IMPLANT
EXTRACTOR VACUUM KIWI (MISCELLANEOUS) ×3 IMPLANT
GLOVE BIOGEL PI IND STRL 7.0 (GLOVE) ×2 IMPLANT
GLOVE BIOGEL PI IND STRL 7.5 (GLOVE) ×1 IMPLANT
GLOVE BIOGEL PI INDICATOR 7.0 (GLOVE) ×4
GLOVE BIOGEL PI INDICATOR 7.5 (GLOVE) ×2
GLOVE SKINSENSE NS SZ7.0 (GLOVE) ×2
GLOVE SKINSENSE STRL SZ7.0 (GLOVE) ×1 IMPLANT
GOWN STRL REUS W/ TWL LRG LVL3 (GOWN DISPOSABLE) ×2 IMPLANT
GOWN STRL REUS W/ TWL XL LVL3 (GOWN DISPOSABLE) ×1 IMPLANT
GOWN STRL REUS W/TWL LRG LVL3 (GOWN DISPOSABLE) ×4
GOWN STRL REUS W/TWL XL LVL3 (GOWN DISPOSABLE) ×2
NS IRRIG 1000ML POUR BTL (IV SOLUTION) ×3 IMPLANT
PACK C SECTION WH (CUSTOM PROCEDURE TRAY) ×3 IMPLANT
PAD OB MATERNITY 4.3X12.25 (PERSONAL CARE ITEMS) ×3 IMPLANT
PAD PREP 24X48 CUFFED NSTRL (MISCELLANEOUS) ×3 IMPLANT
SUT MON AB 4-0 PS1 27 (SUTURE) ×3 IMPLANT
SUT MON AB-0 CT1 36 (SUTURE) ×6 IMPLANT
SUT PLAIN 2 0 (SUTURE) ×2
SUT PLAIN ABS 2-0 CT1 27XMFL (SUTURE) ×1 IMPLANT
SUT VIC AB 0 CT1 36 (SUTURE) ×6 IMPLANT
SUT VIC AB 3-0 CT1 27 (SUTURE) ×2
SUT VIC AB 3-0 CT1 TAPERPNT 27 (SUTURE) ×1 IMPLANT

## 2016-11-01 NOTE — Progress Notes (Signed)
Patient ID: Allison Moreno, female   DOB: 07/24/1985, 31 y.o.   MRN: [id number]  Comfortable w/ epidural; on mag sulfate  BPs 142/98, other VSS FHR 120s, decreased variability, no decels Ctx q 4 mins w/ Pit @ 7526mu/min; MVUs have been adequate mostly Cx deferred, but was 9/100/-1@ 0630  IUP@term  Pre-e Active labor/transition  Plan to check cx in 1-2 hrs or with increased pressure Anticipate SVD Will need mag x 24h PP  SHAW, KIMBERLY CNM 11/01/2016 9:24 AM

## 2016-11-01 NOTE — Progress Notes (Signed)
L&D Note  11/01/2016 - 11:00 AM  31 y.o. G1P0 6862w3d. Pregnancy complicated by mild pre-eclampsia-->severe, small AC  Patient Active Problem List   Diagnosis Date Noted  . Preeclampsia, third trimester 10/30/2016  . [redacted] weeks gestation of pregnancy 10/30/2016  . small AC 10/25/2016  . Rubella non-immune status, antepartum 05/04/2016  . Supervision of high-risk pregnancy 05/03/2016  . Adopted 05/03/2016  . Adjustment disorder with anxious mood 12/21/2014  . 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 is admitted for IOL   Subjective:  Patient tired. No s/s of pre-x  Objective:   Vitals:   11/01/16 0900 11/01/16 0930 11/01/16 1000 11/01/16 1030  BP: (!) 136/92 (!) 139/98 (!) 140/96 (!) 136/97  Pulse: 95 87 93 92  Resp: 16 16 16 18   Temp: (!) 97.5 F (36.4 C)     TempSrc: Oral     SpO2:      Weight:      Height:        Current Vital Signs 24h Vital Sign Ranges  T (!) 97.5 F (36.4 C) Temp  Avg: 97.9 F (36.6 C)  Min: 97.4 F (36.3 C)  Max: 98.2 F (36.8 C)  BP (!) 136/97 BP  Min: 113/81  Max: 163/107  HR 92 Pulse  Avg: 85.4  Min: 75  Max: 99  RR 18 Resp  Avg: 17.6  Min: 16  Max: 20  SaO2 99 %   SpO2  Avg: 99 %  Min: 99 %  Max: 99 %       24 Hour I/O Current Shift I/O  Time Ins Outs 08/01 0701 - 08/02 0700 In: 2594.9 [P.O.:220; I.V.:2312.4] Out: 2225 [Urine:2225] 08/02 0701 - 08/02 1900 In: 1889.3 [I.V.:1679.3] Out: 225 [Urine:225]   FHR: 130 baseline, no accels, +slight early decels, mod variability Toco: q6613m Gen: NAD SVE: complete, +1 BPD, feels DOA  Labs:   Recent Labs Lab 10/31/16 0832 10/31/16 1427 10/31/16 2023  WBC 7.9 11.3* 11.4*  HGB 11.1* 11.8* 11.4*  HCT 33.6* 35.6* 34.5*  PLT 146* 145* 155    Recent Labs Lab 10/30/16 2206 10/31/16 0832 10/31/16 2023  NA 136 135 134*  K 3.8 4.0 3.9  CL 105 105 104  CO2 23 22 21*  BUN 17 17 13   CREATININE 0.68 0.71 0.81  CALCIUM 8.9 8.8* 7.9*  PROT  6.7 5.9* 6.4*  BILITOT 0.6 0.4 0.5  ALKPHOS 90 90 91  ALT 10* 10* 11*  AST 17 17 22   GLUCOSE 89 88 103*    Medications Current Facility-Administered Medications  Medication Dose Route Frequency Provider Last Rate Last Dose  . acetaminophen (TYLENOL) tablet 650 mg  650 mg Oral Q4H PRN Levie HeritageStinson, Jacob J, DO   650 mg at 10/31/16 1740  . diphenhydrAMINE (BENADRYL) capsule 25 mg  25 mg Oral Q6H PRN Shirley, [place], DO   25 mg at 10/31/16 0835  . diphenhydrAMINE (BENADRYL) injection 12.5 mg  12.5 mg Intravenous Q15 min PRN Bethena Midgetddono, Ernest, MD      . ePHEDrine injection 10 mg  10 mg Intravenous PRN Bethena Midgetddono, Ernest, MD      . ePHEDrine injection 10 mg  10 mg Intravenous PRN Bethena Midgetddono, Ernest, MD      . fentaNYL (SUBLIMAZE) injection 100 mcg  100 mcg Intravenous Q1H PRN Levie HeritageStinson, Jacob J, DO   100 mcg at 10/31/16 1200  . fentaNYL 2.5 mcg/ml w/bupivacaine 0.1% in NS 100ml epidural infusion (WH-ANES)  14 mL/hr  Epidural Continuous PRN Bethena Midgetddono, Ernest, MD 14 mL/hr at 10/31/16 1443 14 mL/hr at 10/31/16 1443  . hydrALAZINE (APRESOLINE) injection 10 mg  10 mg Intravenous Once PRN Cheral MarkerBooker, Kimberly R, CNM      . lactated ringers infusion 500-1,000 mL  500-1,000 mL Intravenous PRN Levie HeritageStinson, Jacob J, DO      . lactated ringers infusion   Intravenous Continuous Degele, Kandra NicolasJulie P, MD 52 mL/hr at 11/01/16 1046    . lidocaine (PF) (XYLOCAINE) 1 % injection 30 mL  30 mL Subcutaneous PRN Levie HeritageStinson, Jacob J, DO      . magnesium sulfate 40 grams in LR 500 mL OB infusion  2 g/hr Intravenous Continuous Degele, Kandra NicolasJulie P, MD 25 mL/hr at 11/01/16 0700 2 g/hr at 11/01/16 0700  . misoprostol (CYTOTEC) tablet 50 mcg  50 mcg Oral Q4H PRN Levie HeritageStinson, Jacob J, DO   50 mcg at 10/31/16 0118  . ondansetron (ZOFRAN) injection 4 mg  4 mg Intravenous Q6H PRN Levie HeritageStinson, Jacob J, DO   4 mg at 11/01/16 [id number]  . oxyCODONE-acetaminophen (PERCOCET/ROXICET) 5-325 MG per tablet 1 tablet  1 tablet Oral Q4H PRN Levie HeritageStinson, Jacob J, DO      . oxyCODONE-acetaminophen  (PERCOCET/ROXICET) 5-325 MG per tablet 2 tablet  2 tablet Oral Q4H PRN Levie HeritageStinson, Jacob J, DO      . oxytocin (PITOCIN) IV BOLUS FROM BAG  500 mL Intravenous Once Stinson, Jacob J, DO      . oxytocin (PITOCIN) IV infusion 40 units in LR 1000 mL - Premix  2.5 Units/hr Intravenous Continuous Levie HeritageStinson, Jacob J, DO      . oxytocin (PITOCIN) IV infusion 40 units in LR 1000 mL - Premix  1-40 milli-units/min Intravenous Titrated Degele, Kandra NicolasJulie P, MD 48 mL/hr at 11/01/16 1046 32 milli-units/min at 11/01/16 1046  . PHENYLephrine 40 mcg/ml in normal saline Adult IV Push Syringe  80 mcg Intravenous PRN Bethena Midgetddono, Ernest, MD      . PHENYLephrine 40 mcg/ml in normal saline Adult IV Push Syringe  80 mcg Intravenous PRN Bethena Midgetddono, Ernest, MD      . sodium citrate-citric acid (ORACIT) solution 30 mL  30 mL Oral Q2H PRN Levie HeritageStinson, Jacob J, DO      . terbutaline (BRETHINE) injection 0.25 mg  0.25 mg Subcutaneous Once PRN Stinson, Jacob J, DO      . terbutaline (BRETHINE) injection 0.25 mg  0.25 mg Subcutaneous Once PRN Degele, Kandra NicolasJulie P, MD       Facility-Administered Medications Ordered in Other Encounters  Medication Dose Route Frequency Provider Last Rate Last Dose  . lidocaine (PF) (XYLOCAINE) 1 % injection    Anesthesia Rockey SituIntra-op Oddono, Ernest, MD   5 mL at 10/31/16 1443    Assessment & Plan:  Pt stable *IUP: category I *Labor: pt pushing for about 7271m right as she became complete. Pt has poor effort with pushing. Recommend laboring down x 1h and having her rest and decrease epidural and then start pushing *severe pre-x: no issues. Pt not needing any meds. Rpt labs PRN *GBS: neg *Analgesia: epidural in place  Cornelia Copaharlie Acire Tang, Jr. MD Attending Center for [facility](Faculty Practice)

## 2016-11-01 NOTE — Progress Notes (Signed)
Labor Progress Note  Allison Moreno was admitted for IOL for pre-E. Came to room to evaluate patient for vacuum assisted as she had been pushing for 2 hours. Attempted vacuum-assist without delivery for 30 minutes. It is believed that patient may have a component of CPD. Discussed caesarean section with patient. Consented to operative delivery.  The risks of cesarean section discussed with the patient included but were not limited to: bleeding which may require transfusion or reoperation; infection which may require antibiotics; injury to bowel, bladder, ureters or other surrounding organs; injury to the fetus; need for additional procedures including hysterectomy in the event of a life-threatening hemorrhage; placental abnormalities wth subsequent pregnancies, incisional problems, thromboembolic phenomenon and other postoperative/anesthesia complications. The patient concurred with the proposed plan, giving informed written consent for the procedure. Anesthesia and OR aware. To OR when ready.   Allison AdaJazma Phelps, DO OB Fellow Faculty Practice, [facility] 11/01/2016, 2:36 PM

## 2016-11-01 NOTE — Transfer of Care (Signed)
Immediate Anesthesia Transfer of Care Note  Patient: Allison Moreno  Procedure(s) Performed: Procedure(s): CESAREAN SECTION (N/A)  Patient Location: PACU  Anesthesia Type:Epidural  Level of Consciousness: awake, alert  and oriented  Airway & Oxygen Therapy: Patient Spontanous Breathing  Post-op Assessment: Report given to RN and Post -op Vital signs reviewed and stable  Post vital signs: Reviewed and stable  Last Vitals:  Vitals:   11/01/16 1400 11/01/16 1430  BP: (!) 139/118 (!) 120/100  Pulse: 78 (!) 103  Resp: 18 16  Temp:      Last Pain:  Vitals:   11/01/16 1300  TempSrc: Axillary  PainSc: 7       Patients Stated Pain Goal: 6 (10/30/16 1929)  Complications: No apparent anesthesia complications

## 2016-11-01 NOTE — Progress Notes (Signed)
Operative Note   SURGERY DATE: 11/01/2016  PRE-OP DIAGNOSIS:  *Pregnancy @ 37/3 *Arrest of descent *Failed vacuum *Pre-eclampsia with severe features   POST-OP DIAGNOSIS: Same.   PROCEDURE: primary low transverse cesarean section via pfannenstiel skin incision with double layer uterine closure  SURGEON: Surgeon(s) and Role:    Moreno Moreno* Moreno Leis, MD - Primary  ASSISTANT: Allison AdaJazma Phelps, DO  ANESTHESIA: epidural  ESTIMATED BLOOD LOSS: 900mL  DRAINS: 50mL UOP via indwelling foley  TOTAL IV FLUIDS: 900mL crystalloid  VTE PROPHYLAXIS: SCDs to bilateral lower extremities  ANTIBIOTICS: One gram of Cefazolin was given., within 1 hour of skin incision and azithromycin 500mg  IV during the case  SPECIMENS: Placenta to path. Umbilical cord gases  COMPLICATIONS: none   FINDINGS: No intra-abdominal adhesions were noted. Grossly normal uterus, tubes and ovaries. clear amniotic fluid, cephalic female infant, direct OP, weight 2355gm, APGARs 2/8, intact placenta. Right hysterotomy extension.  Results for Moreno Moreno, Moreno Moreno (MRN [id number]) as of 11/01/2016 16:01  Ref. Range 11/01/2016 15:14 11/01/2016 15:14  pH cord blood (arterial) Latest Ref Range: 7.210 - 7.380   7.238  pCO2 cord blood (arterial) Latest Ref Range: 42.0 - 56.0 mmHg  57.9 (H)  Bicarbonate Latest Ref Range: 13.0 - 22.0 mmol/L 21.4 23.8 (H)  Ph Cord Blood (Venous) Latest Ref Range: 7.240 - 7.380  7.295   pCO2 Cord Blood (Venous) Latest Ref Range: 42.0 - 56.0  45.5     PROCEDURE IN DETAIL: The patient was taken to the operating room where anesthesia was administered and normal fetal heart tones were confirmed. She was then prepped and draped in the normal fashion in the dorsal supine position with a leftward tilt.  After a time out was performed, a pfannensteil  skin incision was made with the scalpel and carried through to the underlying layer of fascia. The fascia was then incised at the midline and this incision was extended  laterally with the mayo scissors. Attention was turned to the superior aspect of the fascial incision which was grasped with the kocher clamps x 2, tented up and the rectus muscles were dissected off with the bovie. In a similar fashion the inferior aspect of the fascial incision was grasped with the kocher clamps, tented up and the rectus muscles dissected off with the mayo scissors. The rectus muscles were then separated in the midline and the peritoneum was entered bluntly. The bladder blade was inserted and the vesicouterine peritoneum was identified, tented up and entered with the metzenbaum scissors. This incision was extended laterally and the bladder flap was created digitally. The bladder blade was reinserted.  A low transverse hysterotomy was made with the scalpel until the endometrial cavity was breached and the amniotic sac ruptured yielding clear amniotic fluid. This incision was extended bluntly and the infant's head was deeply impacted so decision was made to deliver the fetus breech. The legs were grasped and the body ben out of the hysterotomy to provide neck flexion. The infant was then easily delivered.The cord was clamped x 2 and cut, and the infant was handed to the awaiting pediatricians. Delayed cord clamping was not done.  The placenta was then gradually expressed from the uterus and then the uterus was exteriorized and cleared of all clots and debris. The hysterotomy was repaired with a running suture of 1-0 monocryl. A second imbricating layer of 1-0 monocryl suture was then placed. Several figure-of-eight sutures of #1 chromic were added to achieve excellent hemostasis, especially at the site of the right hysterotomy  extension.   The uterus and adnexa were then returned to the abdomen, and the hysterotomy and all operative sites were reinspected and excellent hemostasis was noted after irrigation and suction of the abdomen with warm saline.  The peritoneum was closed with a running  stitch of 3-0 Vicryl. The fascia was reapproximated with 0 Vicryl in a simple running fashion bilaterally. The subcutaneous layer was then reapproximated with interrupted sutures of 2-0 plain gut, and the skin was then closed with 4-0 monocryl, in a subcuticular fashion. A PICO was then placed.  The patient  tolerated the procedure well. Sponge, lap, needle, and instrument counts were correct x 2. The patient was transferred to the recovery room awake, alert and breathing independently in stable condition.  Moreno Copaharlie Habib Kise, Jr. MD Attending Center for [facility](Faculty Practice)

## 2016-11-01 NOTE — Progress Notes (Addendum)
OB Note  Pushed with patient for approx 6827m with kiwi vac and able to bring to +3 but feels OP and not moving down in pelvis anymore. Category II due to some variables but has good recovery and approx q8859m UCs. D/w pt and recommend proceeding with primary c-section for arrest of descent which she is amenable to.   Cornelia Copaharlie Cortina Vultaggio, Jr MD Attending Center for [facility] (Faculty Practice) 11/01/2016 Time: [id number]

## 2016-11-01 NOTE — Progress Notes (Signed)
Patient ID: Allison Moreno, female   DOB: 07/08/1985, 31 y.o.   MRN: [id number] Labor Progress Note  S: Patient was examined for progress of labor. Patient comfortable with epidural.   O: BP 126/84   Pulse 85   Temp 98 F (36.7 C) (Oral)   Resp 18   Ht 5\' 6"  (1.676 m)   Wt 109.3 kg (241 lb)   LMP 02/13/2016   SpO2 99%   BMI 38.90 kg/m   FHT: 130bpm, mod var, no accels, no decels TOCO: q2-754min, patient looks comfortable during contractions  CVE: Dilation: 7 Effacement (%): 80 Cervical Position: Middle Station: -3 Presentation: Vertex Exam by:: JDaley  A&P: 31 y.o. G1P0 1237w3d admitted for induction of labor due to pre-eclamptic toxemia of pregnancy.  Currently on 20 milli-units of pitocin, s/p AROM @ 2200 On magnesium sulfate Continue current management Anticipate SVD  [name], DO [facility] Resident PGY-1 11/01/2016 3:05 AM

## 2016-11-02 LAB — CBC
HCT: 28.4 % — ABNORMAL LOW (ref 36.0–46.0)
Hemoglobin: 9.6 g/dL — ABNORMAL LOW (ref 12.0–15.0)
MCH: 29.3 pg (ref 26.0–34.0)
MCHC: 33.8 g/dL (ref 30.0–36.0)
MCV: 86.6 fL (ref 78.0–100.0)
PLATELETS: 146 10*3/uL — AB (ref 150–400)
RBC: 3.28 MIL/uL — ABNORMAL LOW (ref 3.87–5.11)
RDW: 13.6 % (ref 11.5–15.5)
WBC: 13.8 10*3/uL — ABNORMAL HIGH (ref 4.0–10.5)

## 2016-11-02 MED ORDER — VALACYCLOVIR HCL 500 MG PO TABS
1000.0000 mg | ORAL_TABLET | Freq: Two times a day (BID) | ORAL | Status: DC
  Administered 2016-11-02 – 2016-11-05 (×7): 1000 mg via ORAL
  Filled 2016-11-02 (×7): qty 2

## 2016-11-02 NOTE — Final Progress Note (Signed)
Attempted to get pt to ambulate. Pt was unsteady and dizzy. Will  attempt ambulation later. MD notified no further orders. Will await results of morning labs.

## 2016-11-02 NOTE — Final Progress Note (Signed)
Notifed Anesthesia CRNA about pt's epiduaral catheter in place. No new orders given.

## 2016-11-02 NOTE — Lactation Note (Addendum)
This note was copied from a baby's chart. Lactation Consultation Note  Patient Name: Allison Moreno Today's Date: 11/02/2016 Reason for consult:  (Baby in central nursery for evaluation.)  Baby 23 hours old. Maternal Gma finishing giving baby a bottle of formula. Discussed supplementation amounts--given guidelines with review. Upon request, assisted mom with positioning and attempting to latch baby in football position to right breast in football position. Baby had just been given a bottle, and mom has no colostrum flowing, so baby would not attempt to latch. Assisted mom with hand expression, but mom very sleepy and not able to follow directions well. 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   Enc MOB and Maternal Gma to put baby to breast with cues and at least every 3 hours. Enc supplementing with EBM/formula according to guidelines, then post-pumping followed by hand expression. Discussed progression of milk coming to volume and supply and demand. Enc mom to call out for assistance with latching/pumping as needed. Mom given colostrum containers and bottles.   Discussed with mom that pumping helps to evert nipples, and mom aware of NS if baby not able to latch when baby cueing to nurse. Mom also has a manual pump to help evert nipple just prior to latching. [facility]   Maternal Data Has patient been taught Hand Expression?: Yes Does the patient have breastfeeding experience prior to this delivery?: No  Feeding Feeding Type: Breast Fed Nipple Type: Regular Length of feed: 0 min  LATCH Score Latch: Too sleepy or reluctant, no latch achieved, no sucking elicited.  Audible Swallowing: None  Type of Nipple: Inverted  Comfort (Breast/Nipple): Soft / non-tender  Hold (Positioning): Assistance needed to correctly position  infant at breast and maintain latch.  LATCH Score: 3  Interventions Interventions: Breast feeding basics reviewed;Assisted with latch;Skin to skin;Hand express;Pre-pump if needed;Breast compression;Adjust position;Support pillows;Position options;Hand pump  Lactation Tools Discussed/Used Tools: Pump Breast pump type: Double-Electric Breast Pump WIC Program: Yes   Consult Status Consult Status: Follow-up Date: 11/03/16 Follow-up type: In-patient    Sherlyn HayJennifer D Dam Ashraf 11/02/2016, 2:50 PM

## 2016-11-02 NOTE — Anesthesia Preprocedure Evaluation (Signed)
Anesthesia Evaluation  Patient identified by MRN, date of birth, ID band Patient awake    Reviewed: Allergy & Precautions, NPO status , Patient's Chart, lab work & pertinent test results  Airway Mallampati: II  TM Distance: >3 FB Neck ROM: Full    Dental  (+) Dental Advisory Given   Pulmonary neg pulmonary ROS,    breath sounds clear to auscultation       Cardiovascular hypertension, negative cardio ROS   Rhythm:Regular Rate:Normal     Neuro/Psych negative neurological ROS     GI/Hepatic Neg liver ROS, GERD  ,Acute appendicitis   Endo/Other  negative endocrine ROS  Renal/GU negative Renal ROS     Musculoskeletal   Abdominal   Peds  Hematology negative hematology ROS (+)   Anesthesia Other Findings   Reproductive/Obstetrics (+) Pregnancy                             Lab Results  Component Value Date   WBC 13.8 (H) 11/02/2016   HGB 9.6 (L) 11/02/2016   HCT 28.4 (L) 11/02/2016   MCV 86.6 11/02/2016   PLT 146 (L) 11/02/2016   Lab Results  Component Value Date   CREATININE 0.92 11/01/2016   BUN 14 11/01/2016   NA 133 (L) 11/01/2016   K 4.0 11/01/2016   CL 102 11/01/2016   CO2 22 11/01/2016    Anesthesia Physical  Anesthesia Plan  ASA: III  Anesthesia Plan: Epidural   Post-op Pain Management:    Induction: Intravenous and Rapid sequence  PONV Risk Score and Plan: 4 or greater and Ondansetron, Dexamethasone, Propofol, Scopolamine patch - Pre-op and Treatment may vary due to age or medical condition  Airway Management Planned:   Additional Equipment:   Intra-op Plan:   Post-operative Plan: Extubation in OR  Informed Consent: I have reviewed the patients History and Physical, chart, labs and discussed the procedure including the risks, benefits and alternatives for the proposed anesthesia with the patient or authorized representative who has indicated his/her  understanding and acceptance.   Dental Advisory Given  Plan Discussed with: CRNA  Anesthesia Plan Comments: (Labs checked- platelets confirmed with RN in room. Fetal heart tracing, per RN, reported to be stable enough for sitting procedure. Discussed epidural, and patient consents to the procedure:  included risk of possible headache,backache, failed block, allergic reaction, and nerve injury. This patient was asked if she had any questions or concerns before the procedure started.)        Anesthesia Quick Evaluation

## 2016-11-02 NOTE — Lactation Note (Addendum)
This note was copied from a baby's chart. Lactation Consultation Note  Patient Name: Allison Moreno Today's Date: 11/02/2016   Baby 20 hours old. Mom reports baby is in the nursery for evaluation. Mom states that she has been pumping. Mom needing to discuss her personal care with bedside RN, so stepped out and will return later.   Maternal Data    Feeding Feeding Type: Formula Nipple Type: Regular  LATCH Score                   Interventions    Lactation Tools Discussed/Used     Consult Status      Allison Moreno 11/02/2016, 11:23 AM

## 2016-11-02 NOTE — Progress Notes (Addendum)
Daily Postpartum Note  Admission Date: 10/30/2016 Current Date: 11/02/2016 6:50 AM  Allison Moreno is a 31 y.o. G1P1001 POD#1 s/p pLTCS for failed vac and arrest of descent (EBL 996m)  Pregnancy complicated by: Patient Active Problem List   Diagnosis Date Noted  . Preeclampsia, third trimester 10/30/2016  . [redacted] weeks gestation of pregnancy 10/30/2016  . small AC 10/25/2016  . Rubella non-immune status, antepartum 05/04/2016  . Supervision of high-risk pregnancy 05/03/2016  . Adopted 05/03/2016  . Adjustment disorder with anxious mood 12/21/2014  . 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 No s/s of pre-x.   Objective:    Current Vital Signs 24h Vital Sign Ranges  T 98.2 F (36.8 C) Temp  Avg: 98.2 F (36.8 C)  Min: 97.5 F (36.4 C)  Max: 99.1 F (37.3 C)  BP 122/89 BP  Min: 116/72  Max: 164/102  HR 77 Pulse  Avg: 83.4  Min: 70  Max: 103  RR 18 Resp  Avg: 20.1  Min: 10  Max: 42  SaO2 97 % Not Delivered SpO2  Avg: 97.5 %  Min: 91 %  Max: 100 %       24 Hour I/O Current Shift I/O  Time Ins Outs 08/02 0701 - 08/03 0700 In: 4431.3 [P.O.:912; I.V.:3253.3] Out: 2925 [[[id number]]08/02 1901 - 08/03 0700 In: 862 [P.O.:862] Out: 800 [Urine:800]   Patient Vitals for the past 24 hrs:  BP Temp Temp src Pulse Resp SpO2  11/02/16 0600 - - - - 18 -  11/02/16 0505 122/89 - - 77 18 97 %  11/02/16 0415 128/86 98.2 F (36.8 C) Oral 80 18 97 %  11/02/16 0400 - - - - 18 -  11/02/16 0300 - - - - 18 -  11/02/16 0200 - - - - 18 -  11/02/16 0100 - - - - 20 -  11/01/16 2329 116/72 98.4 F (36.9 C) Axillary 94 18 99 %  11/01/16 2100 - - - - 20 -  11/01/16 2000 - - - - 18 -  11/01/16 1930 (!) 138/95 98.3 F (36.8 C) Oral 84 18 98 %  11/01/16 1827 - - - 88 (!) 21 98 %   11/01/16 1826 - - - 86 (!) 22 97 %  11/01/16 1825 (!) 146/109 - - 86 16 98 %  11/01/16 1824 - - - 88 17 98 %  11/01/16 1823 - - - 88 (!) 23 97 %  11/01/16 1822 - - - 89 (!) 22 97 %  11/01/16 1821 - - - 84 (!) 21 97 %  11/01/16 1820 (!) 149/110 - - 88 (!) 28 98 %  11/01/16 1819 - - - 89 (!) 42 98 %  11/01/16 1818 - - - 82 17 97 %  11/01/16 1817 - - - 79 18 95 %  11/01/16 1816 - - - 76 19 96 %  11/01/16 1815 (!) 164/102 - - 84 (!) 21 95 %  11/01/16 1814 - - - 77 17 95 %  11/01/16 1813 - - - 74 15 96 %  11/01/16 1812 - - - 80 19 96 %  11/01/16 1811 - - - 77 18 96 %  11/01/16 1800 (!) 160/109 - - 82 (!) 25 99 %  11/01/16 1759 - - - 81 19 98 %  11/01/16 1758 - - - 80 19 98 %  11/01/16 1757 - - - 80 19 99 %  11/01/16 1756 - - - 81 (!) 31 98 %  11/01/16 1752 - - - 83 (!) 22 100 %  11/01/16 1751 - - - 87 (!) 27 98 %  11/01/16 1750 - - - 88 (!) 24 100 %  11/01/16 1747 - - - 83 10 99 %  11/01/16 1746 - - - 86 (!) 23 99 %  11/01/16 1745 (!) 159/110 - - 90 (!) 25 100 %  11/01/16 1744 - - - 88 - 100 %  11/01/16 1743 - - - 85 - 98 %  11/01/16 1742 - - - 85 - 97 %  11/01/16 1741 - - - 83 16 100 %  11/01/16 1740 - - - 84 (!) 38 99 %  11/01/16 1739 - - - 84 20 98 %  11/01/16 1738 - - - 95 (!) 26 98 %  11/01/16 1737 - - - 83 (!) 25 100 %  11/01/16 1736 - - - 81 19 100 %  11/01/16 1734 - - - 86 19 100 %  11/01/16 1733 - - - 77 17 98 %  11/01/16 1732 - - - 87 13 99 %  11/01/16 1731 - - - 80 (!) 26 100 %  11/01/16 1730 (!) 146/101 - - 82 (!) 23 99 %  11/01/16 1729 - - - 81 (!) 23 99 %  11/01/16 1728 - - - 86 (!) 26 99 %  11/01/16 1727 - - - 82 20 99 %  11/01/16 1726 - - - 78 16 96 %  11/01/16 1725 - - - 91 (!) 23 99 %  11/01/16 1724 - - - 89 (!) 22 100 %  11/01/16 1723 - - - 90 (!) 26 99 %  11/01/16 1722 - - - 86 (!) 29 97 %  11/01/16 1721 - - - 86 (!) 21 99 %  11/01/16 1720 - - - 80 (!) 23 100 %  11/01/16 1719 - - - 83 20 99 %  11/01/16 1718 - - - 81 17 99 %  11/01/16 1717 - - - 83  (!) 24 98 %  11/01/16 1716 - - - 82 (!) 21 100 %  11/01/16 1715 (!) 147/101 - - 86 (!) 24 98 %  11/01/16 1714 - - - 80 (!) 24 98 %  11/01/16 1713 - - - 81 17 97 %  11/01/16 1711 - - - 77 (!) 22 97 %  11/01/16 1710 - - - 79 18 98 %  11/01/16 1709 - - - 75 18 96 %  11/01/16 1708 - - - 77 (!) 21 98 %  11/01/16 1706 - - - 81 (!) 21 94 %  11/01/16 1705 - - - 77 19 96 %  11/01/16 1704 - - - 82 (!) 24 98 %  11/01/16 1703 - - - 83 (!) 21 91 %  11/01/16 1702 - - - 85 (!) 25 97 %  11/01/16 1701 - - - 81 15 96 %  11/01/16 1700 (!) 152/107 98.9 F (37.2 C) Oral 82 20 97 %  11/01/16 1659 - - - 86 (!) 21 98 %  11/01/16 1658 - - - 83 20 96 %  11/01/16 1657 - - - 79 (!) 21 96 %  11/01/16 1656 - - - 81 18 93 %  11/01/16 1655 - - - 94 (!) 24 93 %  11/01/16 1654 - - - 74 17 94 %  11/01/16 1653 - - - 76 16 93 %  11/01/16 1652 - - - 76 16 95 %  11/01/16 1651 - - - 76 15 94 %  11/01/16 1650 - - - 77 15 94 %  11/01/16 1649 - - - 74 17 94 %  11/01/16 1645 (!) 148/104 - - 76 16 98 %  11/01/16 1630 (!) 139/105 - - 76 17 99 %  11/01/16 1615 (!) 132/92 99.1 F (37.3 C) Oral 70 14 96 %  11/01/16 1430 (!) 120/100 - - (!) 103 16 -  11/01/16 1400 (!) 139/118 - - 78 18 -  11/01/16 1330 (!) 141/88 - - 80 18 -  11/01/16 1300 135/83 98.1 F (36.7 C) Axillary 87 16 98 %  11/01/16 1230 139/87 - - 82 18 -  11/01/16 1200 (!) 145/96 - - 85 18 -  11/01/16 1130 134/88 - - 83 18 -  11/01/16 1100 (!) 133/91 97.8 F (36.6 C) Oral 95 18 -  11/01/16 1030 (!) 136/97 - - 92 18 -  11/01/16 1000 (!) 140/96 - - 93 16 -  11/01/16 0930 (!) 139/98 - - 87 16 -  11/01/16 0900 (!) 136/92 (!) 97.5 F (36.4 C) Oral 95 16 -  11/01/16 0830 (!) 138/99 - - 91 18 -  11/01/16 0800 (!) 143/94 - - 91 18 -  11/01/16 0730 (!) 142/98 - - 88 18 -  11/01/16 0726 - 97.7 F (36.5 C) Oral - - -  11/01/16 0700 (!) 145/100 - - 90 18 -   UOP >148m/hr  Physical exam: General: Well nourished, well developed female in no acute  distress. Abdomen: obese, c/d/i, PICO and having to use a lot of suction to get the green light. PICO dressing w/o blood on it. Cardiovascular: S1, S2 normal, no murmur, rub or gallop, regular rate and rhythm Respiratory: CTAB Extremities: no clubbing, cyanosis or edema Skin: Warm and dry.  Neuro: 2+ brachial  GU: no gross VB. Foley with 1515mclear UOP in bag  Medications: Current Facility-Administered Medications  Medication Dose Route Frequency Provider Last Rate Last Dose  . acetaminophen (TYLENOL) tablet 1,000 mg  1,000 mg Oral Q6H CaMontez HagemanMD   1,000 mg at 11/02/16 [id number]. acetaminophen (TYLENOL) tablet 650 mg  650 mg Oral Q4H PRN PiAletha HalimMD      . coconut oil  1 application Topical PRN PiAletha HalimMD      . witch hazel-glycerin (TUCKS) pad 1 application  1 application Topical PRN PiAletha HalimMD       And  . dibucaine (NUPERCAINAL) 1 % rectal ointment 1 application  1 application Rectal PRN PiAletha HalimMD      . diphenhydrAMINE (BENADRYL) injection 12.5 mg  12.5 mg Intravenous Q4H PRN CaMontez HagemanMD       Or  . diphenhydrAMINE (BENADRYL) capsule 25 mg  25 mg Oral Q4H PRN CaMontez HagemanMD      . diphenhydrAMINE (BENADRYL) capsule 25 mg  25 mg Oral Q6H PRN PiAletha HalimMD      . ePHEDrine injection 10 mg  10 mg Intravenous PRN OdJaneece RiggersMD      . ePHEDrine injection 10 mg  10 mg Intravenous PRN OdJaneece RiggersMD      . fentaNYL 2.5 mcg/ml w/bupivacaine 0.1% in NS 10035mpidural infusion (WH-ANES)  14 mL/hr Epidural Continuous PRN OddJaneece RiggersD   Stopped  at 11/01/16 1435  . hydrALAZINE (APRESOLINE) injection 10 mg  10 mg Intravenous Once PRN Roma Schanz, CNM      . hydrALAZINE (APRESOLINE) injection 10 mg  10 mg Intravenous Once PRN Aletha Halim, MD      . ibuprofen (ADVIL,MOTRIN) tablet 600 mg  600 mg Oral Q6H PRN Aletha Halim, MD      . ketorolac (TORADOL) 30 MG/ML injection 30 mg  30 mg Intravenous Q6H PRN  Montez Hageman, MD       Or  . ketorolac (TORADOL) 30 MG/ML injection 30 mg  30 mg Intramuscular Q6H PRN Montez Hageman, MD   30 mg at 11/01/16 1719  . labetalol (NORMODYNE,TRANDATE) injection 20-80 mg  20-80 mg Intravenous Q10 min PRN Aletha Halim, MD   20 mg at 11/01/16 1815  . lactated ringers infusion   Intravenous Continuous Degele, Jenne Pane, MD 100 mL/hr at 11/02/16 0241    . magnesium sulfate 40 grams in LR 500 mL OB infusion  2 g/hr Intravenous Continuous Aletha Halim, MD 25 mL/hr at 11/01/16 2317 2 g/hr at 11/01/16 2317  . measles, mumps and rubella vaccine (MMR) injection 0.5 mL  0.5 mL Subcutaneous Once Aletha Halim, MD      . menthol-cetylpyridinium (CEPACOL) lozenge 3 mg  1 lozenge Oral Q2H PRN Aletha Halim, MD      . nalbuphine (NUBAIN) injection 5 mg  5 mg Intravenous Q4H PRN Montez Hageman, MD       Or  . nalbuphine (NUBAIN) injection 5 mg  5 mg Subcutaneous Q4H PRN Montez Hageman, MD      . nalbuphine (NUBAIN) injection 5 mg  5 mg Intravenous Once PRN Montez Hageman, MD       Or  . nalbuphine (NUBAIN) injection 5 mg  5 mg Subcutaneous Once PRN Montez Hageman, MD      . naloxone [facility]) 2 mg in dextrose 5 % 250 mL infusion  1-4 mcg/kg/hr Intravenous Continuous PRN Montez Hageman, MD      . naloxone [facility]) injection 0.4 mg  0.4 mg Intravenous PRN Montez Hageman, MD       And  . sodium chloride flush (NS) 0.9 % injection 3 mL  3 mL Intravenous PRN Montez Hageman, MD      . ondansetron [facility]) injection 4 mg  4 mg Intravenous Q6H PRN Truett Mainland, DO   4 mg at 11/01/16 1319  . ondansetron (ZOFRAN) injection 4 mg  4 mg Intravenous Q8H PRN Montez Hageman, MD      . oxyCODONE (Oxy IR/ROXICODONE) immediate release tablet 10 mg  10 mg Oral Q4H PRN Aletha Halim, MD      . oxyCODONE (Oxy IR/ROXICODONE) immediate release tablet 5 mg  5 mg Oral Q4H PRN Aletha Halim, MD   5 mg at 11/02/16 0240  . oxytocin (PITOCIN) IV infusion 40 units in LR 1000 mL -  Premix  2.5 Units/hr Intravenous Continuous Aletha Halim, MD      . PHENYLephrine 40 mcg/ml in normal saline Adult IV Push Syringe  80 mcg Intravenous PRN Janeece Riggers, MD      . PHENYLephrine 40 mcg/ml in normal saline Adult IV Push Syringe  80 mcg Intravenous PRN Janeece Riggers, MD      . polyethylene glycol (MIRALAX / GLYCOLAX) packet 17 g  17 g Oral Daily Aletha Halim, MD      . prenatal multivitamin tablet 1 tablet  1 tablet Oral Q1200 Aletha Halim, MD      .  scopolamine (TRANSDERM-SCOP) 1 MG/3DAYS 1.5 mg  1 patch Transdermal Once Montez Hageman, MD      . simethicone [facility]) chewable tablet 80 mg  80 mg Oral TID Ria Clock, MD   80 mg at 11/01/16 2325  . Tdap (BOOSTRIX) injection 0.5 mL  0.5 mL Intramuscular Once Aletha Halim, MD        Labs:   Recent Labs Lab 10/31/16 1427 10/31/16 2023 11/01/16 2303  WBC 11.3* 11.4* 19.0*  HGB 11.8* 11.4* 10.6*  HCT 35.6* 34.5* 31.7*  PLT 145* 155 178     Recent Labs Lab 10/31/16 0832 10/31/16 2023 11/01/16 2303  NA 135 134* 133*  K 4.0 3.9 4.0  CL 105 104 102  CO2 22 21* 22  BUN _0 CREATININE 0.71 0.81 0.92  CALCIUM 8.8* 7.9* 6.8*  PROT 5.9* 6.4* 5.6*  BILITOT 0.4 0.5 0.4  ALKPHOS 90 91 78  ALT 10* 11* 10*  AST _1 GLUCOSE 88 103* 140*    Radiology: none  Assessment & Plan:  Pt doing well *Postpartum: routine care. Will get some tape and reinforce the PICO  *Severe pre-eclampsia: f/u AM CBC. Normal BPs on no meds. Mg until 1500 today *PPx: SCDs *FEN/GI: regular diet, MIVF *Dispo: likely POD#3-4. Will need on week BP check. Request sent to [place]. MD Attending Center for [facility])

## 2016-11-02 NOTE — Plan of Care (Signed)
Problem: Education: Goal: Knowledge of condition will improve Outcome: Progressing Discussed the importance of skin to skin, breast care and nutrition while breastfeeding.  Problem: Coping: Goal: Ability to cope will improve Outcome: Not Progressing Pt emotional during this shift having episodes of tearfulness, anxiety and hyperventillation, states due to lack of rest over the course of the past 4 days.   Problem: Life Cycle: Goal: Risk for postpartum hemorrhage will decrease Outcome: Progressing Fundus U/1 with a small amount of Lochia through the day.   Problem: Nutritional: Goal: Dietary intake will improve Outcome: Progressing Pt ate breakfast and lunch with no nausea  Goal: Mothers verbalization of comfort with breastfeeding process will improve Outcome: Progressing Mother expressed small amounts of colostrum through the day that was sent to the nursery, Mother latched infant once and worked with lactation on breastfeeding expresses hopefull attitude for breastfeeding success.   Problem: Role Relationship: Goal: Ability to demonstrate positive interaction with newborn will improve Outcome: Progressing Pt only had infant in room for a couple of hours this shift before asking to have infant sent to the nursery so that she could sleep.   Problem: Respiratory: Goal: Ability to maintain adequate ventilation will improve Outcome: Progressing Pt maintained appropriate respiration rates and oxygenation status through this shift. Pt performed incentive spirometry to 1500 several times today.   Problem: Skin Integrity: Goal: Demonstration of wound healing without infection will improve Outcome: Progressing Dressing changed with Drs Order, Wound clean dry intact.

## 2016-11-03 MED ORDER — FUROSEMIDE 20 MG PO TABS
20.0000 mg | ORAL_TABLET | Freq: Every day | ORAL | Status: AC
Start: 2016-11-03 — End: 2016-11-04
  Administered 2016-11-03 – 2016-11-04 (×2): 20 mg via ORAL
  Filled 2016-11-03 (×2): qty 1

## 2016-11-03 MED ORDER — AMLODIPINE BESYLATE 10 MG PO TABS
10.0000 mg | ORAL_TABLET | Freq: Every day | ORAL | Status: DC
  Administered 2016-11-03 – 2016-11-05 (×3): 10 mg via ORAL
  Filled 2016-11-03 (×3): qty 1

## 2016-11-03 MED ORDER — MEASLES, MUMPS & RUBELLA VAC ~~LOC~~ INJ
0.5000 mL | INJECTION | Freq: Once | SUBCUTANEOUS | Status: AC
  Administered 2016-11-05: 0.5 mL via SUBCUTANEOUS
  Filled 2016-11-03: qty 0.5

## 2016-11-03 NOTE — Progress Notes (Signed)
Daily Postpartum Note  Admission Date: 10/30/2016 Current Date: 11/03/2016 7:48 AM  Allison Moreno is a 31 y.o. G1P1001 POD#2 s/p pLTCS for failed vac and arrest of descent (EBL 917m)  Pregnancy complicated by: Patient Active Problem List   Diagnosis Date Noted  . Preeclampsia, third trimester 10/30/2016  . [redacted] weeks gestation of pregnancy 10/30/2016  . small AC 10/25/2016  . Rubella non-immune status, antepartum 05/04/2016  . Supervision of high-risk pregnancy 05/03/2016  . Adopted 05/03/2016  . Adjustment disorder with anxious mood 12/21/2014  . 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  No severe symptoms. Ambulating. No flatus yet.  Objective:    Current Vital Signs 24h Vital Sign Ranges  T 98.7 F (37.1 C) Temp  Avg: 98.6 F (37 C)  Min: 97.5 F (36.4 C)  Max: 99.8 F (37.7 C)  BP (!) 154/94 BP  Min: 114/73  Max: 154/94  HR 89 Pulse  Avg: 85.7  Min: 76  Max: 95  RR 20 Resp  Avg: 18.7  Min: 18  Max: 20  SaO2 97 % Not Delivered SpO2  Avg: 95.9 %  Min: 91 %  Max: 99 %       24 Hour I/O Current Shift I/O  Time Ins Outs 08/03 0701 - 08/04 0700 In: 5253.9 [P.O.:2881; I.V.:2372.9] Out: 3050 [Urine:3050] No intake/output data recorded.   Patient Vitals for the past 24 hrs:  BP Temp Temp src Pulse Resp SpO2  11/03/16 0419 (!) 154/94 98.7 F (37.1 C) Oral 89 20 97 %  11/02/16 2343 (!) 144/90 99.8 F (37.7 C) Oral 86 20 97 %  11/02/16 1940 (!) 148/97 97.8 F (36.6 C) Oral 83 20 99 %  11/02/16 1757 126/76 - - 88 18 95 %  11/02/16 1642 114/73 - - 76 18 93 %  11/02/16 1533 138/79 - - 82 18 91 %  11/02/16 1433 (!) 129/92 99.3 F (37.4 C) Oral 85 18 96 %  11/02/16 1219 124/84 (!) 97.5 F (36.4 C) Oral 95 18 99 %  11/02/16 0754 (!) 123/92 98.3 F (36.8 C) Oral 87 18 96 %   UOP >1057mhr  Physical  exam: General: Well nourished, well developed female in no acute distress. Abdomen: obese, c/d/i, PICO dressing w/o blood on it. Cardiovascular: S1, S2 normal, no murmur, rub or gallop, regular rate and rhythm Respiratory: CTAB Extremities: 2+ edema bilaterally Skin: Warm and dry.  Neuro: 2+ brachial    Medications: Current Facility-Administered Medications  Medication Dose Route Frequency Provider Last Rate Last Dose  . acetaminophen (TYLENOL) tablet 650 mg  650 mg Oral Q4H PRN PiAletha HalimMD   650 mg at 11/03/16 0607  . amLODipine (NORVASC) tablet 10 mg  10 mg Oral Daily Xandrea Clarey A, MD      . coconut oil  1 application Topical PRN PiAletha HalimMD      . witch hazel-glycerin (TUCKS) pad 1 application  1 application Topical PRN PiAletha HalimMD       And  . dibucaine (NUPERCAINAL) 1 % rectal ointment 1 application  1 application Rectal PRN PiAletha HalimMD      . diphenhydrAMINE (BENADRYL) injection 12.5 mg  12.5 mg Intravenous Q4H PRN CaMontez HagemanMD   12.5 mg at 11/02/16 0753   Or  . diphenhydrAMINE (BENADRYL) capsule 25 mg  25 mg Oral Q4H PRN  Montez Hageman, MD      . diphenhydrAMINE (BENADRYL) capsule 25 mg  25 mg Oral Q6H PRN Aletha Halim, MD      . ePHEDrine injection 10 mg  10 mg Intravenous PRN Janeece Riggers, MD      . ePHEDrine injection 10 mg  10 mg Intravenous PRN Janeece Riggers, MD      . fentaNYL 2.5 mcg/ml w/bupivacaine 0.1% in NS 138m epidural infusion (WH-ANES)  14 mL/hr Epidural Continuous PRN OJaneece Riggers MD   Stopped at 11/01/16 1435  . furosemide (LASIX) tablet 20 mg  20 mg Oral Daily Domnic Vantol A, MD      . hydrALAZINE (APRESOLINE) injection 10 mg  10 mg Intravenous Once PRN BRoma Schanz CNM      . hydrALAZINE (APRESOLINE) injection 10 mg  10 mg Intravenous Once PRN PAletha Halim MD      . ibuprofen (ADVIL,MOTRIN) tablet 600 mg  600 mg Oral Q6H PRN PAletha Halim MD   600 mg at 11/02/16 1451  . labetalol  (NORMODYNE,TRANDATE) injection 20-80 mg  20-80 mg Intravenous Q10 min PRN PAletha Halim MD   20 mg at 11/01/16 1815  . measles, mumps and rubella vaccine (MMR) injection 0.5 mL  0.5 mL Subcutaneous Once PAletha Halim MD      . menthol-cetylpyridinium (CEPACOL) lozenge 3 mg  1 lozenge Oral Q2H PRN PAletha Halim MD      . nalbuphine (NUBAIN) injection 5 mg  5 mg Intravenous Q4H PRN CMontez Hageman MD       Or  . nalbuphine (NUBAIN) injection 5 mg  5 mg Subcutaneous Q4H PRN CMontez Hageman MD      . naloxone ([facility] 2 mg in dextrose 5 % 250 mL infusion  1-4 mcg/kg/hr Intravenous Continuous PRN CMontez Hageman MD      . naloxone ([facility] injection 0.4 mg  0.4 mg Intravenous PRN CMontez Hageman MD       And  . sodium chloride flush (NS) 0.9 % injection 3 mL  3 mL Intravenous PRN CMontez Hageman MD      . ondansetron ([facility] injection 4 mg  4 mg Intravenous Q6H PRN STruett Mainland DO   4 mg at 11/01/16 1319  . ondansetron (ZOFRAN) injection 4 mg  4 mg Intravenous Q8H PRN CMontez Hageman MD      . oxyCODONE (Oxy IR/ROXICODONE) immediate release tablet 10 mg  10 mg Oral Q4H PRN PAletha Halim MD   10 mg at 11/02/16 1451  . oxyCODONE (Oxy IR/ROXICODONE) immediate release tablet 5 mg  5 mg Oral Q4H PRN PAletha Halim MD   5 mg at 11/02/16 0240  . PHENYLephrine 40 mcg/ml in normal saline Adult IV Push Syringe  80 mcg Intravenous PRN OJaneece Riggers MD      . PHENYLephrine 40 mcg/ml in normal saline Adult IV Push Syringe  80 mcg Intravenous PRN OJaneece Riggers MD      . polyethylene glycol (MIRALAX / GLYCOLAX) packet 17 g  17 g Oral Daily PAletha Halim MD   17 g at 11/02/16 1256  . prenatal multivitamin tablet 1 tablet  1 tablet Oral Q1200 PAletha Halim MD   1 tablet at 11/02/16 1256  . scopolamine (TRANSDERM-SCOP) 1 MG/3DAYS 1.5 mg  1 patch Transdermal Once CMontez Hageman MD      . simethicone ([facility] chewable tablet 80 mg  80 mg Oral TID PRia Clock MD   80  mg at 11/02/16 1256  . Tdap (BOOSTRIX) injection 0.5 mL  0.5 mL Intramuscular Once Aletha Halim, MD      . valACYclovir (VALTREX) tablet 1,000 mg  1,000 mg Oral BID Aletha Halim, MD   1,000 mg at 11/02/16 2156    Labs:   Recent Labs Lab 10/31/16 2023 11/01/16 2303 11/02/16 0549  WBC 11.4* 19.0* 13.8*  HGB 11.4* 10.6* 9.6*  HCT 34.5* 31.7* 28.4*  PLT 155 178 146*     Recent Labs Lab 10/31/16 0832 10/31/16 2023 11/01/16 2303  NA 135 134* 133*  K 4.0 3.9 4.0  CL 105 104 102  CO2 22 21* 22  BUN 17 13 14   CREATININE 0.71 0.81 0.92  CALCIUM 8.8* 7.9* 6.8*  PROT 5.9* 6.4* 5.6*  BILITOT 0.4 0.5 0.4  ALKPHOS 90 91 78  ALT 10* 11* 10*  AST 17 22 28   GLUCOSE 88 103* 140*    Radiology: none  Assessment & Plan:  Pt doing well *Postpartum: routine care.  *Severe pre-eclampsia: s/p magnesium sulfate. Norvasc started for BP control *PPx: SCDs *FEN/GI: regular diet, MIVF *Dispo: likely POD#3-4. Will need one week BP check. Request sent to [facility]   Verita Schneiders, MD Attending Center for [facility])

## 2016-11-03 NOTE — Lactation Note (Signed)
This note was copied from a baby's chart. Lactation Consultation Note: Infant is now 1144 hours old. Infant has had several feeding attemptsMothers breast are filling and her nipples are dimpled. Assist mother with hand pumping piror to latching. Several attempts to latch infant but no sustained latch. Mother was fit with a #24 nipple shield. Infant sustained latch for 15-20 mins. Mother was taught to flange infants lips for wider gape. Infant was given ebm with a curved tip syringe thought the nipple shield. Instruct mother in finger feeding infant. Mother also taught to do suck training .  Mother to continue to post pump for 15-20 mins. every 2-3 hours.  Discussed LPI guidelines and advised mother to cue base feed and feed infant at least 8-12 times in 24 hours. Mother doing frequent skin to skin. Mother receptive to all teaching.    Patient Name: Allison Venda RodesCrystal Congrove Today's Date: 11/03/2016 Reason for consult: Follow-up assessment   Maternal Data    Feeding Feeding Type: Breast Fed Length of feed: 20 min  LATCH Score Latch: Repeated attempts needed to sustain latch, nipple held in mouth throughout feeding, stimulation needed to elicit sucking reflex.  Audible Swallowing: A few with stimulation  Type of Nipple: Flat  Comfort (Breast/Nipple): Filling, red/small blisters or bruises, mild/mod discomfort  Hold (Positioning): Assistance needed to correctly position infant at breast and maintain latch.  LATCH Score: 5  Interventions Interventions: Assisted with latch;Hand express;Breast compression;Adjust position;Expressed milk;Shells;Hand pump;DEBP  Lactation Tools Discussed/Used Tools: Nipple Shields Nipple shield size: 24 Shell Type: Inverted Breast pump type: Double-Electric Breast Pump   Consult Status Consult Status: Follow-up Date: 11/04/16 Follow-up type: In-patient    Stevan BornKendrick, Sallyanne Birkhead [facility] 11/03/2016, 12:59 PM

## 2016-11-03 NOTE — Progress Notes (Signed)
CSW received consult for hx of Depression.  CSW met with MOB to offer support and further assess.    Upon this writers arrival, CSW explained role and reasoning for visit. At this time, MOB was warm and welcoming. MOB informed this writer her depression was very situational after she experienced the death of two close friends. MOB notes one of her friends was blown up in [place] while serving for our country and the other passed away due to medical malpractice. MOB notes she is since fine and has not experienced depression since.  CSW commended MOB for being able to get through that tough time positively and congratulated her on becoming a new mom. MOB was thankful.   CSW provided brief education regarding the baby blues period vs. perinatal mood disorders and discussed treatment.   CSW identifies no further need for intervention and no barriers to discharge at this time.  Tilden Broz, MSW, LCSW-A Clinical Social Worker  [facility]  Office: [phone]   

## 2016-11-04 MED ORDER — HYDROCHLOROTHIAZIDE 25 MG PO TABS
25.0000 mg | ORAL_TABLET | Freq: Every day | ORAL | Status: DC
  Administered 2016-11-04 – 2016-11-05 (×2): 25 mg via ORAL
  Filled 2016-11-04 (×2): qty 1

## 2016-11-04 NOTE — Progress Notes (Signed)
Daily Postpartum Note  Admission Date: 10/30/2016 Current Date: 11/04/2016 9:41 AM  Allison Moreno is a 31 y.o. G1P1001 POD#3 s/p pLTCS for failed vac and arrest of descent (EBL 969m)  Pregnancy complicated by: Patient Active Problem List   Diagnosis Date Noted  . Preeclampsia, third trimester 10/30/2016  . [redacted] weeks gestation of pregnancy 10/30/2016  . small AC 10/25/2016  . Rubella non-immune status, antepartum 05/04/2016  . Supervision of high-risk pregnancy 05/03/2016  . Adopted 05/03/2016  . Adjustment disorder with anxious mood 12/21/2014  . 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  No severe symptoms. Ambulating. No flatus yet.  Objective:    Current Vital Signs 24h Vital Sign Ranges  T 98 F (36.7 C) Temp  Avg: 98.4 F (36.9 C)  Min: 98 F (36.7 C)  Max: 98.9 F (37.2 C)  BP (!) 126/96 BP  Min: 126/96  Max: 151/75  HR 88 Pulse  Avg: 93  Min: 62  Max: 107  RR 20 Resp  Avg: 19.2  Min: 18  Max: 20  SaO2 98 % Not Delivered SpO2  Avg: 96.7 %  Min: 92 %  Max: 100 %       24 Hour I/O Current Shift I/O  Time Ins Outs No intake/output data recorded. No intake/output data recorded.   Patient Vitals for the past 24 hrs:  BP Temp Temp src Pulse Resp SpO2  11/04/16 0800 (!) 126/96 98 F (36.7 C) Oral 88 20 98 %  11/04/16 0421 (!) 151/75 98.7 F (37.1 C) Oral 62 19 100 %  11/03/16 2323 (!) 137/54 98.7 F (37.1 C) Oral (!) 102 18 99 %  11/03/16 1932 (!) 142/98 98.9 F (37.2 C) Oral (!) 107 18 92 %  11/03/16 1527 (!) 129/92 98.2 F (36.8 C) Oral (!) 105 20 96 %  11/03/16 1200 (!) 146/95 98.1 F (36.7 C) Oral 94 20 95 %   UOP >1088mhr  Physical exam: General: Well nourished, well developed female in no acute distress. Abdomen: obese, c/d/i, PICO dressing w/o blood on it. Cardiovascular: S1, S2  normal, no murmur, rub or gallop, regular rate and rhythm Respiratory: CTAB Extremities: 2+ edema bilaterally Skin: Warm and dry.    Medications: Current Facility-Administered Medications  Medication Dose Route Frequency Provider Last Rate Last Dose  . acetaminophen (TYLENOL) tablet 650 mg  650 mg Oral Q4H PRN PiAletha HalimMD   650 mg at 11/03/16 2132  . amLODipine (NORVASC) tablet 10 mg  10 mg Oral Daily Anessa Charley A, MD   10 mg at 11/03/16 1013  . coconut oil  1 application Topical PRN PiAletha HalimMD      . witch hazel-glycerin (TUCKS) pad 1 application  1 application Topical PRN PiAletha HalimMD       And  . dibucaine (NUPERCAINAL) 1 % rectal ointment 1 application  1 application Rectal PRN PiAletha HalimMD      . diphenhydrAMINE (BENADRYL) injection 12.5 mg  12.5 mg Intravenous Q4H PRN CaMontez HagemanMD   12.5 mg at 11/02/16 0753   Or  . diphenhydrAMINE (BENADRYL) capsule 25 mg  25 mg Oral Q4H PRN CaMontez HagemanMD      . diphenhydrAMINE (BENADRYL) capsule 25 mg  25 mg Oral Q6H PRN PiAletha HalimMD      . ePHEDrine injection 10 mg  10 mg  Intravenous PRN Janeece Riggers, MD      . ePHEDrine injection 10 mg  10 mg Intravenous PRN Janeece Riggers, MD      . fentaNYL 2.5 mcg/ml w/bupivacaine 0.1% in NS 149m epidural infusion (WH-ANES)  14 mL/hr Epidural Continuous PRN OJaneece Riggers MD   Stopped at 11/01/16 1435  . furosemide (LASIX) tablet 20 mg  20 mg Oral Daily Lois Slagel A, MD   20 mg at 11/03/16 1012  . hydrALAZINE (APRESOLINE) injection 10 mg  10 mg Intravenous Once PRN BRoma Schanz CNM      . hydrALAZINE (APRESOLINE) injection 10 mg  10 mg Intravenous Once PRN PAletha Halim MD      . hydrochlorothiazide (HYDRODIURIL) tablet 25 mg  25 mg Oral Daily Jakai Onofre A, MD      . ibuprofen (ADVIL,MOTRIN) tablet 600 mg  600 mg Oral Q6H PRN PAletha Halim MD   600 mg at 11/04/16 0229  . labetalol (NORMODYNE,TRANDATE) injection 20-80 mg   20-80 mg Intravenous Q10 min PRN PAletha Halim MD   20 mg at 11/01/16 1815  . measles, mumps and rubella vaccine (MMR) injection 0.5 mL  0.5 mL Subcutaneous Once EFlorian Buff MD      . menthol-cetylpyridinium (CEPACOL) lozenge 3 mg  1 lozenge Oral Q2H PRN PAletha Halim MD      . nalbuphine (NUBAIN) injection 5 mg  5 mg Intravenous Q4H PRN CMontez Hageman MD       Or  . nalbuphine (NUBAIN) injection 5 mg  5 mg Subcutaneous Q4H PRN CMontez Hageman MD      . naloxone ([facility] 2 mg in dextrose 5 % 250 mL infusion  1-4 mcg/kg/hr Intravenous Continuous PRN CMontez Hageman MD      . naloxone ([facility] injection 0.4 mg  0.4 mg Intravenous PRN CMontez Hageman MD       And  . sodium chloride flush (NS) 0.9 % injection 3 mL  3 mL Intravenous PRN CMontez Hageman MD      . ondansetron ([facility] injection 4 mg  4 mg Intravenous Q6H PRN STruett Mainland DO   4 mg at 11/01/16 1319  . ondansetron (ZOFRAN) injection 4 mg  4 mg Intravenous Q8H PRN CMontez Hageman MD      . oxyCODONE (Oxy IR/ROXICODONE) immediate release tablet 10 mg  10 mg Oral Q4H PRN PAletha Halim MD   10 mg at 11/02/16 1451  . oxyCODONE (Oxy IR/ROXICODONE) immediate release tablet 5 mg  5 mg Oral Q4H PRN PAletha Halim MD   5 mg at 11/02/16 0240  . PHENYLephrine 40 mcg/ml in normal saline Adult IV Push Syringe  80 mcg Intravenous PRN OJaneece Riggers MD      . PHENYLephrine 40 mcg/ml in normal saline Adult IV Push Syringe  80 mcg Intravenous PRN OJaneece Riggers MD      . polyethylene glycol (MIRALAX / GLYCOLAX) packet 17 g  17 g Oral Daily PAletha Halim MD   17 g at 11/03/16 1013  . prenatal multivitamin tablet 1 tablet  1 tablet Oral Q1200 PAletha Halim MD   1 tablet at 11/03/16 1255  . scopolamine (TRANSDERM-SCOP) 1 MG/3DAYS 1.5 mg  1 patch Transdermal Once CMontez Hageman MD      . simethicone ([facility] chewable tablet 80 mg  80 mg Oral TID PRia Clock MD   80 mg at 11/03/16 2258  . Tdap (BOOSTRIX)  injection 0.5 mL  0.5 mL Intramuscular Once PAletha Halim MD      .  valACYclovir (VALTREX) tablet 1,000 mg  1,000 mg Oral BID Aletha Halim, MD   1,000 mg at 11/03/16 2132    Labs:   Recent Labs Lab 10/31/16 2023 11/01/16 2303 11/02/16 0549  WBC 11.4* 19.0* 13.8*  HGB 11.4* 10.6* 9.6*  HCT 34.5* 31.7* 28.4*  PLT 155 178 146*     Recent Labs Lab 10/31/16 0832 10/31/16 2023 11/01/16 2303  NA 135 134* 133*  K 4.0 3.9 4.0  CL 105 104 102  CO2 22 21* 22  BUN '17 13 14  ' CREATININE 0.71 0.81 0.92  CALCIUM 8.8* 7.9* 6.8*  PROT 5.9* 6.4* 5.6*  BILITOT 0.4 0.5 0.4  ALKPHOS 90 91 78  ALT 10* 11* 10*  AST '17 22 28  ' GLUCOSE 88 103* 140*    Radiology: none  Assessment & Plan:  Pt doing well *Postpartum: routine care.  *Severe pre-eclampsia: s/p magnesium sulfate. Norvasc started for BP control; HCTZ added today for improved control. *PPx: SCDs *FEN/GI: regular diet *Dispo: likely POD#4. Will need one week BP check. [facility]   Verita Schneiders, MD Attending Center for [facility])

## 2016-11-05 ENCOUNTER — Telehealth: Payer: Self-pay | Admitting: Radiology

## 2016-11-05 DIAGNOSIS — Z3A37 37 weeks gestation of pregnancy: Secondary | ICD-10-CM

## 2016-11-05 MED ORDER — HYDROCHLOROTHIAZIDE 25 MG PO TABS: 25.0000 mg | ORAL_TABLET | Freq: Every day | ORAL | 2 refills | Status: DC

## 2016-11-05 MED ORDER — IBUPROFEN 600 MG PO TABS: 600.0000 mg | ORAL_TABLET | Freq: Four times a day (QID) | ORAL | 0 refills | Status: DC | PRN

## 2016-11-05 MED ORDER — OXYCODONE HCL 5 MG PO TABS: 5.0000 mg | ORAL_TABLET | ORAL | 0 refills | Status: DC | PRN

## 2016-11-05 MED ORDER — AMLODIPINE BESYLATE 10 MG PO TABS: 10.0000 mg | ORAL_TABLET | Freq: Every day | ORAL | 2 refills | Status: DC

## 2016-11-05 NOTE — Discharge Summary (Signed)
Obstetric Discharge Summary Reason for Admission: PEC Prenatal Procedures: ultrasound Intrapartum Procedures: cesarean: low cervical, transverse Postpartum Procedures: Magnesium therapy Complications-Operative and Postpartum: none Hemoglobin  Date Value Ref Range Status  11/02/2016 9.6 (L) 12.0 - 15.0 g/dL Final  78/29/562106/09/2016 30.812.8 11.1 - 15.9 g/dL Final   HCT  Date Value Ref Range Status  11/02/2016 28.4 (L) 36.0 - 46.0 % Final   Hematocrit  Date Value Ref Range Status  09/06/2016 39.1 34.0 - 46.6 % Final    Physical Exam:  General: no distress Lochia: appropriate Uterine Fundus: firm Incision: healing well DVT Evaluation: No evidence of DVT seen on physical exam.  Discharge Diagnoses: Term Pregnancy-delivered  Discharge Information: Date: 11/05/2016 Activity: pelvic rest Diet: routine Medications: PNV, Ibuprofen, Percocet and Norvasc and HCTZ Condition: stable Instructions: refer to practice specific booklet Discharge to: home Follow-up Information    [facility]. Schedule an appointment as soon as possible for a visit in 4 week(s).   Specialty:  Obstetrics and Gynecology Why:  4 weeks for postpartum visit Appt this week for BP check Contact information: [address] [place] [phone]          Newborn Data: Live born female  Birth Weight: 5 lb 3.1 oz (2355 g) APGAR: 2, 8  Home with mother.  Allison StaggersMichael L Florestine Moreno 11/05/2016, 8:58 AM

## 2016-11-05 NOTE — Telephone Encounter (Signed)
Called patient, left voicemail on cell phone to contact [facility] to schedule appointment for BP check and postpartum appointment.

## 2016-11-05 NOTE — Anesthesia Postprocedure Evaluation (Signed)
Anesthesia Post Note  Patient: Allison Moreno  Procedure(s) Performed: Procedure(s) (LRB): CESAREAN SECTION (N/A)     Patient location during evaluation: PACU Anesthesia Type: Epidural Level of consciousness: awake and alert Pain management: pain level controlled Vital Signs Assessment: post-procedure vital signs reviewed and stable Respiratory status: spontaneous breathing and respiratory function stable Cardiovascular status: blood pressure returned to baseline and stable Postop Assessment: no headache, no backache and epidural receding Anesthetic complications: no    Last Vitals:  Vitals:   11/04/16 1937 11/05/16 0009  BP:  (!) 149/95  Pulse: 100 99  Resp: 20 18  Temp: 36.9 C 36.6 C    Last Pain:  Vitals:   11/05/16 0400  TempSrc:   PainSc: Asleep                 Phillips Groutarignan, Deno Sida

## 2016-11-05 NOTE — Progress Notes (Signed)
Discharge teaching complete with pt. Pt understood all information and did not have any questions. Pt ambulated out of the hospital and discharged home to family.  

## 2016-11-05 NOTE — Lactation Note (Signed)
This note was copied from a baby's chart. Lactation Consultation Note  Patient Name: Allison Moreno Today's Date: 11/05/2016 Reason for consult: Follow-up assessment;Infant < 6lbs  Baby 91 hours old. Mom reports that she is getting about 2 ounces of EBM when she pumps now. Offered to assist mom with latching baby at the breast, but mom reports she is packing up for D/C and will give a bottle of EBM at this feeding. Enc mom to continue putting baby to breast with cues and at least every 3 hours. Enc mom to continue supplementing with EBM after baby at breast, and then to post-pump followed by hand expression. Discussed the need to keep pumping as long as she is using NS and the need to supplement until baby closer to 6 pounds and to due date. Discussed methods of moving away from use of NS, and mom has number to call to make an outpatient appointment. Mom aware of BFSG and LC phone line assistance after D/C.   Mom given "Mother and Baby Care" booklet, and referred to EBM storage guidelines. Mom given a [facility] loan[facility] pump as well. Enc mom to call for assistance as needed.   Maternal Data    Feeding Feeding Type: Breast Milk Nipple Type: Slow - flow  LATCH Score                   Interventions Interventions: DEBP [facility](WIC loaner. )  Lactation Tools Discussed/Used Tools: Nipple Dorris CarnesShields;Pump Nipple shield size: 20   Consult Status Consult Status: PRN    Sherlyn HayJennifer D Zhane Bluitt 11/05/2016, 10:23 AM

## 2016-11-06 ENCOUNTER — Telehealth: Payer: Self-pay

## 2016-11-06 NOTE — Telephone Encounter (Signed)
Doly from [facility] called about patient's prescription for Hydrochlorothiazide. Gloris HamDoly stated that since the patient is allergic to sulfa antibiotics is it ok to for patient to take the medication. Per Dr. Adrian BlackwaterStinson the patient should be able to take Hydrochlorothiazide. Pharmacy verbalized understanding.

## 2016-11-06 NOTE — Op Note (Signed)
Operative Note   SURGERY DATE: 11/01/2016  PRE-OP DIAGNOSIS:  *Pregnancy @ 37/3 *Arrest of descent *Failed vacuum *Pre-eclampsia with severe features   POST-OP DIAGNOSIS: Same.   PROCEDURE: primary low transverse cesarean section via pfannenstiel skin incision with double layer uterine closure  SURGEON: Surgeon(s) and Role:    Fairborn Bing, MD - Primary  ASSISTANT: Caryl Ada, DO  ANESTHESIA: epidural  ESTIMATED BLOOD LOSS:  DRAINS: 50mL UOP via indwelling foley  TOTAL IV FLUIDS: crystalloid  VTE PROPHYLAXIS: SCDs to bilateral lower extremities  ANTIBIOTICS: One gram of Cefazolin was given., within 1 hour of skin incision and azithromycin 500mg  IV during the case  SPECIMENS: Placenta to path. Umbilical cord gases  COMPLICATIONS: none   FINDINGS: No intra-abdominal adhesions were noted. Grossly normal uterus, tubes and ovaries. clear amniotic fluid, cephalic female infant, direct OP, weight 2355gm, APGARs 2/8, intact placenta. Right hysterotomy extension.  Results for GLENA, PHARRIS (MRN [id number]) as of 11/01/2016 16:01  Ref. Range 11/01/2016 15:14 11/01/2016 15:14  pH cord blood (arterial) Latest Ref Range: 7.210 - 7.380   7.238  pCO2 cord blood (arterial) Latest Ref Range: 42.0 - 56.0 mmHg  57.9 (H)  Bicarbonate Latest Ref Range: 13.0 - 22.0 mmol/L 21.4 23.8 (H)  Ph Cord Blood (Venous) Latest Ref Range: 7.240 - 7.380  7.295   pCO2 Cord Blood (Venous) Latest Ref Range: 42.0 - 56.0  45.5     PROCEDURE IN DETAIL: The patient was taken to the operating room where anesthesia was administered and normal fetal heart tones were confirmed. She was then prepped and draped in the normal fashion in the dorsal supine position with a leftward tilt.  After a time out was performed, a pfannensteil  skin incision was made with the scalpel and carried through to the underlying layer of fascia. The fascia was then incised at the midline and  this incision was extended laterally with the mayo scissors. Attention was turned to the superior aspect of the fascial incision which was grasped with the kocher clamps x 2, tented up and the rectus muscles were dissected off with the bovie. In a similar fashion the inferior aspect of the fascial incision was grasped with the kocher clamps, tented up and the rectus muscles dissected off with the mayo scissors. The rectus muscles were then separated in the midline and the peritoneum was entered bluntly. The bladder blade was inserted and the vesicouterine peritoneum was identified, tented up and entered with the metzenbaum scissors. This incision was extended laterally and the bladder flap was created digitally. The bladder blade was reinserted.  A low transverse hysterotomy was made with the scalpel until the endometrial cavity was breached and the amniotic sac ruptured yielding clear amniotic fluid. This incision was extended bluntly and the infant's head was deeply impacted so decision was made to deliver the fetus breech. The legs were grasped and the body ben out of the hysterotomy to provide neck flexion. The infant was then easily delivered.The cord was clamped x 2 and cut, and the infant was handed to the awaiting pediatricians. Delayed cord clamping was not done.  The placenta was then gradually expressed from the uterus and then the uterus was exteriorized and cleared of all clots and debris. The hysterotomy was repaired with a running suture of 1-0 monocryl. A second imbricating layer of 1-0 monocryl suture was then placed. Several figure-of-eight sutures of #1 chromic were added to achieve excellent hemostasis, especially at the site of the right hysterotomy  extension.   The uterus and adnexa were then returned to the abdomen, and the hysterotomy and all operative sites were reinspected and excellent hemostasis was noted after irrigation and suction of the abdomen with warm saline.  The  peritoneum was closed with a running stitch of 3-0 Vicryl. The fascia was reapproximated with 0 Vicryl in a simple running fashion bilaterally. The subcutaneous layer was then reapproximated with interrupted sutures of 2-0 plain gut, and the skin was then closed with 4-0 monocryl, in a subcuticular fashion. A PICO was then placed.  The patient  tolerated the procedure well. Sponge, lap, needle, and instrument counts were correct x 2. The patient was transferred to the recovery room awake, alert and breathing independently in stable condition.  Cornelia Copaharlie Bayla Mcgovern, Jr. MD Attending Center [facility](Faculty Practice)     Electronically signed by Browning BingPickens, Champ Keetch, MD at 11/02/2016 9:27 AM

## 2016-11-09 NOTE — Progress Notes (Signed)
Subjective:  Veida Allayne GitelmanG Fabry is a 31 y.o. female for postpartum BP check.   Current Outpatient Prescriptions  Medication Sig Dispense Refill  . acetaminophen (TYLENOL) 500 MG chewable tablet Chew 1,000 mg by mouth every 6 (six) hours as needed for pain.     Marland Kitchen. amLODipine (NORVASC) 10 MG tablet Take 1 tablet (10 mg total) by mouth daily. 30 tablet 2  . hydrochlorothiazide (HYDRODIURIL) 25 MG tablet Take 1 tablet (25 mg total) by mouth daily. 30 tablet 2  . ibuprofen (ADVIL,MOTRIN) 600 MG tablet Take 1 tablet (600 mg total) by mouth every 6 (six) hours as needed for headache, mild pain or cramping. 30 tablet 0  . oxyCODONE (OXY IR/ROXICODONE) 5 MG immediate release tablet Take 1 tablet (5 mg total) by mouth every 4 (four) hours as needed (pain scale 4-7). 30 tablet 0  . Prenatal MV-Min-FA-Omega-3 (PRENATAL GUMMIES/DHA & FA) 0.4-32.5 MG CHEW Chew 2 each by mouth at bedtime.    . ranitidine (ZANTAC) 150 MG tablet Take 150-300 mg by mouth 2 (two) times daily as needed for heartburn.     No current facility-administered medications for this visit.     Hypertension ROS: taking medications as instructed, no medication side effects noted, no TIA's, no chest pain on exertion, no dyspnea on exertion and no swelling of ankles.  New concerns: elevated pulse.   Objective:  BP 120/88   Pulse (!) 112   Wt 212 lb (96.2 kg)   BMI 34.22 kg/m   Appearance alert, well appearing, and in no distress. General exam BP noted to be well controlled today in office.    Assessment:   Hypertension stable.   Plan:  Current treatment plan is effective, no change in therapy.. Pt understands symptoms to watch out for to rule out postpartum pre-eclampsia.  Pt is on BabyScripts and will continue to take BP at home weekly until she is 46 weeks postpartum by date.

## 2016-11-10 ENCOUNTER — Encounter (HOSPITAL_COMMUNITY): Payer: Self-pay

## 2016-11-10 ENCOUNTER — Inpatient Hospital Stay (HOSPITAL_COMMUNITY)
Admission: AD | Admit: 2016-11-10 | Discharge: 2016-11-10 | Disposition: A | Discharge status: Home / Self Care | Payer: Medicaid Other | Source: Ambulatory Visit | Attending: Family Medicine | Admitting: Family Medicine

## 2016-11-10 DIAGNOSIS — R519 Headache, unspecified: Secondary | ICD-10-CM

## 2016-11-10 DIAGNOSIS — R51 Headache: Secondary | ICD-10-CM | POA: Diagnosis present

## 2016-11-10 DIAGNOSIS — O9089 Other complications of the puerperium, not elsewhere classified: Secondary | ICD-10-CM | POA: Insufficient documentation

## 2016-11-10 DIAGNOSIS — R1031 Right lower quadrant pain: Secondary | ICD-10-CM | POA: Insufficient documentation

## 2016-11-10 DIAGNOSIS — F329 Major depressive disorder, single episode, unspecified: Secondary | ICD-10-CM | POA: Insufficient documentation

## 2016-11-10 DIAGNOSIS — R1084 Generalized abdominal pain: Secondary | ICD-10-CM

## 2016-11-10 HISTORY — DX: Gestational (pregnancy-induced) hypertension without significant proteinuria, unspecified trimester: O13.9

## 2016-11-10 LAB — URINALYSIS, ROUTINE W REFLEX MICROSCOPIC
BILIRUBIN URINE: NEGATIVE
Bacteria, UA: NONE SEEN
Glucose, UA: NEGATIVE mg/dL
Ketones, ur: NEGATIVE mg/dL
LEUKOCYTES UA: NEGATIVE
Nitrite: NEGATIVE
Protein, ur: NEGATIVE mg/dL
SPECIFIC GRAVITY, URINE: 1.021 (ref 1.005–1.030)
pH: 5 (ref 5.0–8.0)

## 2016-11-10 LAB — COMPREHENSIVE METABOLIC PANEL
ALT: 20 U/L (ref 14–54)
AST: 21 U/L (ref 15–41)
Albumin: 3.8 g/dL (ref 3.5–5.0)
Alkaline Phosphatase: 86 U/L (ref 38–126)
Anion gap: 12 (ref 5–15)
BILIRUBIN TOTAL: 0.6 mg/dL (ref 0.3–1.2)
BUN: 26 mg/dL — AB (ref 6–20)
CO2: 27 mmol/L (ref 22–32)
CREATININE: 1.12 mg/dL — AB (ref 0.44–1.00)
Calcium: 9.4 mg/dL (ref 8.9–10.3)
Chloride: 99 mmol/L — ABNORMAL LOW (ref 101–111)
GFR calc non Af Amer: >60 mL/min (ref >60)
Glucose, Bld: 93 mg/dL (ref 65–99)
POTASSIUM: 3.9 mmol/L (ref 3.5–5.1)
SODIUM: 138 mmol/L (ref 135–145)
TOTAL PROTEIN: 7.2 g/dL (ref 6.5–8.1)

## 2016-11-10 LAB — CBC
HCT: 34.6 % — ABNORMAL LOW (ref 36.0–46.0)
Hemoglobin: 11.2 g/dL — ABNORMAL LOW (ref 12.0–15.0)
MCH: 28.6 pg (ref 26.0–34.0)
MCHC: 32.4 g/dL (ref 30.0–36.0)
MCV: 88.5 fL (ref 78.0–100.0)
PLATELETS: 236 10*3/uL (ref 150–400)
RBC: 3.91 MIL/uL (ref 3.87–5.11)
RDW: 13.9 % (ref 11.5–15.5)
WBC: 5.9 10*3/uL (ref 4.0–10.5)

## 2016-11-10 LAB — PROTEIN / CREATININE RATIO, URINE
CREATININE, URINE: 162 mg/dL
PROTEIN CREATININE RATIO: 0.09 mg/mg{creat} (ref 0.00–0.15)
TOTAL PROTEIN, URINE: 15 mg/dL

## 2016-11-10 NOTE — MAU Provider Note (Signed)
History     CSN: [id number]  Arrival date and time: 11/10/16 2030   None     Chief Complaint  Patient presents with  . Abdominal Pain   HPI Patient developed headache, RUQ and right lower abdominal pain 1 hour ago. Describes her pain as stabbing. Sudden onset. No vision changes noted. She took her BP at home today and it was 112/96. She has been compliant with medications at home including Norvasc and HCTZ. [facility]. She was walking around the grocery store today when the symptoms started.   PPD #9 s/p pLTCS for failed VAVD and arrest of descent. Pregnancy complicated by severe pre-eclampsia: s/p magnesium sulfate. Norvasc started for BP control; HCTZ added for improved control.  Past Medical History:  Diagnosis Date  . ADD (attention deficit disorder)   . Appendicitis 09/28/2016  . Chronic ear infection   . Depression   . 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 Pregnancy induced hypertension   . 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 APPENDECTOMY N/A 09/28/2016   Procedure: APPENDECTOMY;  Surgeon: Avel Peaceosenbower, Todd, MD;  Location: [facility];  Service: General;  Laterality: N/A;  . CESAREAN SECTION N/A 11/01/2016   Procedure: CESAREAN SECTION;  Surgeon:  BingPickens, Charlie, MD;  [facility];  Service: Obstetrics;  Laterality: N/A;  . TONSILLECTOMY    . TYMPANOPLASTY Right   . WISDOM TOOTH EXTRACTION      Family History  Problem Relation Age of Onset  . Depression Mother   . Diabetes Father     Social History  Substance Use Topics  . Smoking status: Never Smoker  . Smokeless tobacco: Never Used  . Alcohol use 0.0 oz/week    Allergies:  Allergies  Allergen Reactions  . Sulfa Antibiotics Other (See Comments)    Reaction:  Stevens-Johnson Syndrome     Prescriptions Prior to Admission  Medication Sig Dispense Refill Last Dose   . acetaminophen (TYLENOL) 500 MG chewable tablet Chew 1,000 mg by mouth every 6 (six) hours as needed for pain.    11/10/2016 at Unknown time  . amLODipine (NORVASC) 10 MG tablet Take 1 tablet (10 mg total) by mouth daily. 30 tablet 2 11/10/2016 at Unknown time  . hydrochlorothiazide (HYDRODIURIL) 25 MG tablet Take 1 tablet (25 mg total) by mouth daily. 30 tablet 2 11/10/2016 at Unknown time  . ibuprofen (ADVIL,MOTRIN) 600 MG tablet Take 1 tablet (600 mg total) by mouth every 6 (six) hours as needed for headache, mild pain or cramping. 30 tablet 0 11/10/2016 at Unknown time  . oxyCODONE (OXY IR/ROXICODONE) 5 MG immediate release tablet Take 1 tablet (5 mg total) by mouth every 4 (four) hours as needed (pain scale 4-7). 30 tablet 0 11/09/2016 at Unknown time  . Prenatal MV-Min-FA-Omega-3 (PRENATAL GUMMIES/DHA & FA) 0.4-32.5 MG CHEW Chew 2 each by mouth at bedtime.   11/09/2016 at Unknown time  . ranitidine (ZANTAC) 150 MG tablet Take 150-300 mg by mouth 2 (two) times daily as needed for heartburn.   Past Week at Unknown time    Review of Systems  All systems reviewed and are negative for acute change except as noted in the HPI.  Physical Exam   Blood pressure (!) 131/95, pulse 91, temperature 97.8 F (36.6 C), temperature source Oral, resp. rate 16, height 5\' 6"  (1.676 m), weight 98 kg (  216 lb), SpO2 98 %, currently breastfeeding.  Physical Exam  Constitutional: She is oriented to person, place, and time. She appears well-developed and well-nourished. No distress.  HENT:  Head: Normocephalic and atraumatic.  Cardiovascular: Normal rate.   Respiratory: Effort normal.  GI: Soft.  Neurological: She is alert and oriented to person, place, and time.  Skin: Skin is warm and dry.  Psychiatric: She has a normal mood and affect. Her behavior is normal.    MAU Course  Procedures  MDM: Pre-E focused labs- CBC, CMP, PCR, and UA- wnl No severe range pressures noted while in MAU  Assessment and Plan   Postpartum headache  Generalized abdominal pain  -No concern for pre-E at this time -Patient reassured and instructed to continue taking her BP medication and checking her blood pressure at home -Patient instructed too take her ibuprofen and tylenol at different times in order to improve efficacy -Patient encouraged to go to her follow up appointment for blood pressure check on Monday at [facility] -Patient instructed to return if symptoms worsen, do not resolve, or she has change in vision.  [place] Shirley  11/10/2016, 10:16 PM   OB FELLOW MAU DISCHARGE ATTESTATION  I have seen and examined this patient. I agree with above documentation in resident's note and have made edits as needed.    Caryl Ada, DO OB Fellow 10:38 PM

## 2016-11-10 NOTE — Progress Notes (Signed)
Pt tried to get baby latched on and reports she usually uses nipple shield but left at home.  Pt prefers to use Alimentum formula now as she was previously instructed when here to supplement with but pt didn't bring with her.  States she doesn't use the formula often, usually pumps or uses the nipple shield and baby latches on well. Called nursery, spoke to [facility] Health SolutionsMarti RN,  Alimentum formula given. Pt has appt with [facility] next week and states baby has been gaining weight. Encouraged her also to call our lactation Monday if needed. Pt voiced understanding.

## 2016-11-10 NOTE — MAU Note (Addendum)
Had C/S 8/2 due to OP presentation. Was induced early due to  preeclampsia. Spent wk pp in hosp and was on Mag. HOme past Monday. Having some pain RUQ and RLQ of abd. Slight headache and alittle light-headed. Took ibuprofen and tylenol at 1845

## 2016-11-12 ENCOUNTER — Ambulatory Visit: Payer: Medicaid Other | Admitting: *Deleted

## 2016-11-12 ENCOUNTER — Encounter: Payer: Self-pay | Admitting: *Deleted

## 2016-11-12 VITALS — BP 120/88 | HR 112 | Wt 212.0 lb

## 2016-11-12 DIAGNOSIS — O1493 Unspecified pre-eclampsia, third trimester: Secondary | ICD-10-CM

## 2016-11-14 ENCOUNTER — Other Ambulatory Visit (HOSPITAL_COMMUNITY): Payer: Self-pay | Admitting: Obstetrics and Gynecology

## 2016-11-19 ENCOUNTER — Other Ambulatory Visit: Payer: Self-pay

## 2016-11-19 MED ORDER — VALACYCLOVIR HCL 1 G PO TABS: ORAL_TABLET | ORAL | 0 refills | Status: DC

## 2016-11-21 ENCOUNTER — Telehealth: Payer: Self-pay

## 2016-11-21 ENCOUNTER — Other Ambulatory Visit: Payer: Self-pay

## 2016-11-21 DIAGNOSIS — R52 Pain, unspecified: Secondary | ICD-10-CM

## 2016-11-21 MED ORDER — IBUPROFEN 600 MG PO TABS: 600.0000 mg | ORAL_TABLET | Freq: Four times a day (QID) | ORAL | 0 refills | Status: DC | PRN

## 2016-11-21 NOTE — Telephone Encounter (Signed)
Patient called requesting an extension on her disability.  Her orders say to return next week, but that was from her appendectomy 09/28/2016.  Patient would like to be out on leave until January.  She delivered 11/01/2016.  She is worried about driving 45 minutes back and forth to work.  Patient is still taking her pain medication.  Per Dr Alvester Morin, patient can be out for 12-16 weeks post partum for mother baby bonding and body healing.

## 2016-11-29 ENCOUNTER — Ambulatory Visit: Payer: Medicaid Other | Admitting: Obstetrics and Gynecology

## 2016-12-04 ENCOUNTER — Ambulatory Visit (INDEPENDENT_AMBULATORY_CARE_PROVIDER_SITE_OTHER): Payer: Medicaid Other | Admitting: Obstetrics and Gynecology

## 2016-12-04 ENCOUNTER — Encounter: Payer: Self-pay | Admitting: Obstetrics and Gynecology

## 2016-12-04 NOTE — Progress Notes (Signed)
Obstetrics Visit Postpartum Visit  Appointment Date: 12/04/2016  [facility]  Primary Care Provider: Patient, No Pcp Per  Chief Complaint: regular PP visit  History of Present Illness: Katherina MiresCrystal G Biegel is a 31 y.o. Caucasian G1P1001 (No LMP recorded.), seen for the above chief complaint. Her past medical history is significant for severe pre-x and c/s   She is s/p 8/2 on pLTCS for arrest of descent; she was discharged to home on POD#3. She stopped her meds a few weeks ago (HCTZ and norvasc)  Vaginal bleeding or discharge: occasional spotting Breast or formula feeding: both Intercourse: No  Contraception after delivery: No  PP depression s/s: Yes  Any bowel or bladder issues: No  Pap smear: no abnormalities (date: NILM/HPV neg)  Review of Systems:  as noted in the History of Present Illness.  Medications Ms. Emerson MonteJerrell had no medications administered during this visit. Current Outpatient Prescriptions  Medication Sig Dispense Refill  . ibuprofen (ADVIL,MOTRIN) 600 MG tablet Take 1 tablet (600 mg total) by mouth every 6 (six) hours as needed for headache, mild pain or cramping. 30 tablet 0  . oxyCODONE (OXY IR/ROXICODONE) 5 MG immediate release tablet Take 1 tablet (5 mg total) by mouth every 4 (four) hours as needed (pain scale 4-7). 30 tablet 0  . Prenatal MV-Min-FA-Omega-3 (PRENATAL GUMMIES/DHA & FA) 0.4-32.5 MG CHEW Chew 2 each by mouth at bedtime.    . valACYclovir (VALTREX) 1000 MG tablet Take half tablet for suppression and a whole tablet for outbreaks 180 tablet 0  . acetaminophen (TYLENOL) 500 MG chewable tablet Chew 1,000 mg by mouth every 6 (six) hours as needed for pain.      No current facility-administered medications for this visit.     Allergies Sulfa antibiotics  Physical Exam:  BP 125/89   Pulse 88   Wt 212 lb (96.2 kg)   BMI 34.22 kg/m  Body mass index is 34.22 kg/m. General appearance: Well nourished, well  developed female in no acute distress.  Cardiovascular: normal s1 and s2.  No murmurs, rubs or gallops. Respiratory:  Clear to auscultation bilateral. Normal respiratory effort Abdomen: positive bowel sounds and no masses, hernias; diffusely non tender to palpation, non distended. Well healed low transverse skin incision Neuro/Psych:  Normal mood and affect.  Skin:  Warm and dry.   Laboratory: none  PP Depression Screening:  9, #10 never  Assessment: pt doing well  Plan:  BC options d/w pt and she'll consider her options FOB not involved and doesn't know she was ever pregnant. Pt lives with her daughter and patient's mom is over to help a lot and lives [phone] away and she has other family members that help. PP depression d/w pt and told to use other family members to help alleviate stress, since they are willing and wanting to help.  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 Pt also told normal to have some AUB with breast feeding but told to let us know if persists after October.  RTC PRN  Cornelia Copaharlie Breunna Nordmann, Jr MD Attending [facility](Faculty Practice)

## 2016-12-26 ENCOUNTER — Telehealth: Payer: Self-pay | Admitting: *Deleted

## 2016-12-26 MED ORDER — BREAST PUMP MISC: 0 refills | Status: DC

## 2016-12-26 NOTE — Telephone Encounter (Signed)
-----   Message from Lindell Spar, [place] sent at 12/26/2016  9:43 AM EDT ----- Regarding: needs breast pump Rx Contact: [phone] Pt called requesting a Rx for a breast pump.

## 2017-01-10 ENCOUNTER — Ambulatory Visit (INDEPENDENT_AMBULATORY_CARE_PROVIDER_SITE_OTHER): Payer: Medicaid Other | Admitting: Obstetrics and Gynecology

## 2017-01-10 ENCOUNTER — Encounter: Payer: Self-pay | Admitting: Obstetrics and Gynecology

## 2017-01-10 VITALS — BP 118/84 | HR 101 | Ht 66.0 in | Wt 214.2 lb

## 2017-01-10 DIAGNOSIS — Z23 Encounter for immunization: Secondary | ICD-10-CM | POA: Diagnosis not present

## 2017-01-10 DIAGNOSIS — F53 Postpartum depression: Secondary | ICD-10-CM

## 2017-01-10 DIAGNOSIS — O99345 Other mental disorders complicating the puerperium: Principal | ICD-10-CM

## 2017-01-10 MED ORDER — SERTRALINE HCL 50 MG PO TABS: 50.0000 mg | ORAL_TABLET | Freq: Every day | ORAL | 6 refills | Status: DC

## 2017-01-10 NOTE — Addendum Note (Signed)
Addended by: Marchelle Folks on: 01/10/2017 03:14 PM   Modules accepted: Orders

## 2017-01-10 NOTE — Patient Instructions (Signed)
1. Begin Zoloft 25 mg a day for a week, then 50 mg a day thereafter 2. Return in 6 weeks for follow-up  Postpartum Depression and Baby Blues The postpartum period begins right after the birth of a baby. During this time, there is often a great amount of joy and excitement. It is also a time of many changes in the life of the parents. Regardless of how many times a mother gives birth, each child brings new challenges and dynamics to the family. It is not unusual to have feelings of excitement along with confusing shifts in moods, emotions, and thoughts. All mothers are at risk of developing postpartum depression or the "baby blues." These mood changes can occur right after giving birth, or they may occur many months after giving birth. The baby blues or postpartum depression can be mild or severe. Additionally, postpartum depression can go away rather quickly, or it can be a long-term condition. What are the causes? Raised hormone levels and the rapid drop in those levels are thought to be a main cause of postpartum depression and the baby blues. A number of hormones change during and after pregnancy. Estrogen and progesterone usually decrease right after the delivery of your baby. The levels of thyroid hormone and various cortisol steroids also rapidly drop. Other factors that play a role in these mood changes include major life events and genetics. What increases the risk? If you have any of the following risks for the baby blues or postpartum depression, know what symptoms to watch out for during the postpartum period. Risk factors that may increase the likelihood of getting the baby blues or postpartum depression include:  Having a personal or family history of depression.  Having depression while being pregnant.  Having premenstrual mood issues or mood issues related to oral contraceptives.  Having a lot of life stress.  Having marital conflict.  Lacking a social support network.  Having a  baby with special needs.  Having health problems, such as diabetes.  What are the signs or symptoms? Symptoms of baby blues include:  Brief changes in mood, such as going from extreme happiness to sadness.  Decreased concentration.  Difficulty sleeping.  Crying spells, tearfulness.  Irritability.  Anxiety.  Symptoms of postpartum depression typically begin within the first month after giving birth. These symptoms include:  Difficulty sleeping or excessive sleepiness.  Marked weight loss.  Agitation.  Feelings of worthlessness.  Lack of interest in activity or food.  Postpartum psychosis is a very serious condition and can be dangerous. Fortunately, it is rare. Displaying any of the following symptoms is cause for immediate medical attention. Symptoms of postpartum psychosis include:  Hallucinations and delusions.  Bizarre or disorganized behavior.  Confusion or disorientation.  How is this diagnosed? A diagnosis is made by an evaluation of your symptoms. There are no medical or lab tests that lead to a diagnosis, but there are various questionnaires that a health care provider may use to identify those with the baby blues, postpartum depression, or psychosis. Often, a screening tool called the [place] Postnatal Depression Scale is used to diagnose depression in the postpartum period. How is this treated? The baby blues usually goes away on its own in 1-2 weeks. Social support is often all that is needed. You will be encouraged to get adequate sleep and rest. Occasionally, you may be given medicines to help you sleep. Postpartum depression requires treatment because it can last several months or longer if it is not treated. Treatment  may include individual or group therapy, medicine, or both to address any social, physiological, and psychological factors that may play a role in the depression. Regular exercise, a healthy diet, rest, and social support may also be strongly  recommended. Postpartum psychosis is more serious and needs treatment right away. Hospitalization is often needed. Follow these instructions at home:  Get as much rest as you can. Nap when the baby sleeps.  Exercise regularly. Some women find yoga and walking to be beneficial.  Eat a balanced and nourishing diet.  Do little things that you enjoy. Have a cup of tea, take a bubble bath, read your favorite magazine, or listen to your favorite music.  Avoid alcohol.  Ask for help with household chores, cooking, grocery shopping, or running errands as needed. Do not try to do everything.  Talk to people close to you about how you are feeling. Get support from your partner, family members, friends, or other new moms.  Try to stay positive in how you think. Think about the things you are grateful for.  Do not spend a lot of time alone.  Only take over-the-counter or prescription medicine as directed by your health care provider.  Keep all your postpartum appointments.  Let your health care provider know if you have any concerns. Contact a health care provider if: You are having a reaction to or problems with your medicine. Get help right away if:  You have suicidal feelings.  You think you may harm the baby or someone else. This information is not intended to replace advice given to you by your health care provider. Make sure you discuss any questions you have with your health care provider. Document Released: 12/22/2003 Document Revised: 08/25/2015 Document Reviewed: 12/29/2012 Elsevier Interactive Patient Education  2017 [employer].

## 2017-01-10 NOTE — Addendum Note (Signed)
Addended by: Marchelle Folks on: 01/10/2017 03:06 PM   Modules accepted: Orders

## 2017-01-10 NOTE — Progress Notes (Signed)
Chief complaint: 1. Postpartum blues/depression  Patient presents for evaluation of postpartum depression. She is status post primary low cervical transverse cesarean section in August of this year for arrest of descent. She had severe preeclampsia. Pregnancy was complicated by appendicitis. Patient is currently pumping her breasts and giving one bottle of formula per day, otherwise she is using breast milk. Baby is doing well. Patient is now a single parent as father of baby is not involved. She may be going after child support in the near future. The patient is moving into her parents house for assistance with her newborn in the very near future.  PH Q-9 questionnaire-19  Patient is not suicidal.  Past Medical History:  Diagnosis Date  . ADD (attention deficit disorder)   . Appendicitis 09/28/2016  . Chronic ear infection   . Depression   . 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 Preeclampsia, third trimester 10/30/2016  . Pregnancy induced hypertension   . 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 small [facility] 10/25/2016   7/25: 31%, 2555gm, AC 10%, BPP 8/8, normal UA dopplers, normal AFI, cephalic  MFM recs 39wk delivery, no need for further AP testing.    Past Surgical History:  Procedure Laterality Date  . APPENDECTOMY N/A 09/28/2016   Procedure: APPENDECTOMY;  Surgeon: Avel Peace, MD;  Location: [facility];  Service: General;  Laterality: N/A;  . CESAREAN SECTION N/A 11/01/2016   Procedure: CESAREAN SECTION;  Surgeon: Chalfant Bing, MD;  Location: [facility] BIRTHING SUITES;  Service: Obstetrics;  Laterality: N/A;  . TONSILLECTOMY    . TYMPANOPLASTY Right   . WISDOM TOOTH EXTRACTION      OBJECTIVE: BP 118/84   Pulse (!) 101   Ht  (1.676 m)   Wt 214 lb 3.2 oz (97.2 kg)   LMP  (LMP Unknown)   Breastfeeding? Yes   BMI 34.57 kg/m  Pleasant well-appearing female with normal affect. She is alert and oriented.  ASSESSMENT: 1. Postpartum  depression  PLAN: 1. Start Zoloft 25 mg a day for 1 week, then 50 mg a day thereafter 2. Return in 6 weeks for follow-up 3. Patient to move in with parents for support 4. Notify the office or EMS immediately if suicidal thoughts arise.  A total of 15 minutes were spent face-to-face with the patient during this encounter and over half of that time dealt with counseling and coordination of care.  Herold Harms, MD  Note: This dictation was prepared with Dragon dictation along with smaller phrase technology. Any transcriptional errors that result from this process are unintentional.

## 2017-02-26 ENCOUNTER — Encounter: Payer: Medicaid Other | Admitting: Obstetrics and Gynecology

## 2017-03-09 ENCOUNTER — Emergency Department
Admission: EM | Admit: 2017-03-09 | Discharge: 2017-03-10 | Disposition: A | Discharge status: Home / Self Care | Payer: Medicaid Other | Attending: Emergency Medicine | Admitting: Emergency Medicine

## 2017-03-09 DIAGNOSIS — N61 Mastitis without abscess: Secondary | ICD-10-CM | POA: Diagnosis not present

## 2017-03-09 DIAGNOSIS — Z79899 Other long term (current) drug therapy: Secondary | ICD-10-CM | POA: Insufficient documentation

## 2017-03-09 DIAGNOSIS — N644 Mastodynia: Secondary | ICD-10-CM | POA: Diagnosis present

## 2017-03-09 LAB — COMPREHENSIVE METABOLIC PANEL
ALT: 17 U/L (ref 14–54)
ANION GAP: 13 (ref 5–15)
AST: 35 U/L (ref 15–41)
Albumin: 4.2 g/dL (ref 3.5–5.0)
Alkaline Phosphatase: 119 U/L (ref 38–126)
BUN: 21 mg/dL — AB (ref 6–20)
CHLORIDE: 104 mmol/L (ref 101–111)
CO2: 20 mmol/L — ABNORMAL LOW (ref 22–32)
Calcium: 9.5 mg/dL (ref 8.9–10.3)
Creatinine, Ser: 0.95 mg/dL (ref 0.44–1.00)
GFR calc Af Amer: >60 mL/min (ref >60)
Glucose, Bld: 108 mg/dL — ABNORMAL HIGH (ref 65–99)
POTASSIUM: 3.5 mmol/L (ref 3.5–5.1)
Sodium: 137 mmol/L (ref 135–145)
Total Bilirubin: 0.6 mg/dL (ref 0.3–1.2)
Total Protein: 7.3 g/dL (ref 6.5–8.1)

## 2017-03-09 LAB — CBC WITH DIFFERENTIAL/PLATELET
Basophils Absolute: 0 10*3/uL (ref 0–0.1)
Basophils Relative: 0 %
EOS PCT: 0 %
Eosinophils Absolute: 0 10*3/uL (ref 0–0.7)
HEMATOCRIT: 39.2 % (ref 35.0–47.0)
HEMOGLOBIN: 13.1 g/dL (ref 12.0–16.0)
LYMPHS ABS: 0.7 10*3/uL — AB (ref 1.0–3.6)
LYMPHS PCT: 9 %
MCH: 27.3 pg (ref 26.0–34.0)
MCHC: 33.3 g/dL (ref 32.0–36.0)
MCV: 81.8 fL (ref 80.0–100.0)
Monocytes Absolute: 0.7 10*3/uL (ref 0.2–0.9)
Monocytes Relative: 10 %
NEUTROS ABS: 5.6 10*3/uL (ref 1.4–6.5)
NEUTROS PCT: 81 %
Platelets: 167 10*3/uL (ref 150–440)
RBC: 4.8 MIL/uL (ref 3.80–5.20)
RDW: 15.1 % — ABNORMAL HIGH (ref 11.5–14.5)
WBC: 7 10*3/uL (ref 3.6–11.0)

## 2017-03-09 MED ORDER — DEXTROSE 5 % IV SOLN
1.0000 g | Freq: Once | INTRAVENOUS | Status: DC
  Filled 2017-03-09: qty 10

## 2017-03-09 MED ORDER — CEFAZOLIN SODIUM 1 G IJ SOLR
INTRAMUSCULAR | Status: AC
  Administered 2017-03-09: 1000 mg via INTRAVENOUS
  Filled 2017-03-09: qty 10

## 2017-03-09 MED ORDER — CEPHALEXIN 500 MG PO CAPS: 500.0000 mg | ORAL_CAPSULE | Freq: Four times a day (QID) | ORAL | 0 refills | Status: DC

## 2017-03-09 MED ORDER — CEPHALEXIN 500 MG PO CAPS
500.0000 mg | ORAL_CAPSULE | Freq: Three times a day (TID) | ORAL | Status: DC
  Administered 2017-03-10: 500 mg via ORAL
  Filled 2017-03-09 (×9): qty 1

## 2017-03-09 MED ORDER — ONDANSETRON 4 MG PO TBDP: ORAL_TABLET | ORAL | 0 refills | Status: DC

## 2017-03-09 MED ORDER — CEFAZOLIN SODIUM-DEXTROSE 1-4 GM/50ML-% IV SOLN: 1.0000 g | Freq: Once | INTRAVENOUS | Status: DC

## 2017-03-09 NOTE — Discharge Instructions (Addendum)
Please continue to pump while your infection is resolving.  Use warm compresses on your breast as much as possible to help unclog the ducts.  Complete the full 10-day course of antibiotics (four times daily).  f you develop any new or worsening symptoms that concern you in spite of treatment, please follow up immediately with your OB/GYN or return to the Emergency Department.

## 2017-03-09 NOTE — ED Notes (Signed)
Redness, tenderness, warmth noted to right breast compared to left, pt reports started today.  Pt reports 4 months postpartum, been breast feeding/pumping, report pain worse with pumping.  Pt reports took hot shower and warm compress w/o relief.  Reports fever and chills x 1 day, took ibuprofen at home, afebrile in triage.  Pt reports nausea as well.
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
## 2017-03-09 NOTE — ED Triage Notes (Signed)
Patient reports she believes she has mastitis right breast. Patient c/o redness, swelling, pain to right breast. Patient denies discharge.   Patient reports temp of 104 at home; further reports she took ibuprofen at 2200. Patient is afebrile in triage.

## 2017-03-10 MED ORDER — ONDANSETRON 4 MG PO TBDP: ORAL_TABLET | ORAL | 0 refills | Status: DC

## 2017-03-10 MED ORDER — CEPHALEXIN 500 MG PO CAPS: 500.0000 mg | ORAL_CAPSULE | Freq: Four times a day (QID) | ORAL | 0 refills | Status: AC

## 2017-03-10 MED ORDER — CEFAZOLIN SODIUM-DEXTROSE 1-4 GM-%(50ML) IV SOLR
1.0000 g | Freq: Once | INTRAVENOUS | Status: DC
  Filled 2017-03-10: qty 50

## 2017-03-10 MED ORDER — CEFAZOLIN SODIUM-DEXTROSE 1-4 GM-%(50ML) IV SOLR
1.0000 g | Freq: Once | INTRAVENOUS | Status: DC
  Filled 2017-03-10 (×2): qty 50

## 2017-04-09 ENCOUNTER — Ambulatory Visit (INDEPENDENT_AMBULATORY_CARE_PROVIDER_SITE_OTHER): Payer: Medicaid Other | Admitting: Obstetrics and Gynecology

## 2017-04-09 ENCOUNTER — Encounter: Payer: Self-pay | Admitting: Obstetrics and Gynecology

## 2017-04-09 VITALS — BP 125/82 | HR 83 | Ht 66.0 in | Wt 202.2 lb

## 2017-04-09 DIAGNOSIS — O99345 Other mental disorders complicating the puerperium: Principal | ICD-10-CM

## 2017-04-09 DIAGNOSIS — F53 Postpartum depression: Secondary | ICD-10-CM

## 2017-04-09 DIAGNOSIS — F4322 Adjustment disorder with anxiety: Secondary | ICD-10-CM

## 2017-04-09 MED ORDER — SERTRALINE HCL 50 MG PO TABS: 75.0000 mg | ORAL_TABLET | Freq: Every day | ORAL | 6 refills | Status: DC

## 2017-04-09 MED ORDER — VALACYCLOVIR HCL 1 G PO TABS: ORAL_TABLET | ORAL | 0 refills | Status: DC

## 2017-04-09 MED ORDER — NYSTATIN-TRIAMCINOLONE 100000-0.1 UNIT/GM-% EX OINT: 1.0000 "application " | TOPICAL_OINTMENT | Freq: Two times a day (BID) | CUTANEOUS | 0 refills | Status: DC

## 2017-04-09 NOTE — Progress Notes (Signed)
Chief complaint: 1.  Postpartum depression  Patient presents for 6-week follow-up on sertraline therapy for postpartum/depression.  She has been feeling much improved but still has occasional symptomatology.  She would like to increase her dosage if possible.  She is not experiencing any side effects.  She denies headaches, stomach cramping, diarrhea, constipation, shortness of breath, chest pain. Baby is doing well with bottle feeding.  Past medical history, past surgical history, problem list, medications, and allergies are reviewed  OBJECTIVE: BP 125/82   Pulse 83   Ht 5\' 6"  (1.676 m)   Wt 202 lb 3.2 oz (91.7 kg)   LMP  (LMP Unknown)   Breastfeeding? Yes   BMI 32.64 kg/m  Pleasant female in no acute distress.  Affect appears appropriate.  ASSESSMENT: 1.  Postpartum depression/blues, responding to SSRI 2.  No medication side effects  PLAN: 1.  Increase Zoloft to 75 mg daily 2.  Monitor symptomatology 3.  Return in 6 weeks for follow-up  A total of 10 minutes were spent face-to-face with the patient during this encounter and over half of that time dealt with counseling and coordination of care.  Herold HarmsMartin A Karl Knarr, MD  Note: This dictation was prepared with Dragon dictation along with smaller phrase technology. Any transcriptional errors that result from this process are unintentional.

## 2017-04-09 NOTE — Patient Instructions (Addendum)
1.  Increase Zoloft to 75 mg a day (1-1/2 tablets a day) 2.  Return in 6 weeks for follow-up 3.  Nystatin/triamcinolone ointment is prescribed for monilia dermatitis per patient request

## 2017-04-10 ENCOUNTER — Other Ambulatory Visit: Payer: Self-pay

## 2017-04-10 MED ORDER — NYSTATIN 100000 UNIT/GM EX OINT: 1.0000 "application " | TOPICAL_OINTMENT | Freq: Two times a day (BID) | CUTANEOUS | 0 refills | Status: DC

## 2017-04-10 MED ORDER — TRIAMCINOLONE ACETONIDE 0.1 % EX OINT: 1.0000 "application " | TOPICAL_OINTMENT | Freq: Two times a day (BID) | CUTANEOUS | 0 refills | Status: DC

## 2017-04-10 MED ORDER — VALACYCLOVIR HCL 1 G PO TABS: 1000.0000 mg | ORAL_TABLET | Freq: Every day | ORAL | 6 refills | Status: DC

## 2017-05-21 ENCOUNTER — Encounter: Payer: Medicaid Other | Admitting: Obstetrics and Gynecology

## 2017-06-06 ENCOUNTER — Encounter: Payer: Self-pay | Admitting: Obstetrics and Gynecology

## 2017-06-06 ENCOUNTER — Ambulatory Visit (INDEPENDENT_AMBULATORY_CARE_PROVIDER_SITE_OTHER): Payer: Medicaid Other | Admitting: Obstetrics and Gynecology

## 2017-06-06 VITALS — BP 109/75 | HR 84 | Temp 97.6°F | Ht 66.0 in | Wt 202.0 lb

## 2017-06-06 DIAGNOSIS — F53 Postpartum depression: Secondary | ICD-10-CM | POA: Diagnosis not present

## 2017-06-06 DIAGNOSIS — J069 Acute upper respiratory infection, unspecified: Secondary | ICD-10-CM

## 2017-06-06 DIAGNOSIS — Z79899 Other long term (current) drug therapy: Secondary | ICD-10-CM

## 2017-06-06 DIAGNOSIS — O99345 Other mental disorders complicating the puerperium: Principal | ICD-10-CM

## 2017-06-06 NOTE — Patient Instructions (Addendum)
1.  Continue Zoloft 100 mg a day 2.  Return in 6 weeks for annual exam  Common Medications Safe in Pregnancy  Acne:      Constipation:  Benzoyl Peroxide     Colace  Clindamycin      Dulcolax Suppository  Topica Erythromycin     Fibercon  Salicylic Acid      Metamucil         Miralax AVOID:        Senakot   Accutane    Cough:  Retin-A       Cough Drops  Tetracycline      Phenergan w/ Codeine if Rx  Minocycline      Robitussin (Plain & DM)  Antibiotics:     Crabs/Lice:  Ceclor       RID  Cephalosporins    AVOID:  E-Mycins      Kwell  Keflex  Macrobid/Macrodantin   Diarrhea:  Penicillin      Kao-Pectate  Zithromax      Imodium AD         PUSH FLUIDS AVOID:       Cipro     Fever:  Tetracycline      Tylenol (Regular or Extra  Minocycline       Strength)  Levaquin      Extra Strength-Do not          Exceed 8 tabs/24 hrs Caffeine:        <259m/day (equiv. To 1 cup of coffee or  approx. 3 12 oz sodas)         Gas: Cold/Hayfever:       Gas-X  Benadryl      Mylicon  Claritin       Phazyme  **Claritin-D        Chlor-Trimeton    Headaches:  Dimetapp      ASA-Free Excedrin  Drixoral-Non-Drowsy     Cold Compress  Mucinex (Guaifenasin)     Tylenol (Regular or Extra  Sudafed/Sudafed-12 Hour     Strength)  **Sudafed PE Pseudoephedrine   Tylenol Cold & Sinus     Vicks Vapor Rub  Zyrtec  **AVOID if Problems With Blood Pressure         Heartburn: Avoid lying down for at least 1 hour after meals  Aciphex      Maalox     Rash:  Milk of Magnesia     Benadryl    Mylanta       1% Hydrocortisone Cream  Pepcid  Pepcid Complete   Sleep Aids:  Prevacid      Ambien   Prilosec       Benadryl  Rolaids       Chamomile Tea  Tums (Limit 4/day)     Unisom  Zantac       Tylenol PM         Warm milk-add vanilla or  Hemorrhoids:       Sugar for taste  Anusol/Anusol H.C.  (RX: Analapram 2.5%)  Sugar Substitutes:  Hydrocortisone OTC     Ok in moderation  Preparation  H      Tucks        Vaseline lotion applied to tissue with wiping    Herpes:     Throat:  Acyclovir      Oragel  Famvir  Valtrex     Vaccines:         Flu Shot Leg Cramps:       *Gardasil  Benadryl  Hepatitis A         Hepatitis B Nasal Spray:       Pneumovax  Saline Nasal Spray     Polio Booster         Tetanus Nausea:       Tuberculosis test or PPD  Vitamin B6 25 mg TID   AVOID:    Dramamine      *Gardasil  Emetrol       Live Poliovirus  Ginger Root 250 mg QID    MMR (measles, mumps &  High Complex Carbs @ Bedtime    rebella)  Sea Bands-Accupressure    Varicella (Chickenpox)  Unisom 1/2 tab TID     *No known complications           If received before Pain:         Known pregnancy;   Darvocet       Resume series after  Lortab        Delivery  Percocet    Yeast:   Tramadol      Femstat  Tylenol 3      Gyne-lotrimin  Ultram       Monistat  Vicodin           MISC:         All Sunscreens           Hair Coloring/highlights          Insect Repellant's          (Including DEET)         Mystic Tans

## 2017-06-10 MED ORDER — SERTRALINE HCL 100 MG PO TABS: 100.0000 mg | ORAL_TABLET | Freq: Every day | ORAL | 1 refills | Status: DC

## 2017-06-10 NOTE — Progress Notes (Signed)
Chief complaint: 1.  Postpartum depression  Patient presents for 6-week follow-up after starting sertraline for postpartum depression.  She states that she bumped her dosage up from 75 mg daily 200 mg daily and has had significant resolution of her symptomatology.  She is not experiencing any medication side effects. Patient has been having upper respiratory infection symptoms for the past 5 days.  She is complaining of a sore throat, cough, nasal congestion.  No significant myalgias  Past medical history: Past surgical history, problem list, medications, and allergies are reviewed  OBJECTIVE: BP 109/75   Pulse 84   Temp 97.6 F (36.4 C)   Ht 5\' 6"  (1.676 m)   Wt 202 lb (91.6 kg)   LMP  (LMP Unknown)   Breastfeeding? Yes   BMI 32.60 kg/m  Pleasant well-appearing female in no acute distress. HEENT exam: Sinus congestion present along with rhinorrhea and cough.  Patient is wearing mask.  She has been taking Tylenol. Lungs: Normal respiratory rate, unlabored  ASSESSMENT: 1.  Postpartum depression, controlled with increased dosage of Zoloft 2.  Probable upper respiratory infection; doubt flu syndrome; symptoms ongoing times 5 days and too late for empiric therapy for flu.  PLAN: 1.  Continue sertraline 100 mg daily 2.  Increase p.o. fluid intake, Tylenol, Robitussin-DM as needed 3.  Return in 6 months for follow-up on sertraline therapy 4.  Return in 6 weeks for annual exam  A total of 15 minutes were spent face-to-face with the patient during this encounter and over half of that time dealt with counseling and coordination of care.  Herold HarmsMartin A Leyah Bocchino, MD  Note: This dictation was prepared with Dragon dictation along with smaller phrase technology. Any transcriptional errors that result from this process are unintentional.

## 2017-07-04 ENCOUNTER — Encounter: Payer: Medicaid Other | Admitting: Obstetrics and Gynecology

## 2017-07-09 ENCOUNTER — Encounter: Payer: Self-pay | Admitting: Obstetrics and Gynecology

## 2017-07-09 ENCOUNTER — Ambulatory Visit: Payer: Medicaid Other | Admitting: Obstetrics and Gynecology

## 2017-07-09 VITALS — BP 117/81 | HR 83 | Ht 66.0 in | Wt 204.5 lb

## 2017-07-09 DIAGNOSIS — Z308 Encounter for other contraceptive management: Secondary | ICD-10-CM

## 2017-07-09 DIAGNOSIS — R638 Other symptoms and signs concerning food and fluid intake: Secondary | ICD-10-CM | POA: Insufficient documentation

## 2017-07-09 DIAGNOSIS — N912 Amenorrhea, unspecified: Secondary | ICD-10-CM

## 2017-07-09 DIAGNOSIS — Z01419 Encounter for gynecological examination (general) (routine) without abnormal findings: Secondary | ICD-10-CM

## 2017-07-09 NOTE — Patient Instructions (Signed)
1.  Pap smear is done 2.  Self breast awareness is encouraged 3.  Continue with prenatal vitamins 4.  Continue with healthy eating and exercise 5.  Return for preop appointment prior to scheduled tubal ligation 6.  Return in 1 year for annual exam  Health Maintenance, Female Adopting a healthy lifestyle and getting preventive care can go a long way to promote health and wellness. Talk with your health care provider about what schedule of regular examinations is right for you. This is a good chance for you to check in with your provider about disease prevention and staying healthy. In between checkups, there are plenty of things you can do on your own. Experts have done a lot of research about which lifestyle changes and preventive measures are most likely to keep you healthy. Ask your health care provider for more information. Weight and diet Eat a healthy diet  Be sure to include plenty of vegetables, fruits, low-fat dairy products, and lean protein.  Do not eat a lot of foods high in solid fats, added sugars, or salt.  Get regular exercise. This is one of the most important things you can do for your health. ? Most adults should exercise for at least 150 minutes each week. The exercise should increase your heart rate and make you sweat (moderate-intensity exercise). ? Most adults should also do strengthening exercises at least twice a week. This is in addition to the moderate-intensity exercise.  Maintain a healthy weight  Body mass index (BMI) is a measurement that can be used to identify possible weight problems. It estimates body fat based on height and weight. Your health care provider can help determine your BMI and help you achieve or maintain a healthy weight.  For females 66 years of age and older: ? A BMI below 18.5 is considered underweight. ? A BMI of 18.5 to 24.9 is normal. ? A BMI of 25 to 29.9 is considered overweight. ? A BMI of 30 and above is considered obese.  Watch  levels of cholesterol and blood lipids  You should start having your blood tested for lipids and cholesterol at 32 years of age, then have this test every 5 years.  You may need to have your cholesterol levels checked more often if: ? Your lipid or cholesterol levels are high. ? You are older than 32 years of age. ? You are at high risk for heart disease.  Cancer screening Lung Cancer  Lung cancer screening is recommended for adults 72-21 years old who are at high risk for lung cancer because of a history of smoking.  A yearly low-dose CT scan of the lungs is recommended for people who: ? Currently smoke. ? Have quit within the past 15 years. ? Have at least a 30-pack-year history of smoking. A pack year is smoking an average of one pack of cigarettes a day for 1 year.  Yearly screening should continue until it has been 15 years since you quit.  Yearly screening should stop if you develop a health problem that would prevent you from having lung cancer treatment.  Breast Cancer  Practice breast self-awareness. This means understanding how your breasts normally appear and feel.  It also means doing regular breast self-exams. Let your health care provider know about any changes, no matter how small.  If you are in your 20s or 30s, you should have a clinical breast exam (CBE) by a health care provider every 1-3 years as part of a regular health  exam.  If you are 40 or older, have a CBE every year. Also consider having a breast X-ray (mammogram) every year.  If you have a family history of breast cancer, talk to your health care provider about genetic screening.  If you are at high risk for breast cancer, talk to your health care provider about having an MRI and a mammogram every year.  Breast cancer gene (BRCA) assessment is recommended for women who have family members with BRCA-related cancers. BRCA-related cancers include: ? Breast. ? Ovarian. ? Tubal. ? Peritoneal  cancers.  Results of the assessment will determine the need for genetic counseling and BRCA1 and BRCA2 testing.  Cervical Cancer Your health care provider may recommend that you be screened regularly for cancer of the pelvic organs (ovaries, uterus, and vagina). This screening involves a pelvic examination, including checking for microscopic changes to the surface of your cervix (Pap test). You may be encouraged to have this screening done every 3 years, beginning at age 41.  For women ages 30-65, health care providers may recommend pelvic exams and Pap testing every 3 years, or they may recommend the Pap and pelvic exam, combined with testing for human papilloma virus (HPV), every 5 years. Some types of HPV increase your risk of cervical cancer. Testing for HPV may also be done on women of any age with unclear Pap test results.  Other health care providers may not recommend any screening for nonpregnant women who are considered low risk for pelvic cancer and who do not have symptoms. Ask your health care provider if a screening pelvic exam is right for you.  If you have had past treatment for cervical cancer or a condition that could lead to cancer, you need Pap tests and screening for cancer for at least 20 years after your treatment. If Pap tests have been discontinued, your risk factors (such as having a new sexual partner) need to be reassessed to determine if screening should resume. Some women have medical problems that increase the chance of getting cervical cancer. In these cases, your health care provider may recommend more frequent screening and Pap tests.  Colorectal Cancer  This type of cancer can be detected and often prevented.  Routine colorectal cancer screening usually begins at 32 years of age and continues through 32 years of age.  Your health care provider may recommend screening at an earlier age if you have risk factors for colon cancer.  Your health care provider may also  recommend using home test kits to check for hidden blood in the stool.  A small camera at the end of a tube can be used to examine your colon directly (sigmoidoscopy or colonoscopy). This is done to check for the earliest forms of colorectal cancer.  Routine screening usually begins at age 50.  Direct examination of the colon should be repeated every 5-10 years through 33 years of age. However, you may need to be screened more often if early forms of precancerous polyps or small growths are found.  Skin Cancer  Check your skin from head to toe regularly.  Tell your health care provider about any new moles or changes in moles, especially if there is a change in a mole's shape or color.  Also tell your health care provider if you have a mole that is larger than the size of a pencil eraser.  Always use sunscreen. Apply sunscreen liberally and repeatedly throughout the day.  Protect yourself by wearing long sleeves, pants, a wide-brimmed  hat, and sunglasses whenever you are outside.  Heart disease, diabetes, and high blood pressure  High blood pressure causes heart disease and increases the risk of stroke. High blood pressure is more likely to develop in: ? People who have blood pressure in the high end of the normal range (130-139/85-89 mm Hg). ? People who are overweight or obese. ? People who are African American.  If you are 69-90 years of age, have your blood pressure checked every 3-5 years. If you are 67 years of age or older, have your blood pressure checked every year. You should have your blood pressure measured twice-once when you are at a hospital or clinic, and once when you are not at a hospital or clinic. Record the average of the two measurements. To check your blood pressure when you are not at a hospital or clinic, you can use: ? An automated blood pressure machine at a pharmacy. ? A home blood pressure monitor.  If you are between 44 years and 25 years old, ask your  health care provider if you should take aspirin to prevent strokes.  Have regular diabetes screenings. This involves taking a blood sample to check your fasting blood sugar level. ? If you are at a normal weight and have a low risk for diabetes, have this test once every three years after 32 years of age. ? If you are overweight and have a high risk for diabetes, consider being tested at a younger age or more often. Preventing infection Hepatitis B  If you have a higher risk for hepatitis B, you should be screened for this virus. You are considered at high risk for hepatitis B if: ? You were born in a country where hepatitis B is common. Ask your health care provider which countries are considered high risk. ? Your parents were born in a high-risk country, and you have not been immunized against hepatitis B (hepatitis B vaccine). ? You have HIV or AIDS. ? You use needles to inject street drugs. ? You live with someone who has hepatitis B. ? You have had sex with someone who has hepatitis B. ? You get hemodialysis treatment. ? You take certain medicines for conditions, including cancer, organ transplantation, and autoimmune conditions.  Hepatitis C  Blood testing is recommended for: ? Everyone born from [birth year] through 1965. ? Anyone with known risk factors for hepatitis C.  Sexually transmitted infections (STIs)  You should be screened for sexually transmitted infections (STIs) including gonorrhea and chlamydia if: ? You are sexually active and are younger than 32 years of age. ? You are older than 32 years of age and your health care provider tells you that you are at risk for this type of infection. ? Your sexual activity has changed since you were last screened and you are at an increased risk for chlamydia or gonorrhea. Ask your health care provider if you are at risk.  If you do not have HIV, but are at risk, it may be recommended that you take a prescription medicine daily to  prevent HIV infection. This is called pre-exposure prophylaxis (PrEP). You are considered at risk if: ? You are sexually active and do not regularly use condoms or know the HIV status of your partner(s). ? You take drugs by injection. ? You are sexually active with a partner who has HIV.  Talk with your health care provider about whether you are at high risk of being infected with HIV. If you choose to begin  PrEP, you should first be tested for HIV. You should then be tested every 3 months for as long as you are taking PrEP. Pregnancy  If you are premenopausal and you may become pregnant, ask your health care provider about preconception counseling.  If you may become pregnant, take 400 to 800 micrograms (mcg) of folic acid every day.  If you want to prevent pregnancy, talk to your health care provider about birth control (contraception). Osteoporosis and menopause  Osteoporosis is a disease in which the bones lose minerals and strength with aging. This can result in serious bone fractures. Your risk for osteoporosis can be identified using a bone density scan.  If you are 55 years of age or older, or if you are at risk for osteoporosis and fractures, ask your health care provider if you should be screened.  Ask your health care provider whether you should take a calcium or vitamin D supplement to lower your risk for osteoporosis.  Menopause may have certain physical symptoms and risks.  Hormone replacement therapy may reduce some of these symptoms and risks. Talk to your health care provider about whether hormone replacement therapy is right for you. Follow these instructions at home:  Schedule regular health, dental, and eye exams.  Stay current with your immunizations.  Do not use any tobacco products including cigarettes, chewing tobacco, or electronic cigarettes.  If you are pregnant, do not drink alcohol.  If you are breastfeeding, limit how much and how often you drink  alcohol.  Limit alcohol intake to no more than 1 drink per day for nonpregnant women. One drink equals 12 ounces of beer, 5 ounces of wine, or 1 ounces of hard liquor.  Do not use street drugs.  Do not share needles.  Ask your health care provider for help if you need support or information about quitting drugs.  Tell your health care provider if you often feel depressed.  Tell your health care provider if you have ever been abused or do not feel safe at home. This information is not intended to replace advice given to you by your health care provider. Make sure you discuss any questions you have with your health care provider. Document Released: 10/02/2010 Document Revised: 08/25/2015 Document Reviewed: 12/21/2014 Elsevier Interactive Patient Education  [employer].

## 2017-07-09 NOTE — Progress Notes (Signed)
ANNUAL PREVENTATIVE CARE GYN  ENCOUNTER NOTE  Subjective:       Allison Moreno is a 32 y.o. G47P1001 female here for a routine annual gynecologic exam.  Current complaints: 1.  Discuss tubal; patient desires permanent sterilization.  Currently she is breast-feeding and has lactational amenorrhea.  She is exercising regularly. No major interval health issues have been noted since delivery 8 months ago.  Last year she did have open appendectomy due to appendicitis at [redacted] weeks gestation.  Subsequently she had severe preeclampsia with need for cesarean section delivery at 36 weeks.   Gynecologic History lmp- unknown- breastfeeding Contraception: none Last Pap: 05/2016 neg/neg/neg  Last mammogram: n/a   Obstetric History OB History  Gravida Para Term Preterm AB Living  1 1 1     1   SAB TAB Ectopic Multiple Live Births        0 1    # Outcome Date GA Lbr Len/2nd Weight Sex Delivery Anes PTL Lv  1 Term 11/01/16 [redacted]w[redacted]d 19:38 / 05:06 5 lb 3.1 oz (2.355 kg) F CS-LTranv EPI  LIV    Past Medical History:  Diagnosis Date  . ADD (attention deficit disorder)   . Appendicitis 09/28/2016  . Chronic ear infection   . Depression   . 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 Preeclampsia, third trimester 10/30/2016  . Pregnancy induced hypertension   . 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 small [facility] 10/25/2016   7/25: 31%, 2555gm, AC 10%, BPP 8/8, normal UA dopplers, normal AFI, cephalic  MFM recs 39wk delivery, no need for further AP testing.     Past Surgical History:  Procedure Laterality Date  . APPENDECTOMY N/A 09/28/2016   Procedure: APPENDECTOMY;  Surgeon: Avel Peace, MD;  Location: [facility] ORS;  Service: General;  Laterality: N/A;  . CESAREAN SECTION N/A 11/01/2016   Procedure: CESAREAN SECTION;  Surgeon: Index Bing, MD;  Location: [facility] BIRTHING SUITES;  Service: Obstetrics;  Laterality: N/A;  . TONSILLECTOMY    . TYMPANOPLASTY Right   . WISDOM TOOTH  EXTRACTION      Current Outpatient Medications on File Prior to Visit  Medication Sig Dispense Refill  . nystatin ointment (MYCOSTATIN) Apply 1 application topically 2 (two) times daily. 30 g 0  . ondansetron (ZOFRAN ODT) 4 MG disintegrating tablet Allow 1-2 tablets to dissolve in your mouth every 8 hours as needed for nausea/vomiting 30 tablet 0  . Prenatal MV-Min-FA-Omega-3 (PRENATAL GUMMIES/DHA & FA) 0.4-32.5 MG CHEW Chew 2 each by mouth at bedtime.    . sertraline (ZOLOFT) 50 MG tablet Take 1.5 tablets (75 mg total) by mouth daily. 45 tablet 6  . triamcinolone ointment (KENALOG) 0.1 % Apply 1 application topically 2 (two) times daily. 30 g 0  . valACYclovir (VALTREX) 1000 MG tablet Take half tablet for suppression and a whole tablet for outbreaks 180 tablet 0   No current facility-administered medications on file prior to visit.     Allergies  Allergen Reactions  . Lisdexamfetamine Swelling    Hypertension  . Sulfa Antibiotics Other (See Comments)    Reaction:  Stevens-Johnson Syndrome     Social History   Socioeconomic History  . Marital status: Single    Spouse name: Not on file  . Number of children: Not on file  . Years of education: Not on file  . Highest education level: Not on file  Occupational History  . Not on file  Social Needs  .  Financial resource strain: Not on file  . Food insecurity:    Worry: Not on file    Inability: Not on file  . Transportation needs:    Medical: Not on file    Non-medical: Not on file  Tobacco Use  . Smoking status: Never Smoker  . Smokeless tobacco: Never Used  Substance and Sexual Activity  . Alcohol use: No    Alcohol/week: 0.0 oz  . Drug use: No  . Sexual activity: Not Currently    Partners: Male    Birth control/protection: None  Lifestyle  . Physical activity:    Days per week: Not on file    Minutes per session: Not on file  . Stress: Not on file  Relationships  . Social connections:    Talks on phone: Not on  file    Gets together: Not on file    Attends religious service: Not on file    Active member of club or organization: Not on file    Attends meetings of clubs or organizations: Not on file    Relationship status: Not on file  . Intimate partner violence:    Fear of current or ex partner: Not on file    Emotionally abused: Not on file    Physically abused: Not on file    Forced sexual activity: Not on file  Other Topics Concern  . Not on file  Social History Narrative  . Not on file    Family History  Problem Relation Age of Onset  . Depression Mother   . Diabetes Father   . Breast cancer Neg Hx   . Ovarian cancer Neg Hx   . Colon cancer Neg Hx     The following portions of the patient's history were reviewed and updated as appropriate: allergies, current medications, past family history, past medical history, past social history, past surgical history and problem list.  Review of Systems Review of Systems  Constitutional: Negative.   Eyes: Negative.   Respiratory: Negative.   Cardiovascular: Negative.   Gastrointestinal: Negative.   Genitourinary: Negative.        Amenorrhea due to lactation  Musculoskeletal: Negative.   Skin: Negative.   Neurological: Negative.   Endo/Heme/Allergies: Negative.   Psychiatric/Behavioral: Negative.     Objective:   BP 117/81   Pulse 83   Ht 5\' 6"  (1.676 m)   Wt 204 lb 8 oz (92.8 kg)   LMP  (LMP Unknown)   Breastfeeding? Yes   BMI 33.01 kg/m  CONSTITUTIONAL: Well-developed, well-nourished female in no acute distress.  PSYCHIATRIC: Normal mood and affect. Normal behavior. Normal judgment and thought content. NEUROLGIC: Alert and oriented to person, place, and time. Normal muscle tone coordination. No cranial nerve deficit noted. HENT:  Normocephalic, atraumatic, External right and left ear normal. EYES: Conjunctivae and EOM are normal. No scleral icterus.  NECK: Normal range of motion, supple, no masses.  Normal thyroid.   SKIN: Skin is warm and dry. No rash noted. Not diaphoretic. No erythema. No pallor. CARDIOVASCULAR: Normal heart rate noted, regular rhythm, no murmur. RESPIRATORY: Clear to auscultation bilaterally. Effort and breath sounds normal, no problems with respiration noted. BREASTS: Symmetric in size. No masses, skin changes, or lymphadenopathy; lactation changes present without evidence of mastitis. ABDOMEN: Soft, normal bowel sounds, no distention noted.  No tenderness, rebound or guarding.  Well-healed open appendectomy incision and well-healed Pfannenstiel incision from C-section BLADDER: Normal PELVIC:  External Genitalia: Normal  BUS: Normal  Vagina: Normal  Cervix: Normal;  no lesions; no cervical motion tenderness  Uterus: Normal; midplane, normal size shape, mobile  Adnexa: Normal; nonpalpable nontender  RV: External Exam NormaI  MUSCULOSKELETAL: Normal range of motion. No tenderness.  No cyanosis, clubbing, or edema.  2+ distal pulses. LYMPHATIC: No Axillary, Supraclavicular, or Inguinal Adenopathy.    Assessment:   Annual gynecologic examination 32 y.o. Contraception: abstinence [id number] History of cesarean section History of severe preeclampsia History of appendectomy during pregnancy Desires sterilization  Plan:  Pap: Pap Co Test Mammogram: Not Indicated Stool Guaiac Testing:  Not Indicated Labs: lpid a1c fbs tsh Routine preventative health maintenance measures emphasized: Exercise/Diet/Weight control, Tobacco Warnings, Alcohol/Substance use risks and Safe Sex Permanent sterilization has been discussed including failure rate of 1/300.  Patient understands the permanent nature of the procedure which is NOT REVERSIBLE. Laparoscopic BTB is scheduled.  Patient is to return for preop appointment the week before surgery Return to Clinic - [address]   Shawntae WilberMiller, [place]MA  Herold HarmsMartin A Maymunah Stegemann, MD  Note: This dictation was prepared with Dragon dictation along with smaller phrase  technology. Any transcriptional errors that result from this process are unintentional.

## 2017-07-12 ENCOUNTER — Other Ambulatory Visit: Payer: Self-pay

## 2017-07-12 ENCOUNTER — Encounter: Payer: Self-pay | Admitting: Obstetrics and Gynecology

## 2017-07-12 LAB — IGP, COBASHPV16/18
HPV 16: NEGATIVE
HPV 18: NEGATIVE
HPV OTHER HR TYPES: NEGATIVE
PAP Smear Comment: 0

## 2017-07-12 MED ORDER — DICLOXACILLIN SODIUM 500 MG PO CAPS: 500.0000 mg | ORAL_CAPSULE | Freq: Four times a day (QID) | ORAL | 0 refills | Status: DC

## 2017-07-18 ENCOUNTER — Other Ambulatory Visit: Payer: Medicaid Other

## 2017-08-13 ENCOUNTER — Other Ambulatory Visit: Payer: Self-pay

## 2017-08-13 ENCOUNTER — Encounter: Payer: Self-pay | Admitting: Obstetrics and Gynecology

## 2017-08-13 MED ORDER — SERTRALINE HCL 50 MG PO TABS: 75.0000 mg | ORAL_TABLET | Freq: Every day | ORAL | 6 refills | Status: DC

## 2017-08-18 ENCOUNTER — Encounter: Payer: Self-pay | Admitting: Gynecology

## 2017-08-18 ENCOUNTER — Other Ambulatory Visit: Payer: Self-pay

## 2017-08-18 ENCOUNTER — Ambulatory Visit
Admission: EM | Admit: 2017-08-18 | Discharge: 2017-08-18 | Disposition: A | Discharge status: Home / Self Care | Payer: Medicaid Other | Attending: Family Medicine | Admitting: Family Medicine

## 2017-08-18 DIAGNOSIS — F4322 Adjustment disorder with anxiety: Secondary | ICD-10-CM | POA: Diagnosis not present

## 2017-08-18 DIAGNOSIS — J209 Acute bronchitis, unspecified: Secondary | ICD-10-CM | POA: Diagnosis not present

## 2017-08-18 DIAGNOSIS — J208 Acute bronchitis due to other specified organisms: Secondary | ICD-10-CM | POA: Insufficient documentation

## 2017-08-18 DIAGNOSIS — R05 Cough: Secondary | ICD-10-CM | POA: Diagnosis not present

## 2017-08-18 DIAGNOSIS — F988 Other specified behavioral and emotional disorders with onset usually occurring in childhood and adolescence: Secondary | ICD-10-CM | POA: Insufficient documentation

## 2017-08-18 DIAGNOSIS — Z79899 Other long term (current) drug therapy: Secondary | ICD-10-CM | POA: Insufficient documentation

## 2017-08-18 DIAGNOSIS — R059 Cough, unspecified: Secondary | ICD-10-CM

## 2017-08-18 DIAGNOSIS — F329 Major depressive disorder, single episode, unspecified: Secondary | ICD-10-CM | POA: Diagnosis not present

## 2017-08-18 DIAGNOSIS — B9789 Other viral agents as the cause of diseases classified elsewhere: Secondary | ICD-10-CM

## 2017-08-18 LAB — RAPID STREP SCREEN (MED CTR MEBANE ONLY): Streptococcus, Group A Screen (Direct): NEGATIVE

## 2017-08-18 MED ORDER — BENZONATATE 100 MG PO CAPS: 100.0000 mg | ORAL_CAPSULE | Freq: Three times a day (TID) | ORAL | 0 refills | Status: DC | PRN

## 2017-08-18 NOTE — ED Provider Notes (Signed)
[facility]    CSN: [id number] Arrival date & time: 08/18/17  1059  History   Chief Complaint Chief Complaint  Patient presents with  . Cough  . Appointment   HPI  32 year old female presents with cough.  1 week history of cough.  Seems to be worsening.  She reports associated sore throat.  Reports some ear pain as well.  Associated chest discomfort from the cough.  She states that her sputum is now discolored.  No fevers or chills.  No known exacerbating relieving factors.  She is breast-feeding.  Past Medical History:  Diagnosis Date  . ADD (attention deficit disorder)   . Appendicitis 09/28/2016  . Chronic ear infection   . Depression   . 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 Preeclampsia, third trimester 10/30/2016  . Pregnancy induced hypertension   . 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 small [facility] 10/25/2016   7/25: 31%, 2555gm, AC 10%, BPP 8/8, normal UA dopplers, normal AFI, cephalic  MFM recs 39wk delivery, no need for further AP testing.     Patient Active Problem List   Diagnosis Date Noted  . Lactational amenorrhea 07/09/2017  . Increased BMI 07/09/2017  . Medication management 06/06/2017  . Adopted 05/03/2016  . Adjustment disorder with anxious mood 12/21/2014  . 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 APPENDECTOMY N/A 09/28/2016   Procedure: APPENDECTOMY;  Surgeon: Avel Peace, MD;  Location: [facility];  Service: General;  Laterality: N/A;  . CESAREAN SECTION N/A 11/01/2016   Procedure: CESAREAN SECTION;  Surgeon: [place] Bing, MD;  Location: [facility] BIRTHING SUITES;  Service: Obstetrics;  Laterality: N/A;  . TONSILLECTOMY    . TYMPANOPLASTY Right   . WISDOM TOOTH EXTRACTION      OB History    Gravida  1   Para  1   Term  1   Preterm      AB      Living  1     SAB      TAB      Ectopic      Multiple  0   Live Births  1            Home Medications    Prior to Admission medications   Medication Sig Start Date End Date Taking? Authorizing Provider  nystatin ointment (MYCOSTATIN) Apply 1 application topically 2 (two) times daily. 04/10/17  Yes Defrancesco, Prentice Docker, MD  ondansetron (ZOFRAN ODT) 4 MG disintegrating tablet Allow 1-2 tablets to dissolve in your mouth every 8 hours as needed for nausea/vomiting 03/10/17  Yes Loleta Rose, MD  Prenatal MV-Min-FA-Omega-3 (PRENATAL GUMMIES/DHA & FA) 0.4-32.5 MG CHEW Chew 2 each by mouth at bedtime.   Yes [provider]  sertraline (ZOLOFT) 50 MG tablet Take 1.5 tablets (75 mg total) by mouth daily. 08/13/17  Yes Defrancesco, Prentice Docker, MD  triamcinolone ointment (KENALOG) 0.1 % Apply 1 application topically 2 (two) times daily. 04/10/17  Yes Defrancesco, Prentice Docker, MD  valACYclovir (VALTREX) 1000 MG tablet Take half tablet for suppression and a whole tablet for outbreaks 04/09/17  Yes Defrancesco, Prentice Docker, MD  benzonatate (TESSALON) 100 MG capsule Take 1 capsule (100 mg total) by mouth 3 (three) times daily as needed. 08/18/17   Tommie Sams, DO    Family History Family History  Problem Relation Age of Onset  . Depression Mother   .  Diabetes Father   . Breast cancer Neg Hx   . Ovarian cancer Neg Hx   . Colon cancer Neg Hx     Social History Social History   Tobacco Use  . Smoking status: Never Smoker  . Smokeless tobacco: Never Used  Substance Use Topics  . Alcohol use: No    Alcohol/week: 0.0 oz  . Drug use: No     Allergies   Lisdexamfetamine and Sulfa antibiotics   Review of Systems Review of Systems  Constitutional: Negative for fever.  HENT: Positive for ear pain and sore throat.   Respiratory: Positive for cough and chest tightness.    Physical Exam Triage Vital Signs ED Triage Vitals  Enc Vitals Group     BP 08/18/17 1115 (!) 115/93     Pulse Rate 08/18/17 1115 86     Resp 08/18/17 1115 16     Temp 08/18/17  1115 98 F (36.7 C)     Temp Source 08/18/17 1115 Oral     SpO2 08/18/17 1115 99 %     Weight 08/18/17 1112 200 lb (90.7 kg)     Height 08/18/17 1112  (1.676 m)     Head Circumference --      Peak Flow --      Pain Score 08/18/17 1112 0     Pain Loc --      Pain Edu? --      Excl. in GC? --    Updated Vital Signs BP (!) 115/93 (BP Location: Left Arm)   Pulse 86   Temp 98 F (36.7 C) (Oral)   Resp 16   Ht  (1.676 m)   Wt 200 lb (90.7 kg)   LMP 07/15/2017   SpO2 99%   Breastfeeding? Yes Comment: pump  BMI 32.28 kg/m   Physical Exam  Constitutional: She is oriented to person, place, and time. She appears well-developed. No distress.  HENT:  Head: Normocephalic and atraumatic.  Right Ear: Tympanic membrane normal.  Left Ear: Tympanic membrane normal.  Mouth/Throat: Oropharynx is clear and moist.  Cardiovascular: Normal rate and regular rhythm.  Pulmonary/Chest: Effort normal and breath sounds normal. She has no wheezes. She has no rales.  Neurological: She is alert and oriented to person, place, and time.  Psychiatric: She has a normal mood and affect. Her behavior is normal.  Nursing note and vitals reviewed.  UC Treatments / Results  Labs (all labs ordered are listed, but only abnormal results are displayed) Labs Reviewed  RAPID STREP SCREEN ([facility] ONLY)  CULTURE, GROUP A STREP [facility])    EKG None  Radiology No results found.  Procedures Procedures (including critical care time)  Medications Ordered in UC Medications - No data to display  Initial Impression / Assessment and Plan / UC Course  I have reviewed the triage vital signs and the nursing notes.  Pertinent labs & imaging results that were available during my care of the patient were reviewed by me and considered in my medical decision making (see chart for details).    32 year old female presents with cough.  This is secondary to viral bronchitis.  Treating with Tessalon Perles.   Supportive care.  Final Clinical Impressions(s) / UC Diagnoses   Final diagnoses:  Cough     Discharge Instructions     This is viral bronchitis.  Cough medication as prescribed.  This will slowly improve.  Take care  Dr. Adriana Simas    ED Prescriptions  Medication Sig Dispense Auth. Provider   benzonatate (TESSALON) 100 MG capsule Take 1 capsule (100 mg total) by mouth 3 (three) times daily as needed. 30 capsule Tommie Sams, DO     Controlled Substance Prescriptions [place] Controlled Substance Registry consulted? Not Applicable   Tommie Sams, DO 08/18/17 1137

## 2017-08-18 NOTE — ED Triage Notes (Signed)
Patient c/o cough x over 1 week. Pt. With nasal congestion and sore throat.

## 2017-08-18 NOTE — Discharge Instructions (Signed)
This is viral bronchitis.  Cough medication as prescribed.  This will slowly improve.  Take care  Dr. Adriana Simas

## 2017-08-21 LAB — CULTURE, GROUP A STREP (THRC)

## 2018-03-06 ENCOUNTER — Ambulatory Visit: Payer: Medicaid Other | Admitting: Family Medicine

## 2018-03-14 ENCOUNTER — Ambulatory Visit: Payer: Medicaid Other | Admitting: Family Medicine

## 2018-03-14 ENCOUNTER — Encounter: Payer: Self-pay | Admitting: Family Medicine

## 2018-03-14 VITALS — BP 109/78 | HR 84 | Temp 98.6°F | Ht 67.3 in | Wt 217.6 lb

## 2018-03-14 DIAGNOSIS — F419 Anxiety disorder, unspecified: Secondary | ICD-10-CM

## 2018-03-14 DIAGNOSIS — Z7689 Persons encountering health services in other specified circumstances: Secondary | ICD-10-CM

## 2018-03-14 DIAGNOSIS — F988 Other specified behavioral and emotional disorders with onset usually occurring in childhood and adolescence: Secondary | ICD-10-CM | POA: Diagnosis not present

## 2018-03-14 MED ORDER — HYDROXYZINE HCL 25 MG PO TABS: 25.0000 mg | ORAL_TABLET | Freq: Three times a day (TID) | ORAL | 0 refills | Status: AC | PRN

## 2018-03-14 MED ORDER — BUSPIRONE HCL 10 MG PO TABS: 10.0000 mg | ORAL_TABLET | Freq: Two times a day (BID) | ORAL | 0 refills | Status: DC

## 2018-03-14 NOTE — Assessment & Plan Note (Signed)
Stable on rare prn use of adderall through past Psychiatrist

## 2018-03-14 NOTE — Progress Notes (Signed)
BP 109/78   Pulse 84   Temp 98.6 F (37 C) (Oral)   Ht 5' 7.3" (1.709 m)   Wt 217 lb 9.6 oz (98.7 kg)   LMP 02/12/2018 (Approximate)   SpO2 98%   BMI 33.78 kg/m    Subjective:    Patient ID: Allison Moreno, female    DOB: 1985/10/10, 31 y.o.   MRN: [id number]  HPI: Allison Moreno is a 32 y.o. female  Chief Complaint  Patient presents with  . Establish Care  . Anxiety   Hx of anxiety, depression, and ADD. Not currently on any medications. Was previously on zoloft which didn't help. Also tried wellbutrin which did not work either. Seeing a counselor regularly. Anxiety is the most bothersome now, did have PPD after having her daughter but that has seemed to resolve. Denies SI/HI. Having significant issue sleeping.   Hx of pre-eclampsia and pregnancy induced HTN, was on medications for a month afterward. BPs stable without any CP, SOB, dizziness, HAs since.   On adderall 15 mg through her previous psychiatrist prn for ADD, rarely uses it and states she has 2 bottles leftover at home if she does need some.   Relevant past medical, surgical, family and social history reviewed and updated as indicated. Interim medical history since our last visit reviewed. Allergies and medications reviewed and updated.  Review of Systems  Per HPI unless specifically indicated above     Objective:    BP 109/78   Pulse 84   Temp 98.6 F (37 C) (Oral)   Ht 5' 7.3" (1.709 m)   Wt 217 lb 9.6 oz (98.7 kg)   LMP 02/12/2018 (Approximate)   SpO2 98%   BMI 33.78 kg/m   Wt Readings from Last 3 Encounters:  03/14/18 217 lb 9.6 oz (98.7 kg)  08/18/17 200 lb (90.7 kg)  07/09/17 204 lb 8 oz (92.8 kg)    Physical Exam Vitals signs and nursing note reviewed.  Constitutional:      Appearance: Normal appearance. She is not ill-appearing.  HENT:     Head: Atraumatic.  Eyes:     Extraocular Movements: Extraocular movements intact.     Conjunctiva/sclera: Conjunctivae normal.  Neck:   Musculoskeletal: Normal range of motion and neck supple.  Cardiovascular:     Rate and Rhythm: Normal rate and regular rhythm.     Heart sounds: Normal heart sounds.  Pulmonary:     Effort: Pulmonary effort is normal.     Breath sounds: Normal breath sounds.  Musculoskeletal: Normal range of motion.  Skin:    General: Skin is warm and dry.  Neurological:     Mental Status: She is alert and oriented to person, place, and time.  Psychiatric:        Mood and Affect: Mood normal.        Thought Content: Thought content normal.        Judgment: Judgment normal.     Results for orders placed or performed during the hospital encounter of 08/18/17  Rapid Strep Screen ([facility] ONLY)  Result Value Ref Range   Streptococcus, Group A Screen (Direct) NEGATIVE NEGATIVE  Culture, group A strep  Result Value Ref Range   Specimen Description      THROAT Performed at [facility], [address], [place], [place] [address]    Special Requests      NONE Reflexed from [id number] Performed at [facility], [address] [place],  [place] [address]    Culture      NO GROUP A STREP (S.PYOGENES) ISOLATED Performed at [facility], [address] [place], [place] [address]    Report Status 08/21/2017 FINAL       Assessment & Plan:   Problem List Items Addressed This Visit      Other   ADD (attention deficit disorder) without hyperactivity    Stable on rare prn use of adderall through past Psychiatrist      Anxiety    Failed wellbutrin and zoloft in the past. Start buspar 10 mg BID and prn hydroxyzine for spot treatment/sleep. Continue regular counseling sessions. F/u in 1 month      Relevant Medications   busPIRone (BUSPAR) 10 MG tablet   hydrOXYzine (ATARAX/VISTARIL) 25 MG tablet    Other Visit Diagnoses    Encounter to establish care    -  Primary       Follow up plan: Return for CPE ASAP, 1 month anxiety f/u.

## 2018-03-14 NOTE — Assessment & Plan Note (Signed)
Failed wellbutrin and zoloft in the past. Start buspar 10 mg BID and prn hydroxyzine for spot treatment/sleep. Continue regular counseling sessions. F/u in 1 month

## 2018-03-19 ENCOUNTER — Encounter: Payer: Self-pay | Admitting: Family Medicine

## 2018-03-19 ENCOUNTER — Ambulatory Visit (INDEPENDENT_AMBULATORY_CARE_PROVIDER_SITE_OTHER): Payer: Medicaid Other | Admitting: Family Medicine

## 2018-03-19 VITALS — BP 112/77 | HR 79 | Temp 98.0°F | Ht 68.0 in | Wt 212.0 lb

## 2018-03-19 DIAGNOSIS — Z0000 Encounter for general adult medical examination without abnormal findings: Secondary | ICD-10-CM | POA: Diagnosis not present

## 2018-03-19 DIAGNOSIS — F419 Anxiety disorder, unspecified: Secondary | ICD-10-CM

## 2018-03-19 LAB — UA/M W/RFLX CULTURE, ROUTINE
BILIRUBIN UA: NEGATIVE
Glucose, UA: NEGATIVE
Ketones, UA: NEGATIVE
Nitrite, UA: NEGATIVE
PH UA: 6 (ref 5.0–7.5)
Specific Gravity, UA: 1.025 (ref 1.005–1.030)
UUROB: 0.2 mg/dL (ref 0.2–1.0)

## 2018-03-19 LAB — MICROSCOPIC EXAMINATION: RBC, UA: NONE SEEN /hpf (ref 0–2)

## 2018-03-19 NOTE — Progress Notes (Signed)
BP 112/77   Pulse 79   Temp 98 F (36.7 C) (Oral)   Ht 5\' 8"  (1.727 m)   Wt 212 lb (96.2 kg)   LMP 02/12/2018 (Within Days)   SpO2 98%   BMI 32.23 kg/m    Subjective:    Patient ID: Allison Moreno, female    DOB: 1985/07/14, 32 y.o.   MRN: [id number]  HPI: Allison Moreno is a 32 y.o. female presenting on 03/19/2018 for comprehensive medical examination. Current medical complaints include:see below  Did not start the buspar because she saw something online saying it was sedating. Wanting a Psychiatry referral for management of her anxiety and ADD.   She currently lives with: Menopausal Symptoms: no  Depression Screen done today and results listed below:  Depression screen [facility] 2/9 03/19/2018 03/14/2018 04/09/2017 12/21/2014  Decreased Interest 1 1 0 0  Down, Depressed, Hopeless 1 1 0 0  PHQ - 2 Score 2 2 0 0  Altered sleeping 2 2 1  -  Tired, decreased energy 1 1 1  -  Change in appetite 2 0 1 -  Feeling bad or failure about yourself  0 1 0 -  Trouble concentrating 1 0 0 -  Moving slowly or fidgety/restless 0 0 1 -  Suicidal thoughts 0 0 0 -  PHQ-9 Score 8 6 4  -  Difficult doing work/chores - Somewhat difficult - -    The patient does not have a history of falls. I did not complete a risk assessment for falls. A plan of care for falls was not documented.   Past Medical History:  Past Medical History:  Diagnosis Date  . ADD (attention deficit disorder)   . ADHD   . Anxiety   . Appendicitis 09/28/2016  . Chronic ear infection   . Depression   . 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 Neuromuscular disorder (HCC)   . Preeclampsia, third trimester 10/30/2016  . Pregnancy induced hypertension   . 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 small [facility] 10/25/2016   7/25: 31%, 2555gm, AC 10%, BPP 8/8, normal UA dopplers, normal AFI, cephalic  MFM recs 39wk delivery, no need for further AP testing.     Surgical History:  Past Surgical History:   Procedure Laterality Date  . APPENDECTOMY N/A 09/28/2016   Procedure: APPENDECTOMY;  Surgeon: Avel Peace, MD;  Location: [facility];  Service: General;  Laterality: N/A;  . CESAREAN SECTION N/A 11/01/2016   Procedure: CESAREAN SECTION;  Surgeon: [place] Bing, MD;  Location: [facility] BIRTHING SUITES;  Service: Obstetrics;  Laterality: N/A;  . TONSILLECTOMY    . TYMPANOPLASTY Right   . WISDOM TOOTH EXTRACTION      Medications:  Current Outpatient Medications on File Prior to Visit  Medication Sig  . amphetamine-dextroamphetamine (ADDERALL) 15 MG tablet Take 15 mg by mouth daily as needed.  . hydrOXYzine (ATARAX/VISTARIL) 25 MG tablet Take 1 tablet (25 mg total) by mouth 3 (three) times daily as needed.  . busPIRone (BUSPAR) 10 MG tablet Take 1 tablet (10 mg total) by mouth 2 (two) times daily. (Patient not taking: Reported on 03/19/2018)   No current facility-administered medications on file prior to visit.     Allergies:  Allergies  Allergen Reactions  . Lisdexamfetamine Swelling    Hypertension  . Sulfa Antibiotics Other (See Comments)    Reaction:  Stevens-Johnson Syndrome     Social History:  Social History   Socioeconomic History  .  Marital status: Single    Spouse name: Not on file  . Number of children: Not on file  . Years of education: Not on file  . Highest education level: Not on file  Occupational History  . Not on file  Social Needs  . Financial resource strain: Not on file  . Food insecurity:    Worry: Not on file    Inability: Not on file  . Transportation needs:    Medical: Not on file    Non-medical: Not on file  Tobacco Use  . Smoking status: Never Smoker  . Smokeless tobacco: Never Used  Substance and Sexual Activity  . Alcohol use: No    Alcohol/week: 0.0 standard drinks  . Drug use: No  . Sexual activity: Not Currently    Partners: Male    Birth control/protection: None  Lifestyle  . Physical activity:    Days per week: 3 days    Minutes  per session: 60 min  . Stress: Not on file  Relationships  . Social connections:    Talks on phone: Not on file    Gets together: Not on file    Attends religious service: Not on file    Active member of club or organization: Not on file    Attends meetings of clubs or organizations: Not on file    Relationship status: Not on file  . Intimate partner violence:    Fear of current or ex partner: Not on file    Emotionally abused: Not on file    Physically abused: Not on file    Forced sexual activity: Not on file  Other Topics Concern  . Not on file  Social History Narrative  . Not on file   Social History   Tobacco Use  Smoking Status Never Smoker  Smokeless Tobacco Never Used   Social History   Substance and Sexual Activity  Alcohol Use No  . Alcohol/week: 0.0 standard drinks    Family History:  Family History  Adopted: Yes  Problem Relation Age of Onset  . Depression Mother   . Diabetes Father   . Diabetes Paternal Grandfather   . Breast cancer Neg Hx   . Ovarian cancer Neg Hx   . Colon cancer Neg Hx     Past medical history, surgical history, medications, allergies, family history and social history reviewed with patient today and changes made to appropriate areas of the chart.   Review of Systems - General ROS: negative Psychological ROS: negative Ophthalmic ROS: negative ENT ROS: negative Allergy and Immunology ROS: negative Hematological and Lymphatic ROS: negative Endocrine ROS: negative Breast ROS: negative for breast lumps Respiratory ROS: no cough, shortness of breath, or wheezing Cardiovascular ROS: no chest pain or dyspnea on exertion Gastrointestinal ROS: no abdominal pain, change in bowel habits, or black or bloody stools Genito-Urinary ROS: no dysuria, trouble voiding, or hematuria Musculoskeletal ROS: negative Neurological ROS: no TIA or stroke symptoms Dermatological ROS: negative All other ROS negative except what is listed above and in  the HPI.      Objective:    BP 112/77   Pulse 79   Temp 98 F (36.7 C) (Oral)   Ht 5\' 8"  (1.727 m)   Wt 212 lb (96.2 kg)   LMP 02/12/2018 (Within Days)   SpO2 98%   BMI 32.23 kg/m   Wt Readings from Last 3 Encounters:  03/19/18 212 lb (96.2 kg)  03/14/18 217 lb 9.6 oz (98.7 kg)  08/18/17 200 lb (90.7  kg)    Physical Exam Vitals signs and nursing note reviewed.  Constitutional:      General: She is not in acute distress.    Appearance: She is well-developed.  HENT:     Head: Atraumatic.     Right Ear: External ear normal.     Left Ear: External ear normal.     Nose: Nose normal.     Mouth/Throat:     Pharynx: No oropharyngeal exudate.  Eyes:     General: No scleral icterus.    Conjunctiva/sclera: Conjunctivae normal.     Pupils: Pupils are equal, round, and reactive to light.  Neck:     Musculoskeletal: Normal range of motion and neck supple.     Thyroid: No thyromegaly.  Cardiovascular:     Rate and Rhythm: Normal rate and regular rhythm.     Heart sounds: Normal heart sounds.  Pulmonary:     Effort: Pulmonary effort is normal. No respiratory distress.     Breath sounds: Normal breath sounds.  Abdominal:     General: Bowel sounds are normal.     Palpations: Abdomen is soft. There is no mass.     Tenderness: There is no abdominal tenderness.  Musculoskeletal: Normal range of motion.        General: No tenderness.  Lymphadenopathy:     Cervical: No cervical adenopathy.  Skin:    General: Skin is warm and dry.     Findings: No rash.  Neurological:     Mental Status: She is alert and oriented to person, place, and time.     Cranial Nerves: No cranial nerve deficit.  Psychiatric:        Behavior: Behavior normal.     Results for orders placed or performed in visit on 03/19/18  Microscopic Examination  Result Value Ref Range   WBC, UA 0-5 0 - 5 /hpf   RBC, UA None seen 0 - 2 /hpf   Epithelial Cells (non renal) 0-10 0 - 10 /hpf   Mucus, UA Present Not  Estab.   Bacteria, UA Few None seen/Few   Yeast, UA Present (A) None seen  CBC with Differential/Platelet  Result Value Ref Range   WBC 3.8 3.4 - 10.8 x10E3/uL   RBC 4.87 3.77 - 5.28 x10E6/uL   Hemoglobin 13.9 11.1 - 15.9 g/dL   Hematocrit 40.9 81.1 - 46.6 %   MCV 87 79 - 97 fL   MCH 28.5 26.6 - 33.0 pg   MCHC 32.7 31.5 - 35.7 g/dL   RDW 91.4 78.2 - 95.6 %   Platelets 211 150 - 450 x10E3/uL   Neutrophils 46 Not Estab. %   Lymphs 42 Not Estab. %   Monocytes 10 Not Estab. %   Eos 1 Not Estab. %   Basos 1 Not Estab. %   Neutrophils Absolute 1.8 1.4 - 7.0 x10E3/uL   Lymphocytes Absolute 1.6 0.7 - 3.1 x10E3/uL   Monocytes Absolute 0.4 0.1 - 0.9 x10E3/uL   EOS (ABSOLUTE) 0.0 0.0 - 0.4 x10E3/uL   Basophils Absolute 0.0 0.0 - 0.2 x10E3/uL   Immature Granulocytes 0 Not Estab. %   Immature Grans (Abs) 0.0 0.0 - 0.1 x10E3/uL  Comprehensive metabolic panel  Result Value Ref Range   Glucose 76 65 - 99 mg/dL   BUN 18 6 - 20 mg/dL   Creatinine, Ser 2.13 (H) 0.57 - 1.00 mg/dL   GFR calc non Af Amer 72 >59 mL/min/1.73   GFR calc Af Amer 83 >59 mL/min/1.73  BUN/Creatinine Ratio 17 9 - 23   Sodium 143 134 - 144 mmol/L   Potassium 3.8 3.5 - 5.2 mmol/L   Chloride 106 96 - 106 mmol/L   CO2 23 20 - 29 mmol/L   Calcium 8.7 8.7 - 10.2 mg/dL   Total Protein 5.4 (L) 6.0 - 8.5 g/dL   Albumin 3.6 3.5 - 5.5 g/dL   Globulin, Total 1.8 1.5 - 4.5 g/dL   Albumin/Globulin Ratio 2.0 1.2 - 2.2   Bilirubin Total 0.3 0.0 - 1.2 mg/dL   Alkaline Phosphatase 72 39 - 117 IU/L   AST 20 0 - 40 IU/L   ALT 16 0 - 32 IU/L  Lipid Panel w/o Chol/HDL Ratio  Result Value Ref Range   Cholesterol, Total 154 100 - 199 mg/dL   Triglycerides 161102 0 - 149 mg/dL   HDL 42 >09>39 mg/dL   VLDL Cholesterol Cal 20 5 - 40 mg/dL   LDL Calculated 92 0 - 99 mg/dL  TSH  Result Value Ref Range   TSH 1.820 0.450 - 4.500 uIU/mL  UA/M w/rflx Culture, Routine  Result Value Ref Range   Specific Gravity, UA 1.025 1.005 - 1.030   pH,  UA 6.0 5.0 - 7.5   Color, UA Yellow Yellow   Appearance Ur Cloudy (A) Clear   Leukocytes, UA Trace (A) Negative   Protein, UA Trace (A) Negative/Trace   Glucose, UA Negative Negative   Ketones, UA Negative Negative   RBC, UA Trace (A) Negative   Bilirubin, UA Negative Negative   Urobilinogen, Ur 0.2 0.2 - 1.0 mg/dL   Nitrite, UA Negative Negative   Microscopic Examination See below:       Assessment & Plan:   Problem List Items Addressed This Visit      Other   Anxiety    Referral placed to Psychiatry. Can start buspar in meantime if she chooses      Relevant Orders   Ambulatory referral to Psychiatry    Other Visit Diagnoses    Annual physical exam    -  Primary   Relevant Orders   CBC with Differential/Platelet (Completed)   Comprehensive metabolic panel (Completed)   Lipid Panel w/o Chol/HDL Ratio (Completed)   TSH (Completed)   UA/M w/rflx Culture, Routine (Completed)       Follow up plan: Return in about 4 weeks (around 04/16/2018) for Anxiety f/u if not already with Psychiatry.   LABORATORY TESTING:  - Pap smear: up to date  IMMUNIZATIONS:   - Tdap: Tetanus vaccination status reviewed: last tetanus booster within 10 years. - Influenza: Up to date  PATIENT COUNSELING:   Advised to take 1 mg of folate supplement per day if capable of pregnancy.   Sexuality: Discussed sexually transmitted diseases, partner selection, use of condoms, avoidance of unintended pregnancy  and contraceptive alternatives.   Advised to avoid cigarette smoking.  I discussed with the patient that most people either abstain from alcohol or drink within safe limits (<=14/week and <=4 drinks/occasion for males, <=7/weeks and <= 3 drinks/occasion for females) and that the risk for alcohol disorders and other health effects rises proportionally with the number of drinks per week and how often a drinker exceeds daily limits.  Discussed cessation/primary prevention of drug use and  availability of treatment for abuse.   Diet: Encouraged to adjust caloric intake to maintain  or achieve ideal body weight, to reduce intake of dietary saturated fat and total fat, to limit sodium intake by avoiding high sodium  foods and not adding table salt, and to maintain adequate dietary potassium and calcium preferably from fresh fruits, vegetables, and low-fat dairy products.    stressed the importance of regular exercise  Injury prevention: Discussed safety belts, safety helmets, smoke detector, smoking near bedding or upholstery.   Dental health: Discussed importance of regular tooth brushing, flossing, and dental visits.    NEXT PREVENTATIVE PHYSICAL DUE IN 1 YEAR. Return in about 4 weeks (around 04/16/2018) for Anxiety f/u if not already with Psychiatry.

## 2018-03-20 LAB — COMPREHENSIVE METABOLIC PANEL
ALBUMIN: 3.6 g/dL (ref 3.5–5.5)
ALT: 16 IU/L (ref 0–32)
AST: 20 IU/L (ref 0–40)
Albumin/Globulin Ratio: 2 (ref 1.2–2.2)
Alkaline Phosphatase: 72 IU/L (ref 39–117)
BUN / CREAT RATIO: 17 (ref 9–23)
BUN: 18 mg/dL (ref 6–20)
Bilirubin Total: 0.3 mg/dL (ref 0.0–1.2)
CALCIUM: 8.7 mg/dL (ref 8.7–10.2)
CO2: 23 mmol/L (ref 20–29)
Chloride: 106 mmol/L (ref 96–106)
Creatinine, Ser: 1.03 mg/dL — ABNORMAL HIGH (ref 0.57–1.00)
GFR calc Af Amer: 83 mL/min/{1.73_m2} (ref >59)
GFR, EST NON AFRICAN AMERICAN: 72 mL/min/{1.73_m2} (ref >59)
GLOBULIN, TOTAL: 1.8 g/dL (ref 1.5–4.5)
GLUCOSE: 76 mg/dL (ref 65–99)
Potassium: 3.8 mmol/L (ref 3.5–5.2)
SODIUM: 143 mmol/L (ref 134–144)
TOTAL PROTEIN: 5.4 g/dL — AB (ref 6.0–8.5)

## 2018-03-20 LAB — CBC WITH DIFFERENTIAL/PLATELET
Basophils Absolute: 0 10*3/uL (ref 0.0–0.2)
Basos: 1 %
EOS (ABSOLUTE): 0 10*3/uL (ref 0.0–0.4)
EOS: 1 %
HEMATOCRIT: 42.5 % (ref 34.0–46.6)
HEMOGLOBIN: 13.9 g/dL (ref 11.1–15.9)
IMMATURE GRANS (ABS): 0 10*3/uL (ref 0.0–0.1)
IMMATURE GRANULOCYTES: 0 %
LYMPHS: 42 %
Lymphocytes Absolute: 1.6 10*3/uL (ref 0.7–3.1)
MCH: 28.5 pg (ref 26.6–33.0)
MCHC: 32.7 g/dL (ref 31.5–35.7)
MCV: 87 fL (ref 79–97)
MONOCYTES: 10 %
Monocytes Absolute: 0.4 10*3/uL (ref 0.1–0.9)
NEUTROS PCT: 46 %
Neutrophils Absolute: 1.8 10*3/uL (ref 1.4–7.0)
Platelets: 211 10*3/uL (ref 150–450)
RBC: 4.87 x10E6/uL (ref 3.77–5.28)
RDW: 12.5 % (ref 12.3–15.4)
WBC: 3.8 10*3/uL (ref 3.4–10.8)

## 2018-03-20 LAB — TSH: TSH: 1.82 u[IU]/mL (ref 0.450–4.500)

## 2018-03-20 LAB — LIPID PANEL W/O CHOL/HDL RATIO
Cholesterol, Total: 154 mg/dL (ref 100–199)
HDL: 42 mg/dL (ref >39)
LDL Calculated: 92 mg/dL (ref 0–99)
Triglycerides: 102 mg/dL (ref 0–149)
VLDL CHOLESTEROL CAL: 20 mg/dL (ref 5–40)

## 2018-03-23 NOTE — Assessment & Plan Note (Signed)
Referral placed to Psychiatry. Can start buspar in meantime if she chooses

## 2018-04-02 ENCOUNTER — Encounter: Payer: Self-pay | Admitting: Gynecology

## 2018-04-02 ENCOUNTER — Other Ambulatory Visit: Payer: Self-pay

## 2018-04-02 ENCOUNTER — Ambulatory Visit
Admission: EM | Admit: 2018-04-02 | Discharge: 2018-04-02 | Disposition: A | Discharge status: Home / Self Care | Payer: Medicaid Other | Attending: Physician Assistant | Admitting: Physician Assistant

## 2018-04-02 DIAGNOSIS — J0100 Acute maxillary sinusitis, unspecified: Secondary | ICD-10-CM | POA: Diagnosis not present

## 2018-04-02 DIAGNOSIS — H9201 Otalgia, right ear: Secondary | ICD-10-CM

## 2018-04-02 MED ORDER — FLUTICASONE PROPIONATE 50 MCG/ACT NA SUSP: 1.0000 | Freq: Every day | NASAL | 0 refills | Status: DC

## 2018-04-02 MED ORDER — AMOXICILLIN-POT CLAVULANATE 875-125 MG PO TABS: 1.0000 | ORAL_TABLET | Freq: Two times a day (BID) | ORAL | 0 refills | Status: AC

## 2018-04-02 NOTE — Discharge Instructions (Addendum)
SINUSITIS: Reviewed use of a nasal saline irrigation system. May consider use of intranasal steroids, such as Flonase as well. Use medications as directed. If antibiotics are prescribed, take the full course of antibiotics. Also consider use of Sudafed or Mucinex D. Increase rest, fluids. If you do not improve or if you worsen after a course of antibiotics, you should be re-examined. You may need a different antibiotic or further evaluation with imaging or an exam of the inside of the sinuses  °

## 2018-04-02 NOTE — ED Triage Notes (Signed)
Patient c/o right ear pain.

## 2018-04-02 NOTE — ED Provider Notes (Signed)
[facility]    CSN: [id number] Arrival date & time: 04/02/18  1229     History   Chief Complaint No chief complaint on file.   HPI Allison Moreno is a 33 y.o. female. Patient presents with sudden onset of right ear pain (8/10) and right sided maxillary facial pain today. She states that she has had cold symptoms for the past 2 weeks which seemed to be getting better. Denies fever, fatigue, body aches, chills/sweats. Has been taking OTC decongestants. Denies any other concern today.  HPI  Past Medical History:  Diagnosis Date  . ADD (attention deficit disorder)   . ADHD   . Anxiety   . Appendicitis 09/28/2016  . Chronic ear infection   . Depression   . 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 Neuromuscular disorder (HCC)   . Preeclampsia, third trimester 10/30/2016  . Pregnancy induced hypertension   . 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 small [facility] 10/25/2016   7/25: 31%, 2555gm, AC 10%, BPP 8/8, normal UA dopplers, normal AFI, cephalic  MFM recs 39wk delivery, no need for further AP testing.     Patient Active Problem List   Diagnosis Date Noted  . Anxiety 03/14/2018  . Increased BMI 07/09/2017  . Adopted 05/03/2016  . Adjustment disorder with anxious mood 12/21/2014  . 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 APPENDECTOMY N/A 09/28/2016   Procedure: APPENDECTOMY;  Surgeon: Avel Peace, MD;  Location: [facility];  Service: General;  Laterality: N/A;  . CESAREAN SECTION N/A 11/01/2016   Procedure: CESAREAN SECTION;  Surgeon: [place] Bing, MD;  Location: [facility] BIRTHING SUITES;  Service: Obstetrics;  Laterality: N/A;  . TONSILLECTOMY    . TYMPANOPLASTY Right   . WISDOM TOOTH EXTRACTION      OB History    Gravida  1   Para  1   Term  1   Preterm      AB      Living  1     SAB      TAB      Ectopic      Multiple  0   Live  Births  1            Home Medications    Prior to Admission medications   Medication Sig Start Date End Date Taking? Authorizing Provider  amphetamine-dextroamphetamine (ADDERALL) 15 MG tablet Take 15 mg by mouth daily as needed.   Yes [provider]  hydrOXYzine (ATARAX/VISTARIL) 25 MG tablet Take 1 tablet (25 mg total) by mouth 3 (three) times daily as needed. 03/14/18  Yes Particia Nearing, PA-C  amoxicillin-clavulanate (AUGMENTIN) 875-125 MG tablet Take 1 tablet by mouth every 12 (twelve) hours for 7 days. 04/02/18 04/09/18  Eusebio Friendly B, PA-C  busPIRone (BUSPAR) 10 MG tablet Take 1 tablet (10 mg total) by mouth 2 (two) times daily. Patient not taking: Reported on 03/19/2018 03/14/18   Particia Nearing, PA-C  fluticasone [facility]) 50 MCG/ACT nasal spray Place 1 spray into both nostrils daily for 7 days. 04/02/18 04/09/18  Shirlee Latch, PA-C    Family History Family History  Adopted: Yes  Problem Relation Age of Onset  . Depression Mother   . Diabetes Father   . Diabetes Paternal Grandfather   . Breast cancer Neg Hx   . Ovarian cancer Neg Hx   . Colon cancer Neg  Hx     Social History Social History   Tobacco Use  . Smoking status: Never Smoker  . Smokeless tobacco: Never Used  Substance Use Topics  . Alcohol use: No    Alcohol/week: 0.0 standard drinks  . Drug use: No     Allergies   Lisdexamfetamine and Sulfa antibiotics   Review of Systems Review of Systems  Constitutional: Negative for chills, fatigue and fever.  HENT: Positive for congestion, ear pain, rhinorrhea, sinus pressure and sinus pain. Negative for ear discharge, postnasal drip and sore throat.   Eyes: Negative for discharge and redness.  Respiratory: Positive for cough. Negative for shortness of breath and wheezing.   Cardiovascular: Negative for chest pain.  Gastrointestinal: Negative for vomiting.  Musculoskeletal: Negative for arthralgias and myalgias.  Skin: Negative  for color change and rash.  Neurological: Positive for headaches. Negative for dizziness, weakness and light-headedness.  Hematological: Negative for adenopathy.     Physical Exam Triage Vital Signs ED Triage Vitals [04/02/18 1253]  Enc Vitals Group     BP (!) 128/92     Pulse Rate 75     Resp 16     Temp 99 F (37.2 C)     Temp src      SpO2 99 %     Weight      Height      Head Circumference      Peak Flow      Pain Score      Pain Loc      Pain Edu?      Excl. in GC?    No data found.  Updated Vital Signs BP (!) 128/92 (BP Location: Left Arm)   Pulse 75   Temp 99 F (37.2 C)   Resp 16   LMP 03/16/2018   SpO2 99%      Physical Exam Vitals signs and nursing note reviewed.  Constitutional:      General: She is not in acute distress.    Appearance: Normal appearance. She is normal weight. She is not toxic-appearing.  HENT:     Head: Normocephalic and atraumatic.     Right Ear: Ear canal and external ear normal. Tympanic membrane is injected and retracted. Tympanic membrane is not erythematous or bulging.     Left Ear: Tympanic membrane, ear canal and external ear normal.     Nose: Mucosal edema and rhinorrhea (trace purulent ) present.     Right Sinus: Maxillary sinus tenderness present. No frontal sinus tenderness.     Left Sinus: No maxillary sinus tenderness or frontal sinus tenderness.     Mouth/Throat:     Mouth: Mucous membranes are moist.     Pharynx: Oropharynx is clear. No oropharyngeal exudate or posterior oropharyngeal erythema.     Tonsils: No tonsillar exudate. Swelling: 0 on the right.  Neurological:     Mental Status: She is alert.      UC Treatments / Results  Labs (all labs ordered are listed, but only abnormal results are displayed) Labs Reviewed - No data to display  EKG None  Radiology No results found.  Procedures Procedures (including critical care time)  Medications Ordered in UC Medications - No data to  display  Initial Impression / Assessment and Plan / UC Course  I have reviewed the triage vital signs and the nursing notes.  Pertinent labs & imaging results that were available during my care of the patient were reviewed by me and considered in my medical  decision making (see chart for details).    Final Clinical Impressions(s) / UC Diagnoses   Final diagnoses:  Acute non-recurrent maxillary sinusitis  Right ear pain     Discharge Instructions     SINUSITIS: Reviewed use of a nasal saline irrigation system. May consider use of intranasal steroids, such as Flonase as well. Use medications as directed. If antibiotics are prescribed, take the full course of antibiotics. Also consider use of Sudafed or Mucinex D. Increase rest, fluids. If you do not improve or if you worsen after a course of antibiotics, you should be re-examined. You may need a different antibiotic or further evaluation with imaging or an exam of the inside of the sinuses     ED Prescriptions    Medication Sig Dispense Auth. Provider   amoxicillin-clavulanate (AUGMENTIN) 875-125 MG tablet Take 1 tablet by mouth every 12 (twelve) hours for 7 days. 14 tablet Eusebio FriendlyEaves, Lesley B, PA-C   fluticasone (FLONASE) 50 MCG/ACT nasal spray Place 1 spray into both nostrils daily for 7 days. 1 g Shirlee LatchEaves, Lesley B, PA-C     Controlled Substance Prescriptions [place] Controlled Substance Registry consulted? Not Applicable   Gareth Morganaves, Lesley B, PA-C 04/03/18 2052

## 2018-04-08 ENCOUNTER — Ambulatory Visit (INDEPENDENT_AMBULATORY_CARE_PROVIDER_SITE_OTHER): Payer: Medicaid Other | Admitting: Family Medicine

## 2018-04-08 ENCOUNTER — Other Ambulatory Visit: Payer: Self-pay

## 2018-04-08 ENCOUNTER — Encounter: Payer: Self-pay | Admitting: Family Medicine

## 2018-04-08 VITALS — BP 113/83 | HR 97 | Temp 97.6°F | Ht 66.0 in | Wt 219.0 lb

## 2018-04-08 DIAGNOSIS — H6501 Acute serous otitis media, right ear: Secondary | ICD-10-CM | POA: Diagnosis not present

## 2018-04-08 MED ORDER — PSEUDOEPHEDRINE HCL ER 120 MG PO TB12: 120.0000 mg | ORAL_TABLET | Freq: Two times a day (BID) | ORAL | 0 refills | Status: AC

## 2018-04-08 NOTE — Progress Notes (Signed)
BP 113/83   Pulse 97   Temp 97.6 F (36.4 C) (Oral)   Ht 5\' 6"  (1.676 m)   Wt 219 lb (99.3 kg)   LMP 03/16/2018   SpO2 97%   BMI 35.35 kg/m    Subjective:    Patient ID: Allison Moreno, female    DOB: 1985/07/21, 33 y.o.   MRN: [id number]  HPI: Allison Moreno is a 32 y.o. female  Chief Complaint  Patient presents with  . Ear Pain    right ear/ pt states went to Urgent care they prescribed amoxicillin   Here today following up for right ear pain that was diagnosed as acute otitis media at UC 5 days ago. She was given amoxil for which she has been compliant. States the pain has not improved much. Denies fevers, chills, sweats, discharge from ear, muffled hearing, sore throat. Taking flonase once daily as well. Does have a hx of ear infections.   Relevant past medical, surgical, family and social history reviewed and updated as indicated. Interim medical history since our last visit reviewed. Allergies and medications reviewed and updated.  Review of Systems  Per HPI unless specifically indicated above     Objective:    BP 113/83   Pulse 97   Temp 97.6 F (36.4 C) (Oral)   Ht 5\' 6"  (1.676 m)   Wt 219 lb (99.3 kg)   LMP 03/16/2018   SpO2 97%   BMI 35.35 kg/m   Wt Readings from Last 3 Encounters:  04/08/18 219 lb (99.3 kg)  03/19/18 212 lb (96.2 kg)  03/14/18 217 lb 9.6 oz (98.7 kg)    Physical Exam Vitals signs and nursing note reviewed.  Constitutional:      Appearance: Normal appearance. She is not ill-appearing.  HENT:     Head: Atraumatic.     Comments: B/l middle ear effusion    Nose: Rhinorrhea present.     Mouth/Throat:     Mouth: Mucous membranes are moist.     Pharynx: Posterior oropharyngeal erythema present.  Eyes:     Extraocular Movements: Extraocular movements intact.     Conjunctiva/sclera: Conjunctivae normal.  Neck:     Musculoskeletal: Normal range of motion and neck supple.  Cardiovascular:     Rate and Rhythm: Normal rate and  regular rhythm.     Heart sounds: Normal heart sounds.  Pulmonary:     Effort: Pulmonary effort is normal.     Breath sounds: Normal breath sounds.  Musculoskeletal: Normal range of motion.  Skin:    General: Skin is warm and dry.  Neurological:     Mental Status: She is alert and oriented to person, place, and time.  Psychiatric:        Mood and Affect: Mood normal.        Thought Content: Thought content normal.        Judgment: Judgment normal.     Results for orders placed or performed in visit on 03/19/18  Microscopic Examination  Result Value Ref Range   WBC, UA 0-5 0 - 5 /hpf   RBC, UA None seen 0 - 2 /hpf   Epithelial Cells (non renal) 0-10 0 - 10 /hpf   Mucus, UA Present Not Estab.   Bacteria, UA Few None seen/Few   Yeast, UA Present (A) None seen  CBC with Differential/Platelet  Result Value Ref Range   WBC 3.8 3.4 - 10.8 x10E3/uL   RBC 4.87 3.77 - 5.28 x10E6/uL  Hemoglobin 13.9 11.1 - 15.9 g/dL   Hematocrit 03.0 09.2 - 46.6 %   MCV 87 79 - 97 fL   MCH 28.5 26.6 - 33.0 pg   MCHC 32.7 31.5 - 35.7 g/dL   RDW 33.0 07.6 - 22.6 %   Platelets 211 150 - 450 x10E3/uL   Neutrophils 46 Not Estab. %   Lymphs 42 Not Estab. %   Monocytes 10 Not Estab. %   Eos 1 Not Estab. %   Basos 1 Not Estab. %   Neutrophils Absolute 1.8 1.4 - 7.0 x10E3/uL   Lymphocytes Absolute 1.6 0.7 - 3.1 x10E3/uL   Monocytes Absolute 0.4 0.1 - 0.9 x10E3/uL   EOS (ABSOLUTE) 0.0 0.0 - 0.4 x10E3/uL   Basophils Absolute 0.0 0.0 - 0.2 x10E3/uL   Immature Granulocytes 0 Not Estab. %   Immature Grans (Abs) 0.0 0.0 - 0.1 x10E3/uL  Comprehensive metabolic panel  Result Value Ref Range   Glucose 76 65 - 99 mg/dL   BUN 18 6 - 20 mg/dL   Creatinine, Ser 3.33 (H) 0.57 - 1.00 mg/dL   GFR calc non Af Amer 72 >59 mL/min/1.73   GFR calc Af Amer 83 >59 mL/min/1.73   BUN/Creatinine Ratio 17 9 - 23   Sodium 143 134 - 144 mmol/L   Potassium 3.8 3.5 - 5.2 mmol/L   Chloride 106 96 - 106 mmol/L   CO2 23 20 -  29 mmol/L   Calcium 8.7 8.7 - 10.2 mg/dL   Total Protein 5.4 (L) 6.0 - 8.5 g/dL   Albumin 3.6 3.5 - 5.5 g/dL   Globulin, Total 1.8 1.5 - 4.5 g/dL   Albumin/Globulin Ratio 2.0 1.2 - 2.2   Bilirubin Total 0.3 0.0 - 1.2 mg/dL   Alkaline Phosphatase 72 39 - 117 IU/L   AST 20 0 - 40 IU/L   ALT 16 0 - 32 IU/L  Lipid Panel w/o Chol/HDL Ratio  Result Value Ref Range   Cholesterol, Total 154 100 - 199 mg/dL   Triglycerides 545 0 - 149 mg/dL   HDL 42 >62 mg/dL   VLDL Cholesterol Cal 20 5 - 40 mg/dL   LDL Calculated 92 0 - 99 mg/dL  TSH  Result Value Ref Range   TSH 1.820 0.450 - 4.500 uIU/mL  UA/M w/rflx Culture, Routine  Result Value Ref Range   Specific Gravity, UA 1.025 1.005 - 1.030   pH, UA 6.0 5.0 - 7.5   Color, UA Yellow Yellow   Appearance Ur Cloudy (A) Clear   Leukocytes, UA Trace (A) Negative   Protein, UA Trace (A) Negative/Trace   Glucose, UA Negative Negative   Ketones, UA Negative Negative   RBC, UA Trace (A) Negative   Bilirubin, UA Negative Negative   Urobilinogen, Ur 0.2 0.2 - 1.0 mg/dL   Nitrite, UA Negative Negative   Microscopic Examination See below:       Assessment & Plan:   Problem List Items Addressed This Visit    None    Visit Diagnoses    Right acute serous otitis media, recurrence not specified    -  Primary   Infection appears to be resolving, still having persistent fluid in ears. Flonase BID, sudafed, antihistamine. Complete abx. F/u if not better.        Follow up plan: Return for as scheduled.

## 2018-04-08 NOTE — Patient Instructions (Signed)
Sudafed every 6-8 hours, flonase twice daily, can add an antihistamine daily

## 2018-04-24 ENCOUNTER — Encounter: Payer: Self-pay | Admitting: Family Medicine

## 2018-04-24 ENCOUNTER — Ambulatory Visit (INDEPENDENT_AMBULATORY_CARE_PROVIDER_SITE_OTHER): Payer: Medicaid Other | Admitting: Family Medicine

## 2018-04-24 VITALS — BP 124/90 | HR 102 | Temp 97.7°F | Ht 66.0 in | Wt 215.0 lb

## 2018-04-24 DIAGNOSIS — J069 Acute upper respiratory infection, unspecified: Secondary | ICD-10-CM | POA: Diagnosis not present

## 2018-04-24 DIAGNOSIS — R6883 Chills (without fever): Secondary | ICD-10-CM

## 2018-04-24 DIAGNOSIS — J029 Acute pharyngitis, unspecified: Secondary | ICD-10-CM | POA: Diagnosis not present

## 2018-04-24 NOTE — Progress Notes (Signed)
BP 124/90 (BP Location: Left Arm, Patient Position: Sitting, Cuff Size: Normal)   Pulse (!) 102   Temp 97.7 F (36.5 C) (Oral)   Ht 5\' 6"  (1.676 m)   Wt 215 lb (97.5 kg)   SpO2 98%   BMI 34.70 kg/m    Subjective:    Patient ID: Allison Moreno, female    DOB: 11/07/1985, 33 y.o.   MRN: [id number]  HPI: Allison Moreno is a 33 y.o. female  Chief Complaint  Patient presents with  . Chills    Ongoing 3 days.  . Sore Throat  . Fever  . Cough  . Nasal Congestion   UPPER RESPIRATORY TRACT INFECTION Duration: 3 days Worst symptom: runny nose Fever: yes- 99 Cough: no Shortness of breath: no Wheezing: yes Chest pain: no Chest tightness: no Chest congestion: yes Nasal congestion: yes Runny nose: yes Post nasal drip: yes Sneezing: yes Sore throat: yes Swollen glands: no Sinus pressure: no Headache: yes Face pain: no Toothache: no Ear pain: yes bilateral Ear pressure: yes bilateral Eyes red/itching:no Eye drainage/crusting: no  Vomiting: no Rash: no Fatigue: yes Sick contacts: yes- daughter just had RSV Strep contacts: no  Context: worse Recurrent sinusitis: no Relief with OTC cold/cough medications: no  Treatments attempted: cold/sinus, anti-histamine and pseudoephedrine    Relevant past medical, surgical, family and social history reviewed and updated as indicated. Interim medical history since our last visit reviewed. Allergies and medications reviewed and updated.  Review of Systems  Constitutional: Positive for chills, diaphoresis, fatigue and fever. Negative for activity change, appetite change and unexpected weight change.  HENT: Positive for congestion, postnasal drip, rhinorrhea, sinus pressure and sneezing. Negative for dental problem, drooling, ear discharge, ear pain, facial swelling, hearing loss, mouth sores, nosebleeds, sinus pain, sore throat, tinnitus, trouble swallowing and voice change.   Eyes: Negative.   Respiratory: Positive for cough  and wheezing. Negative for apnea, choking, chest tightness, shortness of breath and stridor.   Cardiovascular: Negative.   Psychiatric/Behavioral: Negative.     Per HPI unless specifically indicated above     Objective:    BP 124/90 (BP Location: Left Arm, Patient Position: Sitting, Cuff Size: Normal)   Pulse (!) 102   Temp 97.7 F (36.5 C) (Oral)   Ht 5\' 6"  (1.676 m)   Wt 215 lb (97.5 kg)   SpO2 98%   BMI 34.70 kg/m   Wt Readings from Last 3 Encounters:  04/24/18 215 lb (97.5 kg)  04/08/18 219 lb (99.3 kg)  03/19/18 212 lb (96.2 kg)    Physical Exam Vitals signs and nursing note reviewed.  Constitutional:      General: She is not in acute distress.    Appearance: Normal appearance. She is not ill-appearing, toxic-appearing or diaphoretic.  HENT:     Head: Normocephalic and atraumatic.     Right Ear: Tympanic membrane, ear canal and external ear normal. There is no impacted cerumen.     Left Ear: Tympanic membrane, ear canal and external ear normal. There is no impacted cerumen.     Nose: Congestion and rhinorrhea present.     Mouth/Throat:     Mouth: Mucous membranes are moist.     Pharynx: Oropharynx is clear. No oropharyngeal exudate or posterior oropharyngeal erythema.  Eyes:     General: No scleral icterus.       Right eye: No discharge.        Left eye: No discharge.     Extraocular Movements: Extraocular  movements intact.     Conjunctiva/sclera: Conjunctivae normal.     Pupils: Pupils are equal, round, and reactive to light.  Neck:     Musculoskeletal: Normal range of motion and neck supple. No neck rigidity or muscular tenderness.     Vascular: No carotid bruit.  Cardiovascular:     Rate and Rhythm: Normal rate and regular rhythm.     Pulses: Normal pulses.     Heart sounds: Normal heart sounds. No murmur. No friction rub. No gallop.   Pulmonary:     Effort: Pulmonary effort is normal. No respiratory distress.     Breath sounds: Normal breath sounds. No  stridor. No wheezing, rhonchi or rales.  Chest:     Chest wall: No tenderness.  Musculoskeletal: Normal range of motion.  Lymphadenopathy:     Cervical: Cervical adenopathy present.  Skin:    General: Skin is warm and dry.     Capillary Refill: Capillary refill takes less than 2 seconds.     Coloration: Skin is not jaundiced or pale.     Findings: No bruising, erythema, lesion or rash.  Neurological:     General: No focal deficit present.     Mental Status: She is alert and oriented to person, place, and time. Mental status is at baseline.     Cranial Nerves: No cranial nerve deficit.     Sensory: No sensory deficit.     Motor: No weakness.     Coordination: Coordination normal.     Gait: Gait normal.     Deep Tendon Reflexes: Reflexes normal.  Psychiatric:        Mood and Affect: Mood normal.        Behavior: Behavior normal.        Thought Content: Thought content normal.        Judgment: Judgment normal.     Results for orders placed or performed in visit on 03/19/18  Microscopic Examination  Result Value Ref Range   WBC, UA 0-5 0 - 5 /hpf   RBC, UA None seen 0 - 2 /hpf   Epithelial Cells (non renal) 0-10 0 - 10 /hpf   Mucus, UA Present Not Estab.   Bacteria, UA Few None seen/Few   Yeast, UA Present (A) None seen  CBC with Differential/Platelet  Result Value Ref Range   WBC 3.8 3.4 - 10.8 x10E3/uL   RBC 4.87 3.77 - 5.28 x10E6/uL   Hemoglobin 13.9 11.1 - 15.9 g/dL   Hematocrit 40.942.5 81.134.0 - 46.6 %   MCV 87 79 - 97 fL   MCH 28.5 26.6 - 33.0 pg   MCHC 32.7 31.5 - 35.7 g/dL   RDW 91.412.5 78.212.3 - 95.615.4 %   Platelets 211 150 - 450 x10E3/uL   Neutrophils 46 Not Estab. %   Lymphs 42 Not Estab. %   Monocytes 10 Not Estab. %   Eos 1 Not Estab. %   Basos 1 Not Estab. %   Neutrophils Absolute 1.8 1.4 - 7.0 x10E3/uL   Lymphocytes Absolute 1.6 0.7 - 3.1 x10E3/uL   Monocytes Absolute 0.4 0.1 - 0.9 x10E3/uL   EOS (ABSOLUTE) 0.0 0.0 - 0.4 x10E3/uL   Basophils Absolute 0.0 0.0 -  0.2 x10E3/uL   Immature Granulocytes 0 Not Estab. %   Immature Grans (Abs) 0.0 0.0 - 0.1 x10E3/uL  Comprehensive metabolic panel  Result Value Ref Range   Glucose 76 65 - 99 mg/dL   BUN 18 6 - 20 mg/dL   Creatinine, Ser  1.03 (H) 0.57 - 1.00 mg/dL   GFR calc non Af Amer 72 >59 mL/min/1.73   GFR calc Af Amer 83 >59 mL/min/1.73   BUN/Creatinine Ratio 17 9 - 23   Sodium 143 134 - 144 mmol/L   Potassium 3.8 3.5 - 5.2 mmol/L   Chloride 106 96 - 106 mmol/L   CO2 23 20 - 29 mmol/L   Calcium 8.7 8.7 - 10.2 mg/dL   Total Protein 5.4 (L) 6.0 - 8.5 g/dL   Albumin 3.6 3.5 - 5.5 g/dL   Globulin, Total 1.8 1.5 - 4.5 g/dL   Albumin/Globulin Ratio 2.0 1.2 - 2.2   Bilirubin Total 0.3 0.0 - 1.2 mg/dL   Alkaline Phosphatase 72 39 - 117 IU/L   AST 20 0 - 40 IU/L   ALT 16 0 - 32 IU/L  Lipid Panel w/o Chol/HDL Ratio  Result Value Ref Range   Cholesterol, Total 154 100 - 199 mg/dL   Triglycerides 974 0 - 149 mg/dL   HDL 42 >16 mg/dL   VLDL Cholesterol Cal 20 5 - 40 mg/dL   LDL Calculated 92 0 - 99 mg/dL  TSH  Result Value Ref Range   TSH 1.820 0.450 - 4.500 uIU/mL  UA/M w/rflx Culture, Routine  Result Value Ref Range   Specific Gravity, UA 1.025 1.005 - 1.030   pH, UA 6.0 5.0 - 7.5   Color, UA Yellow Yellow   Appearance Ur Cloudy (A) Clear   Leukocytes, UA Trace (A) Negative   Protein, UA Trace (A) Negative/Trace   Glucose, UA Negative Negative   Ketones, UA Negative Negative   RBC, UA Trace (A) Negative   Bilirubin, UA Negative Negative   Urobilinogen, Ur 0.2 0.2 - 1.0 mg/dL   Nitrite, UA Negative Negative   Microscopic Examination See below:       Assessment & Plan:   Problem List Items Addressed This Visit    None    Visit Diagnoses    Upper respiratory tract infection, unspecified type    -  Primary   Flu and strep negative. Will treat symptomatically. Call if not getting better or getting wose.    Chills       Relevant Orders   Rapid Strep Screen ([facility] ONLY)    Veritor Flu A/B Waived   Sore throat       Relevant Orders   Rapid Strep Screen ([facility] ONLY)   Veritor Flu A/B Waived       Follow up plan: Return if symptoms worsen or fail to improve.

## 2018-05-05 LAB — VERITOR FLU A/B WAIVED
INFLUENZA B: NEGATIVE
Influenza A: NEGATIVE

## 2018-05-05 LAB — RAPID STREP SCREEN (MED CTR MEBANE ONLY): Strep Gp A Ag, IA W/Reflex: NEGATIVE

## 2018-05-05 LAB — CULTURE, GROUP A STREP: Strep A Culture: NEGATIVE

## 2018-06-09 ENCOUNTER — Telehealth: Payer: Self-pay | Admitting: Family Medicine

## 2018-06-09 NOTE — Telephone Encounter (Signed)
I will do my best to get them back to her same day but it is dependent on what type of forms she has and how extensive they are  Copied from CRM [id number]. Topic: General - Other >> Jun 09, 2018 10:06 AM Elliot Gault wrote: Patient scheduled with Roosvelt Maser for 06/11/2018 30 minute appoitment, requesting annual form be filled out at the time of appointment. Advised patient turn around time 5 to 7 business day patient was admant about form being filled out at the time of appointment stating its for a new job start date Thursday 06/12/2018. Advised patient to drop paperwork off today due to patient last cpe being 06/11/2018 patient unable to drop off forms

## 2018-06-10 NOTE — Telephone Encounter (Signed)
Spoke with pt and she stated that the form was a basic form for school. She stated that she would be here early so we could get the form back to the clinical staff.

## 2018-06-11 ENCOUNTER — Encounter: Payer: Self-pay | Admitting: Family Medicine

## 2018-06-11 ENCOUNTER — Ambulatory Visit (INDEPENDENT_AMBULATORY_CARE_PROVIDER_SITE_OTHER): Payer: Medicaid Other | Admitting: Family Medicine

## 2018-06-11 ENCOUNTER — Other Ambulatory Visit: Payer: Self-pay

## 2018-06-11 VITALS — BP 131/92 | HR 87 | Ht 66.0 in | Wt 216.0 lb

## 2018-06-11 DIAGNOSIS — F419 Anxiety disorder, unspecified: Secondary | ICD-10-CM

## 2018-06-11 NOTE — Progress Notes (Signed)
BP (!) 131/92   Pulse 87   Ht 5\' 6"  (1.676 m)   Wt 216 lb (98 kg)   SpO2 96%   BMI 34.86 kg/m    Subjective:    Patient ID: Allison Moreno, female    DOB: [birth year], 33 y.o.   MRN: [id number]  HPI: Allison Moreno is a 33 y.o. female  Chief Complaint  Patient presents with  . 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 Does have a known hx of ADD, anxiety and depression for which she is currently doing twice weekly counseling which seems to help. Declines medication at this time because she doesn't want to risk being sedated to any degree and feeling stable without. Waiting to get in with Psychiatry to manage these conditions once her new insurance kicks in. No severe mood swings, SI/HI, sleep or appetite issues.   Relevant past medical, surgical, family and social history reviewed and updated as indicated. Interim medical history since our last visit reviewed. Allergies and medications reviewed and updated.  Review of Systems  Per HPI unless specifically indicated above     Objective:    BP (!) 131/92   Pulse 87   Ht 5\' 6"  (1.676 m)   Wt 216 lb (98 kg)   SpO2 96%   BMI 34.86 kg/m   Wt Readings from Last 3 Encounters:  06/11/18 216 lb (98 kg)  04/24/18 215 lb (97.5 kg)  04/08/18 219 lb (99.3 kg)    Physical Exam Vitals signs and nursing note reviewed.  Constitutional:      Appearance: Normal appearance. She is not ill-appearing.  HENT:     Head: Atraumatic.  Eyes:     Extraocular Movements: Extraocular movements intact.     Conjunctiva/sclera: Conjunctivae normal.  Neck:     Musculoskeletal: Normal range of motion and neck supple.  Cardiovascular:     Rate and Rhythm: Normal rate and regular rhythm.     Heart sounds: Normal heart sounds.  Pulmonary:     Effort: Pulmonary effort is normal.     Breath sounds: Normal breath sounds.  Musculoskeletal: Normal range of motion.  Skin:  General: Skin is warm and dry.  Neurological:     Mental Status: She is alert and oriented to person, place, and time.  Psychiatric:        Mood and Affect: Mood normal.        Thought Content: Thought content normal.        Judgment: Judgment normal.     Results for orders placed or performed in visit on 04/24/18  Rapid Strep Screen ([facility] ONLY)  Result Value Ref Range   Strep Gp A Ag, IA W/Reflex Negative Negative  Culture, Group A Strep  Result Value Ref Range   Strep A Culture Negative   Veritor Flu A/B Waived  Result Value Ref Range   Influenza A Negative Negative   Influenza B Negative Negative      Assessment & Plan:   Problem List Items Addressed This Visit      Other   Anxiety - Primary    Continue working with counseling, await getting in with Psychiatry. Recommended she try the buspar if things are starting to get worse at any time or to call and come back in. Form completed and scanned into chart.           Follow up plan: Return  for CPE.

## 2018-06-11 NOTE — Assessment & Plan Note (Signed)
Continue working with counseling, await getting in with Psychiatry. Recommended she try the buspar if things are starting to get worse at any time or to call and come back in. Form completed and scanned into chart.

## 2018-07-02 DIAGNOSIS — M25572 Pain in left ankle and joints of left foot: Secondary | ICD-10-CM | POA: Diagnosis not present

## 2018-07-14 ENCOUNTER — Encounter: Payer: Self-pay | Admitting: Family Medicine

## 2018-07-14 NOTE — Telephone Encounter (Signed)
Patient declined appointment. She said she is going to try some OTC today and call us back if she does not improve in the next day or two.  Just FYI  Thank you

## 2018-07-14 NOTE — Telephone Encounter (Signed)
Please get her scheduled for virtual visit and let her know she can drop a urine off afterward to be tested

## 2018-07-28 DIAGNOSIS — R300 Dysuria: Secondary | ICD-10-CM | POA: Diagnosis not present

## 2018-08-05 DIAGNOSIS — F331 Major depressive disorder, recurrent, moderate: Secondary | ICD-10-CM | POA: Diagnosis not present

## 2018-08-05 DIAGNOSIS — F411 Generalized anxiety disorder: Secondary | ICD-10-CM | POA: Diagnosis not present

## 2018-08-05 DIAGNOSIS — F900 Attention-deficit hyperactivity disorder, predominantly inattentive type: Secondary | ICD-10-CM | POA: Diagnosis not present

## 2018-08-12 DIAGNOSIS — F900 Attention-deficit hyperactivity disorder, predominantly inattentive type: Secondary | ICD-10-CM | POA: Diagnosis not present

## 2018-08-12 DIAGNOSIS — F411 Generalized anxiety disorder: Secondary | ICD-10-CM | POA: Diagnosis not present

## 2018-08-12 DIAGNOSIS — F331 Major depressive disorder, recurrent, moderate: Secondary | ICD-10-CM | POA: Diagnosis not present

## 2018-08-26 DIAGNOSIS — F411 Generalized anxiety disorder: Secondary | ICD-10-CM | POA: Diagnosis not present

## 2018-08-26 DIAGNOSIS — F339 Major depressive disorder, recurrent, unspecified: Secondary | ICD-10-CM | POA: Diagnosis not present

## 2018-09-19 DIAGNOSIS — F411 Generalized anxiety disorder: Secondary | ICD-10-CM | POA: Diagnosis not present

## 2018-09-25 ENCOUNTER — Other Ambulatory Visit: Payer: Self-pay

## 2018-09-25 ENCOUNTER — Encounter: Payer: Self-pay | Admitting: Obstetrics and Gynecology

## 2018-09-25 ENCOUNTER — Ambulatory Visit (INDEPENDENT_AMBULATORY_CARE_PROVIDER_SITE_OTHER): Payer: BC Managed Care – PPO | Admitting: Obstetrics and Gynecology

## 2018-09-25 VITALS — BP 122/91 | HR 89 | Wt 222.9 lb

## 2018-09-25 DIAGNOSIS — Z3009 Encounter for other general counseling and advice on contraception: Secondary | ICD-10-CM

## 2018-09-25 NOTE — Progress Notes (Signed)
HPI:      [name] is a 33 y.o. G1P1001 who LMP was Patient's last menstrual period was 09/11/2018.  Subjective:   She presents today to discuss birth control options.  She was initially thinking that she wanted a tubal ligation but has changed her mind because she would like to have another child in the future.    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 She does not have any pertinent problems on file. She  has a past surgical history that includes Tympanoplasty (Right); Tonsillectomy; Wisdom tooth extraction; Appendectomy (N/A, 09/28/2016); and Cesarean section (N/A, 11/01/2016). Her family history includes Depression in her mother; Diabetes in her father and paternal grandfather. She was adopted. She  reports that she has never smoked. She has never used smokeless tobacco. She reports that she does not drink alcohol or use drugs. She has a current medication list which includes the following prescription(s): amphetamine-dextroamphetamine, buspirone, desvenlafaxine, hydroxyzine, pseudoephedrine, and temazepam. She is allergic to lisdexamfetamine and sulfa antibiotics.       Review of Systems:  Review of Systems  Constitutional: Denied constitutional symptoms, night sweats, recent illness, fatigue, fever, insomnia and weight loss.  Eyes: Denied eye symptoms, eye pain, photophobia, vision change and visual disturbance.  Ears/Nose/Throat/Neck: Denied ear, nose, throat or neck symptoms, hearing loss, nasal discharge, sinus congestion and sore throat.   Cardiovascular: Denied cardiovascular symptoms, arrhythmia, chest pain/pressure, edema, exercise intolerance, orthopnea and palpitations.  Respiratory: Denied pulmonary symptoms, asthma, pleuritic pain, productive sputum, cough, dyspnea and wheezing.  Gastrointestinal: Denied, gastro-esophageal reflux, melena, nausea and vomiting.  Genitourinary: Denied genitourinary symptoms including symptomatic vaginal discharge, pelvic relaxation issues, and urinary complaints.  Musculoskeletal: Denied musculoskeletal symptoms, stiffness, swelling, muscle weakness and myalgia.  Dermatologic: Denied dermatology symptoms, rash and scar.  Neurologic: Denied neurology symptoms, dizziness, headache, neck pain and syncope.  Psychiatric: Denied psychiatric symptoms, anxiety and depression.  Endocrine: Denied endocrine symptoms including hot flashes and night sweats.   Meds:   Current Outpatient Medications on File Prior to Visit  Medication Sig Dispense Refill  . amphetamine-dextroamphetamine (ADDERALL) 15 MG tablet Take 15 mg by mouth daily as needed.    . busPIRone (BUSPAR) 10 MG tablet Take 1 tablet (10 mg total) by mouth 2 (two) times daily. (Patient not taking: Reported on 09/25/2018) 60 tablet 0  . desvenlafaxine (PRISTIQ) 50 MG 24 hr tablet     . hydrOXYzine (ATARAX/VISTARIL) 25 MG tablet Take 1 tablet (25 mg total) by mouth 3 (three) times daily as needed. (Patient not taking: Reported on 06/11/2018) 90 tablet 0  . pseudoephedrine (SUDAFED 12 HOUR) 120 MG 12 hr tablet Take 1 tablet (120 mg total) by mouth 2 (two) times daily. (Patient not taking: Reported on 09/25/2018) 14 tablet 0  . temazepam (RESTORIL) 7.5 MG capsule      No current facility-administered medications on file prior to visit.     Objective:     Vitals:   09/25/18 0949  BP: (!) 122/91  Pulse: 89                Assessment:  G1P1001 Patient Active Problem List   Diagnosis Date Noted  . Anxiety 03/14/2018  . Increased BMI  07/09/2017  . Adopted 05/03/2016  . Adjustment disorder with anxious mood 12/21/2014  . 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 Birth control counseling        Plan:            1.  Birth Control I discussed multiple birth control options and methods with the patient.  The risks and benefits of each were reviewed. IUD Literature on Mirena given.  Risks and benefits discussed.  She is considering IUD as an option for birth/cycle control. She has chosen Mirena IUD for birth control and will call with her next menstrual. We have discussed her need for annual examination and she is considering having this performed at her IUD checkup 4 weeks after insertion. Orders No orders of the defined types were placed in this encounter.   No orders of the defined types were placed in this encounter.     F/U  No follow-ups on file. I spent 18 minutes involved in the care of this patient of which greater than 50% was spent discussing birth control methods risks benefits of each method, patient's previous experience with birth control, permanent sterilization, future pregnancy.  All questions answered.  Elonda Huskyavid J. Marilyn Wing, M.D. 09/25/2018 10:09 AM

## 2018-10-08 ENCOUNTER — Encounter: Payer: BC Managed Care – PPO | Admitting: Obstetrics and Gynecology

## 2018-11-03 DIAGNOSIS — F900 Attention-deficit hyperactivity disorder, predominantly inattentive type: Secondary | ICD-10-CM | POA: Diagnosis not present

## 2018-11-03 DIAGNOSIS — F339 Major depressive disorder, recurrent, unspecified: Secondary | ICD-10-CM | POA: Diagnosis not present

## 2018-11-03 DIAGNOSIS — F411 Generalized anxiety disorder: Secondary | ICD-10-CM | POA: Diagnosis not present

## 2018-11-05 DIAGNOSIS — F339 Major depressive disorder, recurrent, unspecified: Secondary | ICD-10-CM | POA: Diagnosis not present

## 2018-11-05 DIAGNOSIS — F900 Attention-deficit hyperactivity disorder, predominantly inattentive type: Secondary | ICD-10-CM | POA: Diagnosis not present

## 2018-11-05 DIAGNOSIS — F411 Generalized anxiety disorder: Secondary | ICD-10-CM | POA: Diagnosis not present

## 2018-11-10 ENCOUNTER — Ambulatory Visit: Payer: BC Managed Care – PPO | Admitting: Family Medicine

## 2018-11-20 ENCOUNTER — Emergency Department
Admission: EM | Admit: 2018-11-20 | Discharge: 2018-11-20 | Disposition: A | Discharge status: Home / Self Care | Payer: BC Managed Care – PPO | Attending: Emergency Medicine | Admitting: Emergency Medicine

## 2018-11-20 ENCOUNTER — Encounter: Payer: Self-pay | Admitting: Emergency Medicine

## 2018-11-20 ENCOUNTER — Encounter: Payer: Self-pay | Admitting: Family Medicine

## 2018-11-20 ENCOUNTER — Emergency Department: Payer: BC Managed Care – PPO

## 2018-11-20 ENCOUNTER — Other Ambulatory Visit: Payer: Self-pay

## 2018-11-20 DIAGNOSIS — Z79899 Other long term (current) drug therapy: Secondary | ICD-10-CM | POA: Diagnosis not present

## 2018-11-20 DIAGNOSIS — R110 Nausea: Secondary | ICD-10-CM | POA: Insufficient documentation

## 2018-11-20 DIAGNOSIS — R519 Headache, unspecified: Secondary | ICD-10-CM

## 2018-11-20 DIAGNOSIS — R51 Headache: Secondary | ICD-10-CM | POA: Insufficient documentation

## 2018-11-20 LAB — POC URINE PREG, ED: Preg Test, Ur: NEGATIVE

## 2018-11-20 MED ORDER — ONDANSETRON 4 MG PO TBDP
ORAL_TABLET | ORAL | Status: AC
  Administered 2018-11-20: 4 mg via ORAL
  Filled 2018-11-20: qty 1

## 2018-11-20 MED ORDER — ONDANSETRON 4 MG PO TBDP: 4.0000 mg | ORAL_TABLET | Freq: Three times a day (TID) | ORAL | 0 refills | Status: AC | PRN

## 2018-11-20 MED ORDER — ONDANSETRON 4 MG PO TBDP
4.0000 mg | ORAL_TABLET | Freq: Once | ORAL | Status: AC
  Administered 2018-11-20: 15:00:00 4 mg via ORAL

## 2018-11-20 MED ORDER — BUTALBITAL-APAP-CAFFEINE 50-325-40 MG PO TABS: 1.0000 | ORAL_TABLET | Freq: Four times a day (QID) | ORAL | 0 refills | Status: AC | PRN

## 2018-11-20 NOTE — ED Provider Notes (Signed)
[facility] Emergency Department Provider Note   ____________________________________________   First MD Initiated Contact with Patient 11/20/18 1404     (approximate)  I have reviewed the triage vital signs and the nursing notes.   HISTORY  Chief Complaint Migraine    HPI Allison Moreno is a 33 y.o. female presents to the ED with complaint of a bad headache that is been going on for the last 3 days.  Patient denies any injury.  She states that she was restarted on her Adderall and began having nausea and bad headaches.  Patient took ibuprofen 800 mg today without any relief.  She had a tele-visit with her doctor who told her that the Adderall could cause a "brain bleed" and to come to the emergency department.  Patient reports that her headache is frontal bilaterally without any visual changes.  She rates her pain as an 8 out of 10.     Past Medical History:  Diagnosis Date  . ADD (attention deficit disorder)   . ADHD   . Anxiety   . Appendicitis 09/28/2016  . Chronic ear infection   . Depression   . 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 Neuromuscular disorder (HCC)   . Preeclampsia, third trimester 10/30/2016  . Pregnancy induced hypertension   . 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   7/25: 31%, 2555gm, AC 10%, BPP 8/8, normal UA dopplers, normal AFI, cephalic  MFM recs 39wk delivery, no need for further AP testing.     Patient Active Problem List   Diagnosis Date Noted  . Anxiety 03/14/2018  . Increased BMI 07/09/2017  . Adopted 05/03/2016  . Adjustment disorder with anxious mood 12/21/2014  . 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 APPENDECTOMY N/A 09/28/2016   Procedure: APPENDECTOMY;  Surgeon: Avel Peaceosenbower, Todd, MD;  Location: [facility] ORS;  Service: General;  Laterality: N/A;  . CESAREAN SECTION N/A  11/01/2016   Procedure: CESAREAN SECTION;  Surgeon: Hockessin BingPickens, Charlie, MD;  [facility];  Service: Obstetrics;  Laterality: N/A;  . TONSILLECTOMY    . TYMPANOPLASTY Right   . WISDOM TOOTH EXTRACTION      Prior to Admission medications   Medication Sig Start Date End Date Taking? Authorizing Provider  amphetamine-dextroamphetamine (ADDERALL) 15 MG tablet Take 15 mg by mouth daily as needed.    [provider]  busPIRone (BUSPAR) 10 MG tablet Take 1 tablet (10 mg total) by mouth 2 (two) times daily. Patient not taking: Reported on 09/25/2018 03/14/18   Particia NearingLane, Rachel Elizabeth, PA-C  butalbital-acetaminophen-caffeine [facility](FIORICET) [phone] MG tablet Take 1-2 tablets by mouth every 6 (six) hours as needed for headache. 11/20/18 11/20/19  Tommi RumpsSummers,  L, PA-C  desvenlafaxine (PRISTIQ) 50 MG 24 hr tablet  09/19/18   [provider]  hydrOXYzine (ATARAX/VISTARIL) 25 MG tablet Take 1 tablet (25 mg total) by mouth 3 (three) times daily as needed. Patient not taking: Reported on 06/11/2018 03/14/18   Particia NearingLane, Rachel Elizabeth, PA-C  ondansetron [facility](ZOFRAN ODT) 4 MG disintegrating tablet Take 1 tablet (4 mg total) by mouth every 8 (eight) hours as needed for nausea or vomiting. 11/20/18   Tommi RumpsSummers,  L, PA-C  pseudoephedrine (SUDAFED 12 HOUR) 120 MG 12 hr tablet Take 1 tablet (120 mg total) by mouth 2 (two) times daily. Patient not taking: Reported on 09/25/2018 04/08/18   Roosvelt MaserLane, Rachel  Lanora ManisElizabeth, PA-C  temazepam (RESTORIL) 7.5 MG capsule  09/19/18   [provider]    Allergies Lisdexamfetamine and Sulfa antibiotics  Family History  Adopted: Yes  Problem Relation Age of Onset  . Depression Mother   . Diabetes Father   . Diabetes Paternal Grandfather   . Breast cancer Neg Hx   . Ovarian cancer Neg Hx   . Colon cancer Neg Hx     Social History Social History   Tobacco Use  . Smoking status: Never Smoker  . Smokeless tobacco: Never Used  Substance Use Topics   . Alcohol use: No    Alcohol/week: 0.0 standard drinks  . Drug use: No    Review of Systems Constitutional: No fever/chills Eyes: No visual changes. ENT: No sore throat. Cardiovascular: Denies chest pain. Respiratory: Denies shortness of breath. Gastrointestinal: No abdominal pain.  Positive nausea, no vomiting.  Genitourinary: Negative for dysuria. Musculoskeletal: Negative for back pain. Skin: Negative for rash. Neurological: Positive for headaches, negative for focal weakness or numbness. ____________________________________________   PHYSICAL EXAM:  VITAL SIGNS: ED Triage Vitals  Enc Vitals Group     BP 11/20/18 1321 (!) 128/96     Pulse Rate 11/20/18 1321 80     Resp 11/20/18 1321 20     Temp 11/20/18 1321 98.6 F (37 C)     Temp Source 11/20/18 1321 Oral     SpO2 11/20/18 1321 100 %     Weight --      Height --      Head Circumference --      Peak Flow --      Pain Score 11/20/18 1327 8     Pain Loc --      Pain Edu? --      Excl. in GC? --    Constitutional: Alert and oriented. Well appearing and in no acute distress.  No photophobia. Eyes: Conjunctivae are normal.  PERRLA, EOMI Head: Atraumatic. Nose: No congestion/rhinnorhea. Mouth/Throat: Mucous membranes are moist.  Oropharynx non-erythematous. Neck: No stridor.   Cardiovascular: Normal rate, regular rhythm. Grossly normal heart sounds.  Good peripheral circulation. Respiratory: Normal respiratory effort.  No retractions. Lungs CTAB. Gastrointestinal: Soft and nontender. No distention.  Musculoskeletal: Moves upper and lower extremities without difficulty.  Normal gait was noted. Neurologic:  Normal speech and language. No gross focal neurologic deficits are appreciated. No gait instability. Skin:  Skin is warm, dry and intact. No rash noted. Psychiatric: Mood and affect are normal. Speech and behavior are normal.  ____________________________________________   LABS (all labs ordered are listed,  but only abnormal results are displayed)  Labs Reviewed  POC URINE PREG, ED    RADIOLOGY   Official radiology report(s): Ct Head Wo Contrast  Result Date: 11/20/2018 CLINICAL DATA:  Acute severe headache worst of life EXAM: CT HEAD WITHOUT CONTRAST TECHNIQUE: Contiguous axial images were obtained from the base of the skull through the vertex without intravenous contrast. Sagittal and coronal MPR images reconstructed from axial data set. COMPARISON:  None FINDINGS: Brain: Normal ventricular morphology. No midline shift or mass effect. Normal appearance of brain parenchyma. No intracranial hemorrhage, mass lesion, evidence of acute infarction, or extra-axial fluid collection. Vascular: Normal appearance Skull: Intact, unremarkable Sinuses/Orbits: Clear Other: N/A IMPRESSION: Normal exam. Electronically Signed   By: Ulyses SouthwardMark  Boles M.D.   On: 11/20/2018 15:27    ____________________________________________   PROCEDURES  Procedure(s) performed (including Critical Care):  Procedures   ____________________________________________   INITIAL IMPRESSION / ASSESSMENT AND PLAN /  ED COURSE  As part of my medical decision making, I reviewed the following data within the electronic MEDICAL RECORD NUMBER Notes from prior ED visits and [place] Controlled Substance Database  33 year old female presents to the ED with complaint of frontal headache for the last 3 days.  Patient has had some nausea without vomiting.  She has been taking over-the-counter medication without relief.  Patient states that she was recently restarted on her Adderall per her PCP but discontinued it when she began having headaches.  With a E visit with her PCP she was told that she should come to the ED to rule out bleeding that would be causing her headache.  CT scan was done and patient was made aware that this was negative.  She was discharged with a prescription for Fioricet and Zofran.  Patient drove herself to the ED today and is aware  that she cannot take this medication until she is at home.   ____________________________________________   FINAL CLINICAL IMPRESSION(S) / ED DIAGNOSES  Final diagnoses:  Acute nonintractable headache, unspecified headache type     ED Discharge Orders         Ordered    butalbital-acetaminophen-caffeine (FIORICET) 50-325-40 MG tablet  Every 6 hours PRN     11/20/18 1544    ondansetron (ZOFRAN ODT) 4 MG disintegrating tablet  Every 8 hours PRN     11/20/18 1544           Note:  This document was prepared using Dragon voice recognition software and may include unintentional dictation errors.    Johnn Hai, PA-C 11/20/18 1616    Earleen Newport, MD 11/23/18 [phone]

## 2018-11-20 NOTE — ED Notes (Addendum)
Pt was told by tele dr that adderall can cause a brain bleed, so she is here because she is concerned.

## 2018-11-20 NOTE — ED Triage Notes (Addendum)
Pt here with c/o migraine for a few days now, recently went back on aderall,  however, after 3 days felt nauseated and had a bad headache, so she stopped it, took 800mg  ibuprofen around 1130 today with no relief. Describes pain as "throbbing."

## 2018-11-20 NOTE — Discharge Instructions (Signed)
Follow-up with your primary care provider if any continued problems.  Zofran if needed for nausea and vomiting.  Also Fioricet is for headache.  This medication could cause drowsiness and increase your risk for injury.  Do not drive or operate machinery while taking that medication.

## 2018-11-28 ENCOUNTER — Ambulatory Visit: Payer: BC Managed Care – PPO | Admitting: Family Medicine

## 2018-11-28 ENCOUNTER — Other Ambulatory Visit: Payer: Self-pay

## 2018-11-28 ENCOUNTER — Encounter: Payer: Self-pay | Admitting: Family Medicine

## 2018-11-28 VITALS — BP 112/81 | HR 97 | Temp 100.4°F | Ht 66.0 in | Wt 224.0 lb

## 2018-11-28 DIAGNOSIS — G43909 Migraine, unspecified, not intractable, without status migrainosus: Secondary | ICD-10-CM | POA: Diagnosis not present

## 2018-11-28 DIAGNOSIS — R51 Headache: Secondary | ICD-10-CM | POA: Diagnosis not present

## 2018-11-28 DIAGNOSIS — R519 Headache, unspecified: Secondary | ICD-10-CM

## 2018-11-28 DIAGNOSIS — R635 Abnormal weight gain: Secondary | ICD-10-CM

## 2018-11-28 DIAGNOSIS — R5383 Other fatigue: Secondary | ICD-10-CM

## 2018-11-28 DIAGNOSIS — Z23 Encounter for immunization: Secondary | ICD-10-CM

## 2018-11-28 DIAGNOSIS — Z20822 Contact with and (suspected) exposure to covid-19: Secondary | ICD-10-CM

## 2018-11-28 MED ORDER — SUMATRIPTAN SUCCINATE 50 MG PO TABS: ORAL_TABLET | ORAL | 0 refills | Status: AC

## 2018-11-28 MED ORDER — FLUTICASONE PROPIONATE 50 MCG/ACT NA SUSP: 2.0000 | Freq: Two times a day (BID) | NASAL | 6 refills | Status: AC

## 2018-11-28 MED ORDER — KETOROLAC TROMETHAMINE 60 MG/2ML IM SOLN
60.0000 mg | Freq: Once | INTRAMUSCULAR | Status: AC
Start: 2018-11-28 — End: 2018-11-28
  Administered 2018-11-28: 60 mg via INTRAMUSCULAR

## 2018-11-28 NOTE — Progress Notes (Signed)
BP 112/81   Pulse 97   Temp (!) 100.4 F (38 C) (Oral)   Ht 5\' 6"  (1.676 m)   Wt 224 lb (101.6 kg)   LMP 11/04/2018 (Approximate)   SpO2 98%   BMI 36.15 kg/m    Subjective:    Patient ID: Allison Moreno, female    DOB: 11/05/1985, 33 y.o.   MRN: [id number]  HPI: Allison Moreno is a 33 y.o. female  Chief Complaint  Patient presents with  . Headache    patient would like to have blood work done to check her thyroids  . Fatigue  . Hot Flashes  . Weight Gain   Severe migraine started in the middle of the night, voice hoarseness, nausea, fatigue, sweats ongoing the past few weeks. Temps have been high 99 range. Has not usually been someone who suffers from migraines in the past. Taking OTC pain relievers without benefit.   Insomnia - seeing Psychiatry for anxiety and insomnia. Stopped her restoril because it wasn't helping at all. Stopped the pristiq because it wasn't helping. Adderall caused her to feel palpitations and like she was going to pass out. She thought this was what caused her funny sxs she started having but they have persisted for several weeks since stopping the medicines.   Also concerned about her recent weight gain. No diet or activity changes.   Relevant past medical, surgical, family and social history reviewed and updated as indicated. Interim medical history since our last visit reviewed. Allergies and medications reviewed and updated.  Review of Systems  Per HPI unless specifically indicated above     Objective:    BP 112/81   Pulse 97   Temp (!) 100.4 F (38 C) (Oral)   Ht 5\' 6"  (1.676 m)   Wt 224 lb (101.6 kg)   LMP 11/04/2018 (Approximate)   SpO2 98%   BMI 36.15 kg/m   Wt Readings from Last 3 Encounters:  11/28/18 224 lb (101.6 kg)  09/25/18 222 lb 14.4 oz (101.1 kg)  06/11/18 216 lb (98 kg)    Physical Exam Vitals signs and nursing note reviewed.  Constitutional:      Appearance: Normal appearance. She is not ill-appearing.   HENT:     Head: Atraumatic.  Eyes:     Extraocular Movements: Extraocular movements intact.     Conjunctiva/sclera: Conjunctivae normal.  Neck:     Musculoskeletal: Normal range of motion and neck supple.  Cardiovascular:     Rate and Rhythm: Normal rate and regular rhythm.     Heart sounds: Normal heart sounds.  Pulmonary:     Effort: Pulmonary effort is normal.     Breath sounds: Normal breath sounds.  Abdominal:     General: Bowel sounds are normal.     Palpations: Abdomen is soft. There is no mass.     Tenderness: There is no abdominal tenderness. There is no guarding.  Musculoskeletal: Normal range of motion.  Skin:    General: Skin is warm and dry.  Neurological:     General: No focal deficit present.     Mental Status: She is alert and oriented to person, place, and time.     Motor: No weakness.     Gait: Gait normal.  Psychiatric:        Mood and Affect: Mood normal.        Thought Content: Thought content normal.        Judgment: Judgment normal.     Results  for orders placed or performed in visit on 11/28/18  TSH  Result Value Ref Range   TSH 2.410 0.450 - 4.500 uIU/mL  CBC with Differential/Platelet  Result Value Ref Range   WBC 4.5 3.4 - 10.8 x10E3/uL   RBC 4.64 3.77 - 5.28 x10E6/uL   Hemoglobin 13.7 11.1 - 15.9 g/dL   Hematocrit 40.2 34.0 - 46.6 %   MCV 87 79 - 97 fL   MCH 29.5 26.6 - 33.0 pg   MCHC 34.1 31.5 - 35.7 g/dL   RDW 12.8 11.7 - 15.4 %   Platelets 198 150 - 450 x10E3/uL   Neutrophils 53 Not Estab. %   Lymphs 35 Not Estab. %   Monocytes 11 Not Estab. %   Eos 1 Not Estab. %   Basos 0 Not Estab. %   Neutrophils Absolute 2.4 1.4 - 7.0 x10E3/uL   Lymphocytes Absolute 1.6 0.7 - 3.1 x10E3/uL   Monocytes Absolute 0.5 0.1 - 0.9 x10E3/uL   EOS (ABSOLUTE) 0.0 0.0 - 0.4 x10E3/uL   Basophils Absolute 0.0 0.0 - 0.2 x10E3/uL   Immature Granulocytes 0 Not Estab. %   Immature Grans (Abs) 0.0 0.0 - 0.1 x10E3/uL  Comprehensive metabolic panel  Result  Value Ref Range   Glucose 89 65 - 99 mg/dL   BUN 17 6 - 20 mg/dL   Creatinine, Ser 0.87 0.57 - 1.00 mg/dL   GFR calc non Af Amer 88 >59 mL/min/1.73   GFR calc Af Amer 102 >59 mL/min/1.73   BUN/Creatinine Ratio 20 9 - 23   Sodium 141 134 - 144 mmol/L   Potassium 4.0 3.5 - 5.2 mmol/L   Chloride 106 96 - 106 mmol/L   CO2 23 20 - 29 mmol/L   Calcium 8.8 8.7 - 10.2 mg/dL   Total Protein 5.5 (L) 6.0 - 8.5 g/dL   Albumin 3.7 (L) 3.8 - 4.8 g/dL   Globulin, Total 1.8 1.5 - 4.5 g/dL   Albumin/Globulin Ratio 2.1 1.2 - 2.2   Bilirubin Total 0.3 0.0 - 1.2 mg/dL   Alkaline Phosphatase 69 39 - 117 IU/L   AST 25 0 - 40 IU/L   ALT 20 0 - 32 IU/L      Assessment & Plan:   Problem List Items Addressed This Visit    None    Visit Diagnoses    Fatigue, unspecified type    -  Primary   Will refer for COVID 19 testing and run basic labs, including CBC and TSH. Supportive care and return precautions given. F/u if not improving   Relevant Orders   TSH (Completed)   CBC with Differential/Platelet (Completed)   Comprehensive metabolic panel (Completed)   Acute nonintractable headache, unspecified headache type       Relevant Medications   SUMAtriptan (IMITREX) 50 MG tablet   ketorolac (TORADOL) injection 60 mg (Completed)   Migraine without status migrainosus, not intractable, unspecified migraine type       IM toradol given, imitrex sent for prn use if no better. Declines further workup at this time. Strict return precautions given   Relevant Medications   SUMAtriptan (IMITREX) 50 MG tablet   ketorolac (TORADOL) injection 60 mg (Completed)   Flu vaccine need       Relevant Orders   Flu Vaccine QUAD 36+ mos IM (Completed)   Weight gain       Await TSH. Discussed increasing exercise and modifying diet       Follow up plan: Return if symptoms worsen or  fail to improve.

## 2018-11-29 LAB — NOVEL CORONAVIRUS, NAA: SARS-CoV-2, NAA: NOT DETECTED

## 2018-11-29 LAB — COMPREHENSIVE METABOLIC PANEL
ALT: 20 IU/L (ref 0–32)
AST: 25 IU/L (ref 0–40)
Albumin/Globulin Ratio: 2.1 (ref 1.2–2.2)
Albumin: 3.7 g/dL — ABNORMAL LOW (ref 3.8–4.8)
Alkaline Phosphatase: 69 IU/L (ref 39–117)
BUN/Creatinine Ratio: 20 (ref 9–23)
BUN: 17 mg/dL (ref 6–20)
Bilirubin Total: 0.3 mg/dL (ref 0.0–1.2)
CO2: 23 mmol/L (ref 20–29)
Calcium: 8.8 mg/dL (ref 8.7–10.2)
Chloride: 106 mmol/L (ref 96–106)
Creatinine, Ser: 0.87 mg/dL (ref 0.57–1.00)
GFR calc Af Amer: 102 mL/min/{1.73_m2} (ref >59)
GFR calc non Af Amer: 88 mL/min/{1.73_m2} (ref >59)
Globulin, Total: 1.8 g/dL (ref 1.5–4.5)
Glucose: 89 mg/dL (ref 65–99)
Potassium: 4 mmol/L (ref 3.5–5.2)
Sodium: 141 mmol/L (ref 134–144)
Total Protein: 5.5 g/dL — ABNORMAL LOW (ref 6.0–8.5)

## 2018-11-29 LAB — CBC WITH DIFFERENTIAL/PLATELET
Basophils Absolute: 0 10*3/uL (ref 0.0–0.2)
Basos: 0 %
EOS (ABSOLUTE): 0 10*3/uL (ref 0.0–0.4)
Eos: 1 %
Hematocrit: 40.2 % (ref 34.0–46.6)
Hemoglobin: 13.7 g/dL (ref 11.1–15.9)
Immature Grans (Abs): 0 10*3/uL (ref 0.0–0.1)
Immature Granulocytes: 0 %
Lymphocytes Absolute: 1.6 10*3/uL (ref 0.7–3.1)
Lymphs: 35 %
MCH: 29.5 pg (ref 26.6–33.0)
MCHC: 34.1 g/dL (ref 31.5–35.7)
MCV: 87 fL (ref 79–97)
Monocytes Absolute: 0.5 10*3/uL (ref 0.1–0.9)
Monocytes: 11 %
Neutrophils Absolute: 2.4 10*3/uL (ref 1.4–7.0)
Neutrophils: 53 %
Platelets: 198 10*3/uL (ref 150–450)
RBC: 4.64 x10E6/uL (ref 3.77–5.28)
RDW: 12.8 % (ref 11.7–15.4)
WBC: 4.5 10*3/uL (ref 3.4–10.8)

## 2018-11-29 LAB — TSH: TSH: 2.41 u[IU]/mL (ref 0.450–4.500)

## 2018-11-30 ENCOUNTER — Encounter: Payer: Self-pay | Admitting: Family Medicine

## 2018-12-01 ENCOUNTER — Ambulatory Visit: Payer: BC Managed Care – PPO | Admitting: Family Medicine

## 2018-12-06 IMAGING — US US OB COMP LESS 14 WK
1 series · 15 of 28 positions shown · non-contrast
Comparison: None.

CLINICAL DATA: Cramping, light spotting x1 day

EXAM:
OBSTETRIC <14 WK US AND TRANSVAGINAL OB US
TECHNIQUE: Both transabdominal and transvaginal ultrasound examinations were
performed for complete evaluation of the gestation as well as the
maternal uterus, adnexal regions, and pelvic cul-de-sac.
Transvaginal technique was performed to assess early pregnancy.

[Series 1: us ob comp less 14 wk · 15 of 65 slices shown]
[im 1/65]
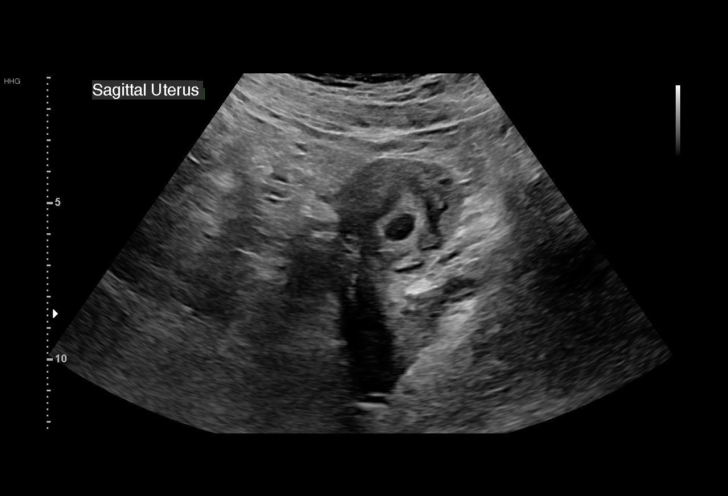
[im 5/65]
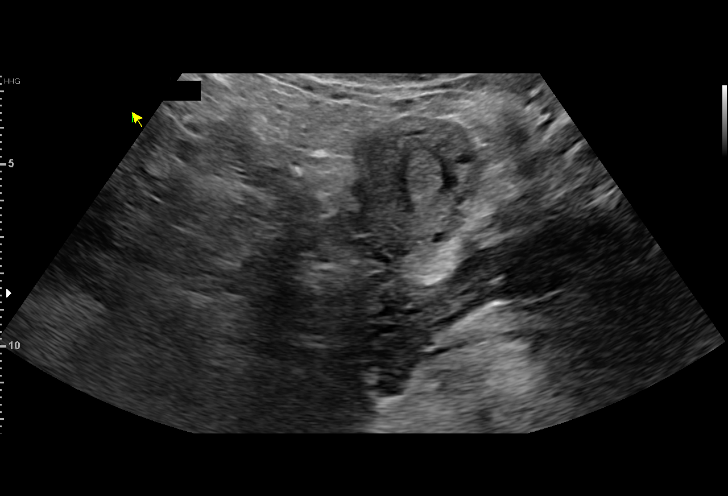
[im 10/65]
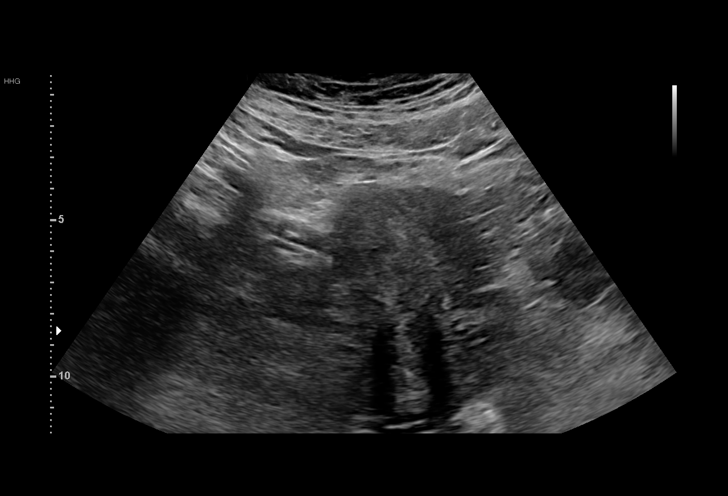
[im 15/65]
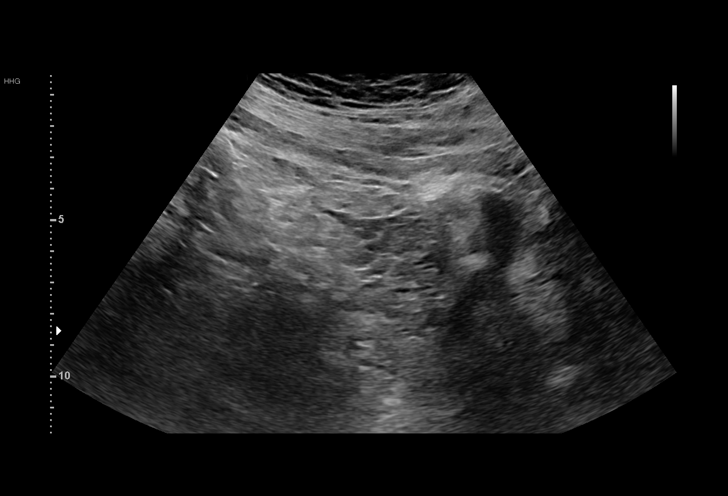
[im 19/65]
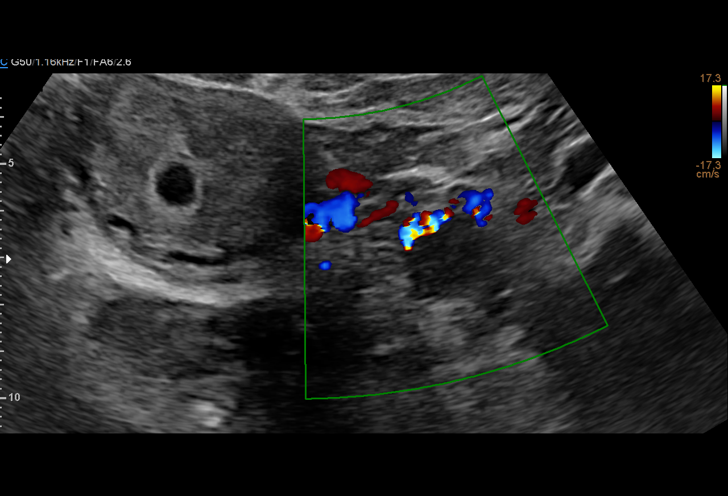
[im 24/65]
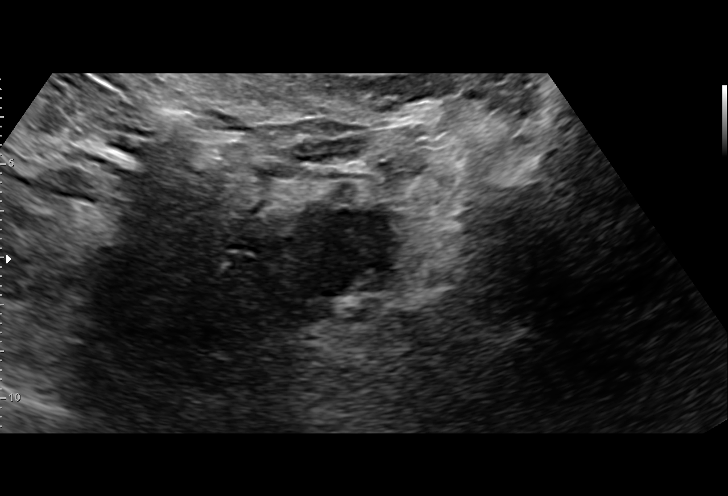
[im 29/65]
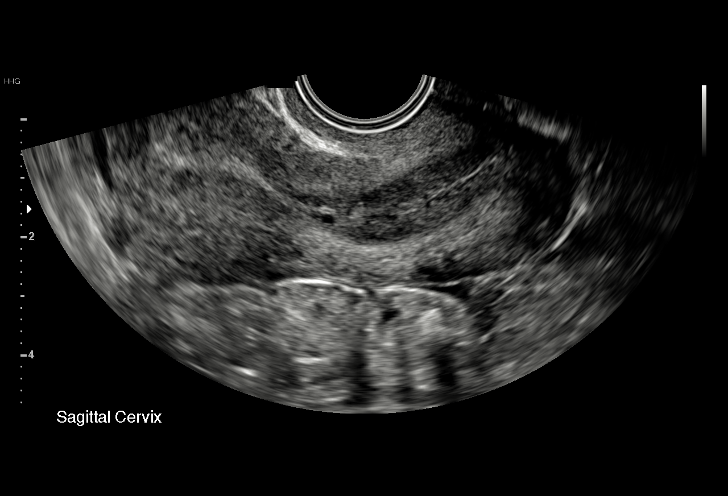
[im 34/65]
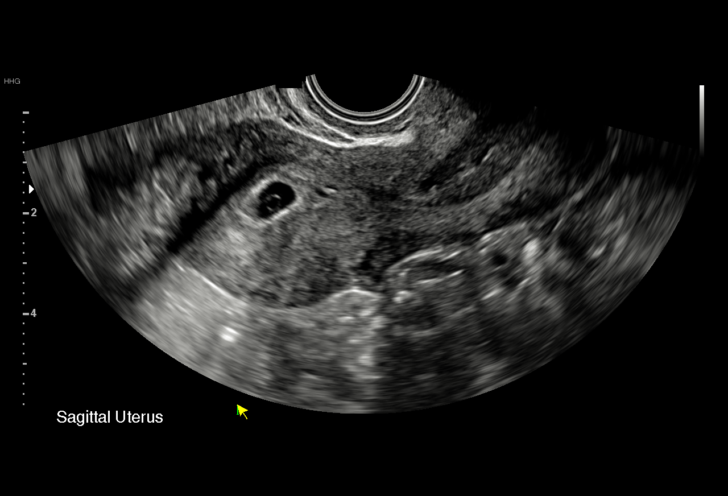
[im 36/65]
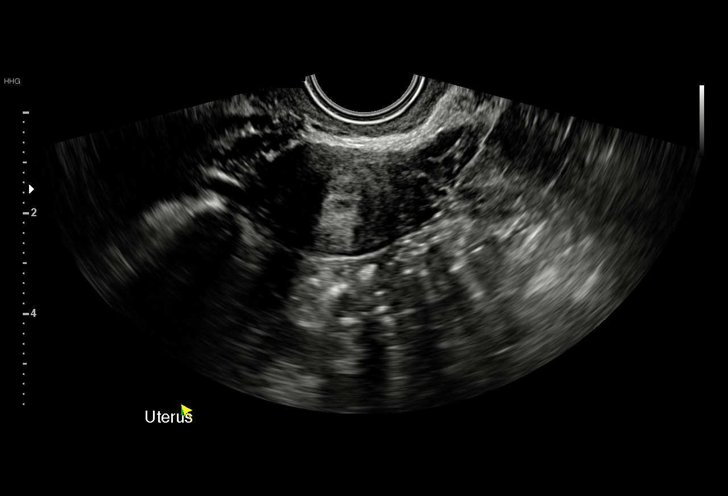
[im 41/65]
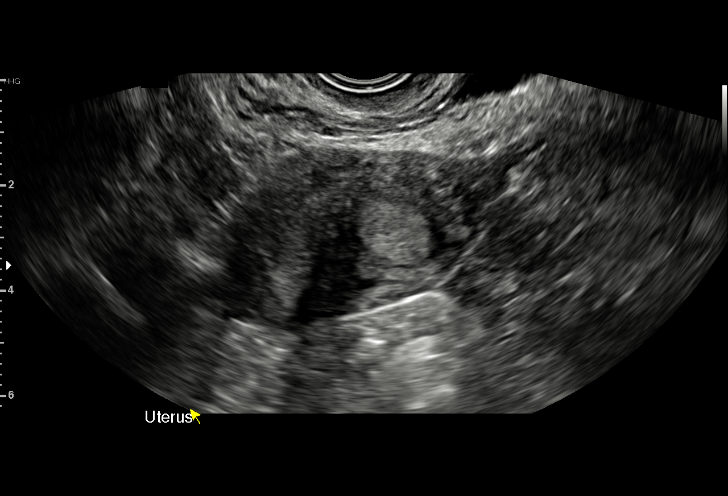
[im 46/65]
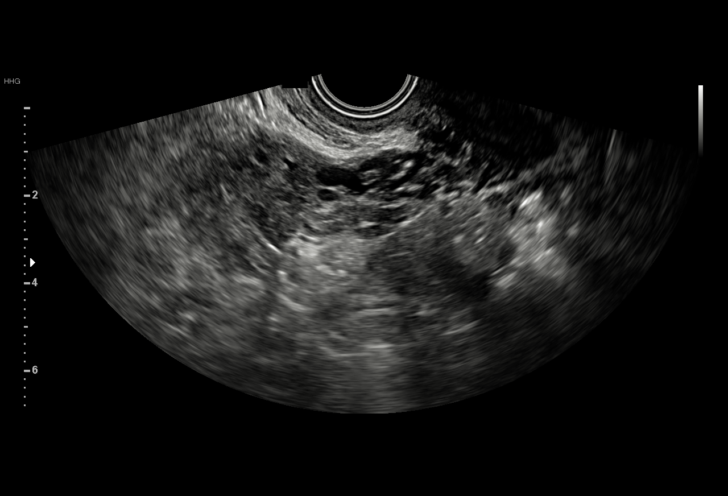
[im 50/65]
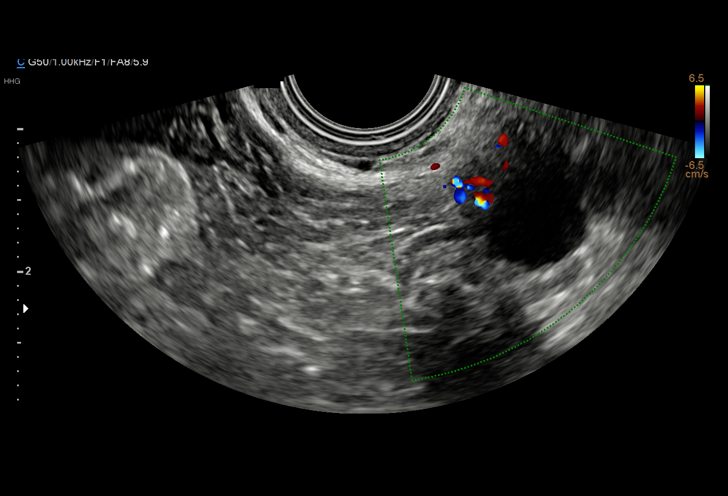
[im 55/65]
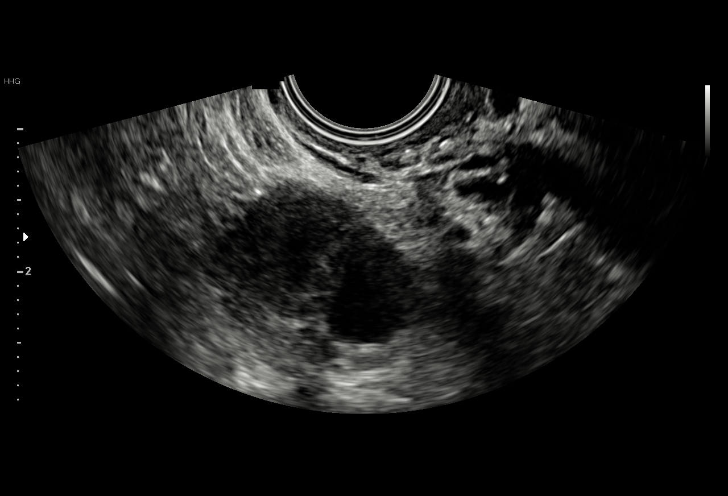
[im 60/65]
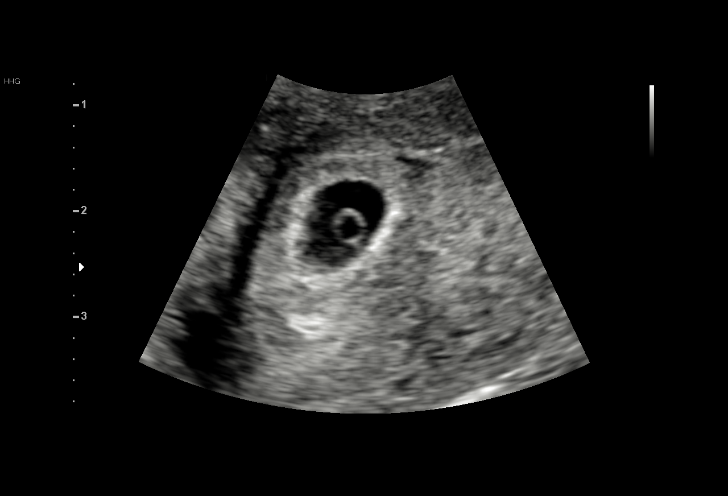
[im 65/65]
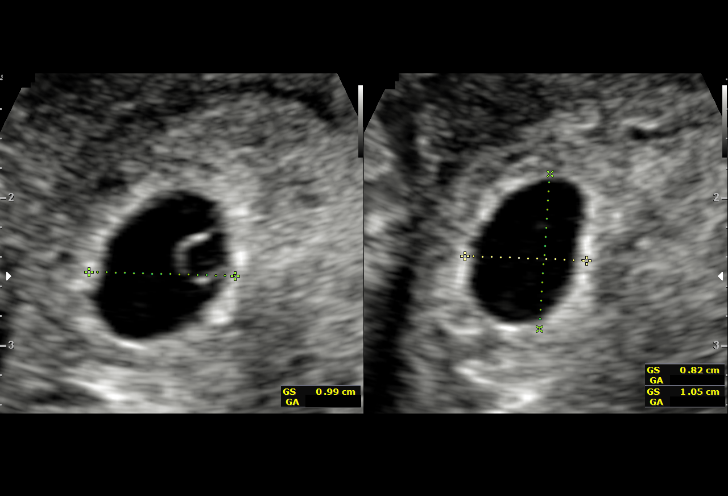

[15 of 28 positions shown; findings below may reference images not displayed]

FINDINGS: Intrauterine gestational sac: Single

Yolk sac:  Visualized.

Embryo:  Not Visualized.

Cardiac Activity: Not applicable

Heart Rate: Not applicable

MSD: 9.5  mm   5 w   5  d

Subchorionic hemorrhage:  None visualized.

Maternal uterus/adnexae: Anteverted uterus. Right ovary not
visualized. Left ovary measures 3.2 x 2.1 x 1.9 cm and is
unremarkable.
IMPRESSION: Probable early intrauterine gestational sac with yolk sac but no
fetal pole, or cardiac activity yet visualized. Recommend follow-up
quantitative B-HCG levels and follow-up US in 14 days to confirm and
assess viability. This recommendation follows SRU consensus
guidelines: N Engl J Med 5438; [DATE].

No perigestational hematoma.

## 2018-12-26 ENCOUNTER — Ambulatory Visit: Payer: BC Managed Care – PPO | Admitting: Obstetrics and Gynecology

## 2018-12-26 ENCOUNTER — Encounter: Payer: Self-pay | Admitting: Obstetrics and Gynecology

## 2018-12-26 ENCOUNTER — Other Ambulatory Visit: Payer: Self-pay

## 2018-12-26 ENCOUNTER — Encounter: Payer: Self-pay | Admitting: Surgical

## 2018-12-26 ENCOUNTER — Telehealth: Payer: Self-pay | Admitting: Obstetrics and Gynecology

## 2018-12-26 VITALS — BP 126/88 | HR 102 | Ht 66.0 in | Wt 220.0 lb

## 2018-12-26 DIAGNOSIS — N751 Abscess of Bartholin's gland: Secondary | ICD-10-CM | POA: Diagnosis not present

## 2018-12-26 NOTE — Addendum Note (Signed)
Addended by: Durwin Glaze on: 12/26/2018 12:04 PM   Modules accepted: Orders

## 2018-12-26 NOTE — Telephone Encounter (Signed)
Patient walked in and was seen

## 2018-12-26 NOTE — Telephone Encounter (Signed)
Pt sent MyChart message requesting appointment today for cyst on vaginal opening. Spoke with nurse Kyra Searles is calling pt to get more information. Please advise.

## 2018-12-26 NOTE — Progress Notes (Signed)
HPI:      [name] is a 33 y.o. G1P1001 who LMP was Patient's last menstrual period was 12/06/2018.  Subjective:   She presents today with complaint of a vaginal cyst that began several days ago and has now become larger and more painful.  She reports that she has never had this before.  She has done some "Internet research" and believes it is a IT trainer gland cyst.    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 She does not have any pertinent problems on file. She  has a past surgical history that includes Tympanoplasty (Right); Tonsillectomy; Wisdom tooth extraction; Appendectomy (N/A, 09/28/2016); and Cesarean section (N/A, 11/01/2016). Her family history includes Depression in her mother; Diabetes in her father and paternal grandfather. She was adopted. She  reports that she has never smoked. She has never used smokeless tobacco. She reports that she does not drink alcohol or use drugs. She has a current medication list which includes the following prescription(s): butalbital-acetaminophen-caffeine, fluticasone, hydroxyzine, ondansetron, pseudoephedrine, and sumatriptan. She is allergic to lisdexamfetamine and sulfa antibiotics.       Review of Systems:  Review of Systems  Constitutional: Denied constitutional symptoms, night sweats, recent illness, fatigue, fever, insomnia and weight loss.  Eyes: Denied eye symptoms, eye pain, photophobia, vision change and visual disturbance.  Ears/Nose/Throat/Neck: Denied ear, nose, throat or neck symptoms,  hearing loss, nasal discharge, sinus congestion and sore throat.  Cardiovascular: Denied cardiovascular symptoms, arrhythmia, chest pain/pressure, edema, exercise intolerance, orthopnea and palpitations.  Respiratory: Denied pulmonary symptoms, asthma, pleuritic pain, productive sputum, cough, dyspnea and wheezing.  Gastrointestinal: Denied, gastro-esophageal reflux, melena, nausea and vomiting.  Genitourinary: See HPI for additional information.  Musculoskeletal: Denied musculoskeletal symptoms, stiffness, swelling, muscle weakness and myalgia.  Dermatologic: Denied dermatology symptoms, rash and scar.  Neurologic: Denied neurology symptoms, dizziness, headache, neck pain and syncope.  Psychiatric: Denied psychiatric symptoms, anxiety and depression.  Endocrine: Denied endocrine symptoms including hot flashes and night sweats.   Meds:   Current Outpatient Medications on File Prior to Visit  Medication Sig Dispense Refill  . butalbital-acetaminophen-caffeine (FIORICET) 50-325-40 MG tablet Take 1-2 tablets by mouth every 6 (six) hours as needed for headache. 15 tablet 0  . fluticasone (FLONASE) 50 MCG/ACT nasal spray Place 2 sprays into both nostrils 2 (two) times daily. 16 g 6  . hydrOXYzine (ATARAX/VISTARIL) 25 MG tablet Take 1 tablet (25 mg total) by mouth 3 (three) times daily as needed. 90 tablet 0  . ondansetron (ZOFRAN ODT) 4 MG disintegrating tablet Take 1 tablet (4 mg total) by mouth every 8 (eight) hours as needed for nausea or vomiting. 15 tablet 0  . pseudoephedrine (SUDAFED 12 HOUR) 120 MG 12 hr tablet Take 1 tablet (120 mg total) by mouth 2 (two) times daily. 14 tablet 0  . SUMAtriptan (IMITREX) 50 MG tablet Take one tab at first sign of migraine. May repeat in 2 hours if headache persists or recurs. 10 tablet 0   No current facility-administered medications on file prior to visit.     Objective:     Vitals:   12/26/18  1047  BP: 126/88  Pulse: (!) 102               Examination reveals a left labial Bartholin gland abscess.  Approximately 2 centimeters in diameter.  Procedure: Cleaned area with lidocaine, injected overlying tissue with lidocaine with epi, stab incision made, cyst drained of purulent malodorous puss, silver nitrate applied to provide excellent hemostasis. (Cyst too small to accommodate Word catheter)  Assessment:    G1P1001 Patient Active Problem List   Diagnosis Date Noted  . Anxiety 03/14/2018  . Increased BMI 07/09/2017  . Adopted 05/03/2016  . Adjustment disorder with anxious mood 12/21/2014  . 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 Bartholin's gland abscess     I&D today  GC/CT-bacterial cultures performed   Plan:            1.  Patient to do home sitz bath's.  General care discussed.  2.  Follow-up 2 weeks for cyst follow-up and annual examination. Orders Orders Placed This Encounter  Procedures  . US OB Comp + 14 Wk    No orders of the defined types were placed in this encounter.     F/U  Return in about 2 weeks (around 01/09/2019) for Annual Physical. I spent 16 minutes involved in the care of this patient of which greater than 50% was spent discussing Bartholin gland cyst abscess, causes and future occurrences, procedure risks and benefits, all questions answered.  Finis Bud, M.D. 12/26/2018 11:38 AM

## 2018-12-26 NOTE — Telephone Encounter (Signed)
Pts message:   " Appointment Request From: Rainey Pines    With Provider: Jeannie Fend, MD [facility]]    Preferred Date Range: 12/26/2018 - 12/26/2018    Preferred Times: Any Time    Reason for visit: Request an Appointment    Comments:  Painful cyst on the inside of opening of vagina. Hurts to sit or walk.    "

## 2018-12-26 NOTE — Progress Notes (Signed)
Patient comes in today for vaginal cyst. She noticed at the beginning of the week. Patient states that it is the size of a dime.

## 2018-12-26 NOTE — Telephone Encounter (Signed)
Tried to call patient to get more information. Phone number is not working. Sent my chart message asking her to call the office for more info.

## 2018-12-28 LAB — STREP GP B NAA: Strep Gp B NAA: NEGATIVE

## 2019-01-03 LAB — GC/CHLAMYDIA PROBE AMP
Chlamydia trachomatis, NAA: NEGATIVE
Neisseria Gonorrhoeae by PCR: NEGATIVE

## 2019-01-09 ENCOUNTER — Encounter: Payer: BC Managed Care – PPO | Admitting: Obstetrics and Gynecology

## 2019-01-21 ENCOUNTER — Other Ambulatory Visit: Payer: Self-pay

## 2019-01-21 ENCOUNTER — Ambulatory Visit (INDEPENDENT_AMBULATORY_CARE_PROVIDER_SITE_OTHER): Payer: BC Managed Care – PPO | Admitting: Obstetrics and Gynecology

## 2019-01-21 ENCOUNTER — Encounter: Payer: Self-pay | Admitting: Obstetrics and Gynecology

## 2019-01-21 VITALS — BP 129/92 | HR 79 | Ht 66.0 in | Wt 227.5 lb

## 2019-01-21 DIAGNOSIS — Z01419 Encounter for gynecological examination (general) (routine) without abnormal findings: Secondary | ICD-10-CM | POA: Diagnosis not present

## 2019-01-21 NOTE — Progress Notes (Signed)
HPI:      [name] is a 33 y.o. G1P1001 who LMP was Patient's last menstrual period was 01/03/2019.  Subjective:   She presents today for her annual examination.  She has no complaints.  She is having normal regular cycles.  She is not sexually active and does not desire any birth control at this time. Of significant note her recent Bartholin cyst resolved soon after I&D and she has had no problems since.    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 She does not have any pertinent problems on file. She  has a past surgical history that includes Tympanoplasty (Right); Tonsillectomy; Wisdom tooth extraction; Appendectomy (N/A, 09/28/2016); and Cesarean section (N/A, 11/01/2016). Her family history includes Depression in her mother; Diabetes in her father and paternal grandfather. She was adopted. She  reports that she has never smoked. She has never used smokeless tobacco. She reports that she does not drink alcohol or use drugs. She has a current medication list which includes the following prescription(s): butalbital-acetaminophen-caffeine, fluticasone, hydroxyzine, ondansetron, pseudoephedrine, and sumatriptan. She is allergic to lisdexamfetamine and sulfa antibiotics.       Review of Systems:  Review of Systems  Constitutional: Denied constitutional symptoms, night sweats, recent illness, fatigue, fever, insomnia and weight loss.  Eyes: Denied eye symptoms, eye pain, photophobia, vision change and visual disturbance.  Ears/Nose/Throat/Neck: Denied  ear, nose, throat or neck symptoms, hearing loss, nasal discharge, sinus congestion and sore throat.  Cardiovascular: Denied cardiovascular symptoms, arrhythmia, chest pain/pressure, edema, exercise intolerance, orthopnea and palpitations.  Respiratory: Denied pulmonary symptoms, asthma, pleuritic pain, productive sputum, cough, dyspnea and wheezing.  Gastrointestinal: Denied, gastro-esophageal reflux, melena, nausea and vomiting.  Genitourinary: Denied genitourinary symptoms including symptomatic vaginal discharge, pelvic relaxation issues, and urinary complaints.  Musculoskeletal: Denied musculoskeletal symptoms, stiffness, swelling, muscle weakness and myalgia.  Dermatologic: Denied dermatology symptoms, rash and scar.  Neurologic: Denied neurology symptoms, dizziness, headache, neck pain and syncope.  Psychiatric: Denied psychiatric symptoms, anxiety and depression.  Endocrine: Denied endocrine symptoms including hot flashes and night sweats.   Meds:   Current Outpatient Medications on File Prior to Visit  Medication Sig Dispense Refill  . butalbital-acetaminophen-caffeine (FIORICET) 50-325-40 MG tablet Take 1-2 tablets by mouth every 6 (six) hours as needed for headache. 15 tablet 0  . fluticasone (FLONASE) 50 MCG/ACT nasal spray Place 2 sprays into both nostrils 2 (two) times daily. 16 g 6  . hydrOXYzine (ATARAX/VISTARIL) 25 MG tablet Take 1 tablet (25 mg total) by mouth 3 (three) times daily as needed. 90 tablet 0  . ondansetron (ZOFRAN ODT) 4 MG disintegrating tablet Take 1 tablet (4 mg total) by mouth every 8 (eight) hours as needed for nausea or vomiting. 15 tablet 0  . pseudoephedrine (SUDAFED 12 HOUR) 120 MG 12 hr tablet Take 1 tablet (120 mg total) by mouth 2 (two) times daily. 14 tablet 0  . SUMAtriptan (IMITREX) 50 MG tablet Take one tab at first sign of migraine. May repeat in 2 hours if headache persists or recurs. 10 tablet 0   No current facility-administered medications on  file prior  to visit.     Objective:     Vitals:   01/21/19 1523  BP: (!) 129/92  Pulse: 79              Physical examination General NAD, Conversant  HEENT Atraumatic; Op clear with mmm.  Normo-cephalic. Pupils reactive. Anicteric sclerae  Thyroid/Neck Smooth without nodularity or enlargement. Normal ROM.  Neck Supple.  Skin No rashes, lesions or ulceration. Normal palpated skin turgor. No nodularity.  Breasts: No masses or discharge.  Symmetric.  No axillary adenopathy.  Lungs: Clear to auscultation.No rales or wheezes. Normal Respiratory effort, no retractions.  Heart: NSR.  No murmurs or rubs appreciated. No periferal edema  Abdomen: Soft.  Non-tender.  No masses.  No HSM. No hernia  Extremities: Moves all appropriately.  Normal ROM for age. No lymphadenopathy.  Neuro: Oriented to PPT.  Normal mood. Normal affect.     Pelvic:   Vulva: Normal appearance.  No lesions.  Vagina: No lesions or abnormalities noted.  Support: Normal pelvic support.  Urethra No masses tenderness or scarring.  Meatus Normal size without lesions or prolapse.  Cervix: Normal appearance.  No lesions.  Anus: Normal exam.  No lesions.  Perineum: Normal exam.  No lesions.        Bimanual   Uterus: Normal size.  Non-tender.  Mobile.  AV.  Adnexae: No masses.  Non-tender to palpation.  Cul-de-sac: Negative for abnormality.      Assessment:    G1P1001 Patient Active Problem List   Diagnosis Date Noted  . Anxiety 03/14/2018  . Increased BMI 07/09/2017  . Adopted 05/03/2016  . Adjustment disorder with anxious mood 12/21/2014  . 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 Well woman exam with routine gynecological exam        Plan:            1.  Basic Screening Recommendations The basic screening recommendations for asymptomatic women were discussed with the patient during her visit.  The age-appropriate recommendations were discussed with her and the rational  for the tests reviewed.  When I am informed by the patient that another primary care physician has previously obtained the age-appropriate tests and they are up-to-date, only outstanding tests are ordered and referrals given as necessary.  Abnormal results of tests will be discussed with her when all of her results are completed.  Routine preventative health maintenance measures emphasized: Exercise/Diet/Weight control, Tobacco Warnings, Alcohol/Substance use risks and Stress Management  Orders No orders of the defined types were placed in this encounter.   No orders of the defined types were placed in this encounter.       F/U  Return in about 1 year (around 01/21/2020) for Annual Physical.  Finis Bud, M.D. 01/21/2019 4:21 PM

## 2019-01-21 NOTE — Progress Notes (Signed)
Patient comes in today for yearly exam. Last Pap 07/2017 that was normal. Labs through PCP.

## 2019-02-11 ENCOUNTER — Other Ambulatory Visit: Payer: Self-pay

## 2019-02-11 ENCOUNTER — Ambulatory Visit
Admission: EM | Admit: 2019-02-11 | Discharge: 2019-02-11 | Disposition: A | Discharge status: Home / Self Care | Payer: BC Managed Care – PPO | Attending: Family Medicine | Admitting: Family Medicine

## 2019-02-11 DIAGNOSIS — G43709 Chronic migraine without aura, not intractable, without status migrainosus: Secondary | ICD-10-CM | POA: Diagnosis not present

## 2019-02-11 MED ORDER — KETOROLAC TROMETHAMINE 60 MG/2ML IM SOLN
60.0000 mg | Freq: Once | INTRAMUSCULAR | Status: AC
  Administered 2019-02-11: 60 mg via INTRAMUSCULAR

## 2019-02-11 NOTE — ED Provider Notes (Signed)
[facility]    CSN: [id number] Arrival date & time: 02/11/19  [id number]   History   Chief Complaint Chief Complaint  Patient presents with  . Migraine   HPI  33 year old female presents with migraine.  Patient states that she has a history of migraine.  Patient reports that she has had a migraine headache for the past 3 days.  Pain is located in the frontal region and extends posteriorly.  Bilateral.  Rates her pain as 8/10 in severity.  Associated nausea and photophobia.  She has taken ibuprofen and Imitrex without resolution.  Patient attributes the onset to recent stress and poor sleeping.  No other known exacerbating factors.  No other complaints or concerns at this time.  PMH, Surgical Hx, Family Hx, Social History reviewed and updated as below.  Past Medical History:  Diagnosis Date  . ADD (attention deficit disorder)   . ADHD   . Anxiety   . Appendicitis 09/28/2016  . Chronic ear infection   . Depression   . 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 Neuromuscular disorder (HCC)   . Preeclampsia, third trimester 10/30/2016  . Pregnancy induced hypertension   . 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   7/25: 31%, 2555gm, AC 10%, BPP 8/8, normal UA dopplers, normal AFI, cephalic  MFM recs 39wk delivery, no need for further AP testing.     Patient Active Problem List   Diagnosis Date Noted  . Anxiety 03/14/2018  . Increased BMI 07/09/2017  . Adopted 05/03/2016  . Adjustment disorder with anxious mood 12/21/2014  . 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 APPENDECTOMY N/A 09/28/2016   Procedure: APPENDECTOMY;  Surgeon: Avel Peaceosenbower, Todd, MD;  Location: [facility];  Service: General;  Laterality: N/A;  . CESAREAN SECTION N/A 11/01/2016   Procedure: CESAREAN SECTION;  Surgeon: [place] BingPickens, Charlie, MD;  [facility];  Service:  Obstetrics;  Laterality: N/A;  . TONSILLECTOMY    . TYMPANOPLASTY Right   . WISDOM TOOTH EXTRACTION      OB History    Gravida  1   Para  1   Term  1   Preterm      AB      Living  1     SAB      TAB      Ectopic      Multiple  0   Live Births  1            Home Medications    Prior to Admission medications   Medication Sig Start Date End Date Taking? Authorizing Provider  butalbital-acetaminophen-caffeine (FIORICET) 50-325-40 MG tablet Take 1-2 tablets by mouth every 6 (six) hours as needed for headache. 11/20/18 11/20/19  Bridget HartshornSummers, Rhonda L, PA-C  fluticasone [facility](FLONASE) 50 MCG/ACT nasal spray Place 2 sprays into both nostrils 2 (two) times daily. 11/28/18   Particia NearingLane, Rachel Elizabeth, PA-C  hydrOXYzine (ATARAX/VISTARIL) 25 MG tablet Take 1 tablet (25 mg total) by mouth 3 (three) times daily as needed. 03/14/18   Particia NearingLane, Rachel Elizabeth, PA-C  ondansetron (ZOFRAN ODT) 4 MG disintegrating tablet Take 1 tablet (4 mg total) by mouth every 8 (eight) hours as needed for nausea or vomiting. 11/20/18   Tommi RumpsSummers, Rhonda L, PA-C  pseudoephedrine (SUDAFED 12 HOUR) 120 MG 12 hr tablet Take 1 tablet (120 mg total) by mouth 2 (two) times daily. 04/08/18  Volney American, PA-C  SUMAtriptan (IMITREX) 50 MG tablet Take one tab at first sign of migraine. May repeat in 2 hours if headache persists or recurs. 11/28/18   Volney American, PA-C    Family History Family History  Adopted: Yes  Problem Relation Age of Onset  . Depression Mother   . Diabetes Father   . Diabetes Paternal Grandfather   . Breast cancer Neg Hx   . Ovarian cancer Neg Hx   . Colon cancer Neg Hx     Social History Social History   Tobacco Use  . Smoking status: Never Smoker  . Smokeless tobacco: Never Used  Substance Use Topics  . Alcohol use: No    Alcohol/week: 0.0 standard drinks  . Drug use: No     Allergies   Lisdexamfetamine and Sulfa antibiotics   Review of Systems Review of  Systems  Eyes: Positive for photophobia.  Gastrointestinal: Positive for nausea.  Neurological: Positive for headaches.   Physical Exam Triage Vital Signs ED Triage Vitals  Enc Vitals Group     BP 02/11/19 1026 133/87     Pulse Rate 02/11/19 1026 93     Resp --      Temp 02/11/19 1026 98.3 F (36.8 C)     Temp Source 02/11/19 1026 Oral     SpO2 02/11/19 1026 100 %     Weight 02/11/19 1028 220 lb (99.8 kg)     Height --      Head Circumference --      Peak Flow --      Pain Score 02/11/19 1028 8     Pain Loc --      Pain Edu? --      Excl. in [place]? --    Updated Vital Signs BP 133/87 (BP Location: Left Arm)   Pulse 93   Temp 98.3 F (36.8 C) (Oral)   Wt 99.8 kg   LMP 01/03/2019   SpO2 100%   BMI 35.51 kg/m   Visual Acuity Right Eye Distance:   Left Eye Distance:   Bilateral Distance:    Right Eye Near:   Left Eye Near:    Bilateral Near:     Physical Exam Vitals signs and nursing note reviewed.  Constitutional:      General: She is not in acute distress.    Appearance: Normal appearance. She is obese. She is not ill-appearing.  HENT:     Head: Normocephalic and atraumatic.  Eyes:     General:        Right eye: No discharge.        Left eye: No discharge.     Conjunctiva/sclera: Conjunctivae normal.     Pupils: Pupils are equal, round, and reactive to light.  Cardiovascular:     Rate and Rhythm: Normal rate and regular rhythm.     Heart sounds: No murmur.  Pulmonary:     Effort: Pulmonary effort is normal.     Breath sounds: Normal breath sounds. No wheezing, rhonchi or rales.  Neurological:     General: No focal deficit present.     Mental Status: She is alert and oriented to person, place, and time.  Psychiatric:        Mood and Affect: Mood normal.        Behavior: Behavior normal.    UC Treatments / Results  Labs (all labs ordered are listed, but only abnormal results are displayed) Labs Reviewed - No data to display  EKG  Radiology No  results found.  Procedures Procedures (including critical care time)  Medications Ordered in UC Medications  ketorolac (TORADOL) injection 60 mg (60 mg Intramuscular Given 02/11/19 1046)    Initial Impression / Assessment and Plan / UC Course  I have reviewed the triage vital signs and the nursing notes.  Pertinent labs & imaging results that were available during my care of the patient were reviewed by me and considered in my medical decision making (see chart for details).    33 year old female presents with acute migraine.  Toradol given today.  Patient to continue home medications.  Patient will be seeing neurology in the near future.  Final Clinical Impressions(s) / UC Diagnoses   Final diagnoses:  Chronic migraine without aura without status migrainosus, not intractable   Discharge Instructions   None    ED Prescriptions    None     PDMP not reviewed this encounter.   Tommie Sams, DO 02/11/19 1056

## 2019-02-11 NOTE — ED Triage Notes (Signed)
Pt. States she has had a mentally stressful weekend and so has caused a migraine for 3 days.

## 2019-05-20 IMAGING — US US MFM OB FOLLOW-UP
1 series · 14 of 28 positions shown · non-contrast
Comparison: none

OB/Gyn Clinic
[REDACTED]

1  SATYA DUQUE              [id number]      [id number]     [id number]
Indications
29 weeks gestation of pregnancy
Encounter for other antenatal screening
follow-up
Obesity complicating pregnancy, second
trimester
OB History
Blood Type:            Height:  5'7"   Weight (lb):  225      BMI:
Gravidity:    1
Fetal Evaluation
Num Of Fetuses:     1
Fetal Heart         155
Rate(bpm):
Cardiac Activity:   Observed
Presentation:       Breech
Placenta:           Posterior, above cervical os
P. Cord Insertion:  Previously Visualized
Amniotic Fluid
AFI FV:      Subjectively within normal limits
AFI Sum(cm)     %Tile       Largest Pocket(cm)
11.67           26
RUQ(cm)       RLQ(cm)       LUQ(cm)        LLQ(cm)
2.41
Biometry
BPD:      72.6  mm     G. Age:  29w 1d         28  %    CI:        74.68   %   70 - 86
FL/HC:      20.1   %   19.6 -
HC:      266.6  mm     G. Age:  29w 0d         10  %    HC/AC:      1.08       0.99 -
AC:       248   mm     G. Age:  29w 0d         32  %    FL/BPD:     73.8   %   71 - 87
FL:       53.6  mm     G. Age:  28w 3d         13  %    FL/AC:      21.6   %   20 - 24
HUM:        50  mm     G. Age:  29w 2d         45  %
Est. FW:    9070  gm    2 lb 14 oz      40  %
Gestational Age
LMP:           29w 3d       Date:   02/13/16                 EDD:   11/19/16
U/S Today:     28w 6d                                        EDD:   11/23/16
Best:          29w 3d    Det. By:   LMP  (02/13/16)          EDD:   11/19/16
Anatomy
Cranium:               Appears normal         Aortic Arch:            Appears normal
Cavum:                 Previously seen        Ductal Arch:            Previously seen
Ventricles:            Appears normal         Diaphragm:              Appears normal
Choroid Plexus:        Previously seen        Stomach:                Appears normal, left
sided
Cerebellum:            Previously seen        Abdomen:                Appears normal
Posterior Fossa:       Previously seen        Abdominal Wall:         Previously seen
Nuchal Fold:           Not applicable (>20    Cord Vessels:           Previously seen
wks GA)
Face:                  Orbits and profile     Kidneys:                Appear normal
previously seen
Lips:                  Previously seen        Bladder:                Appears normal
Thoracic:              Appears normal         Spine:                  Previously seen
Heart:                 Appears normal         Upper Extremities:      Previously seen
(4CH, axis, and situs
RVOT:                  Appears normal         Lower Extremities:      Previously seen
LVOT:                  Previously seen
Other:  Female gender. Heels and 5th digit previously visualized. Technically
difficult due to maternal habitus and fetal position.
Cervix Uterus Adnexa
Cervix
Length:           3.07  cm.
Normal appearance by transabdominal scan.
Impression
INDICATION: 30 yr old G1P0 at 42w3d for follow up ultrasound
to reevaluate fetal anatomy and placental location. Remote
read.

[Series 1: us mfm ob follow-up · 14 of 58 slices shown]
[im 3/58]
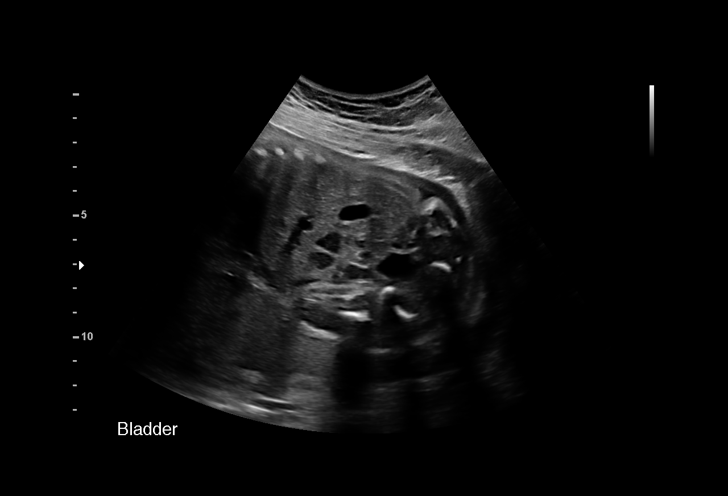
[im 7/58]
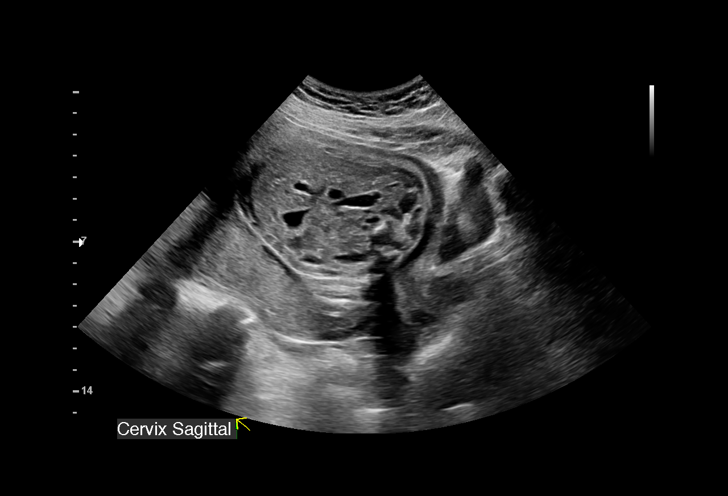
[im 11/58]
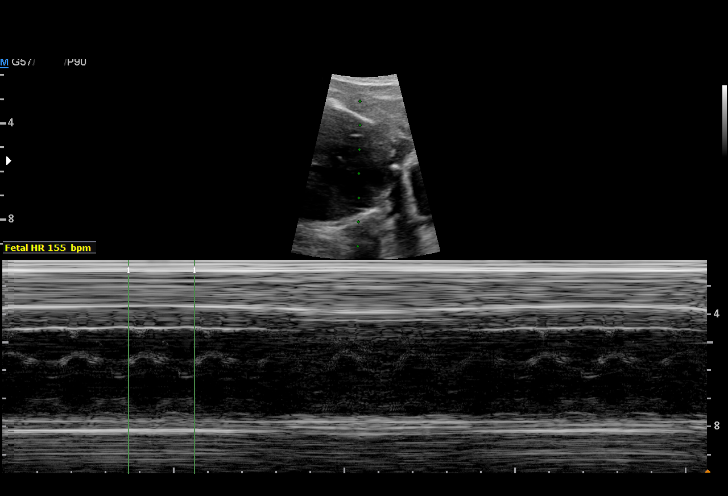
[im 15/58]
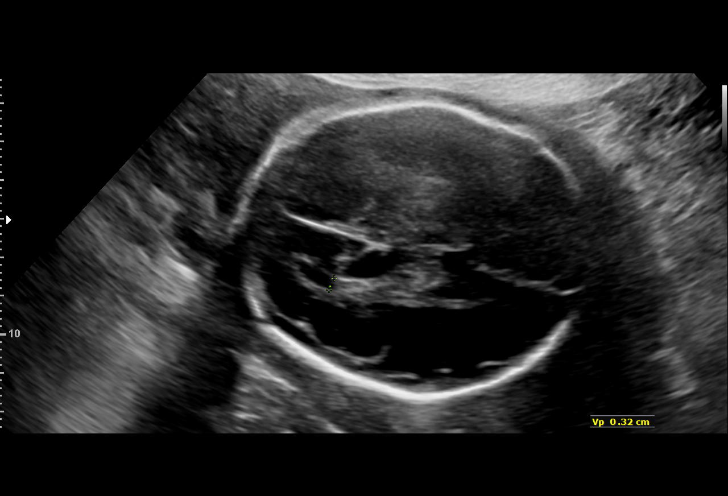
[im 20/58]
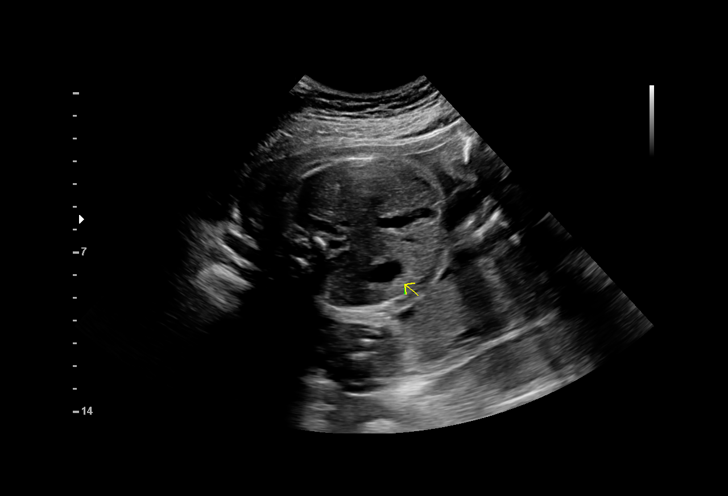
[im 24/58]
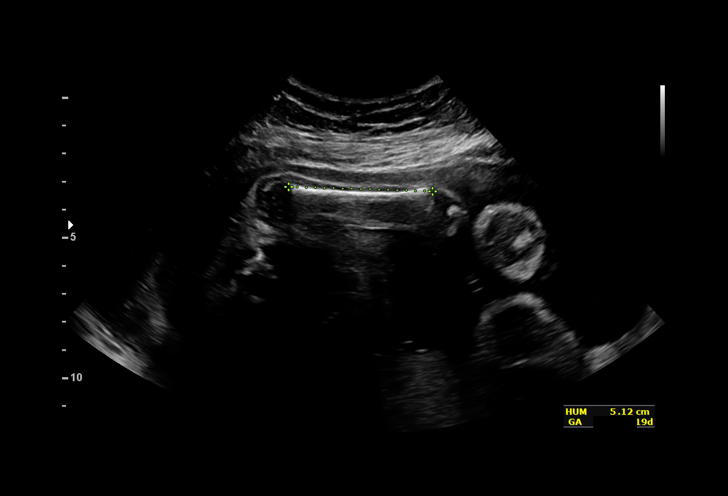
[im 28/58]
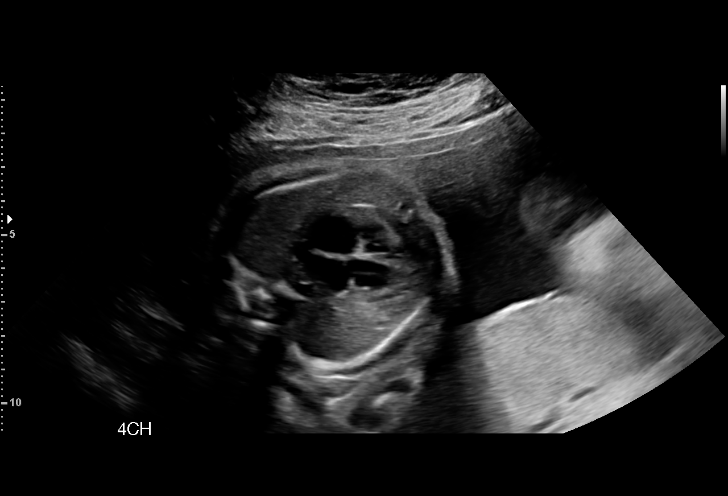
[im 32/58]
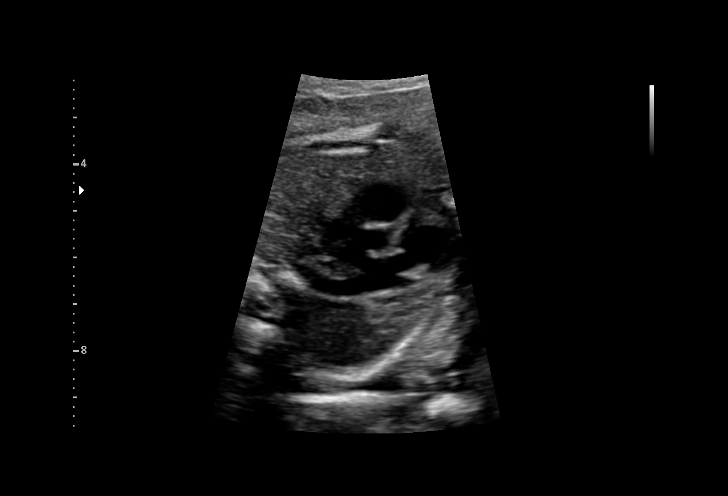
[im 36/58]
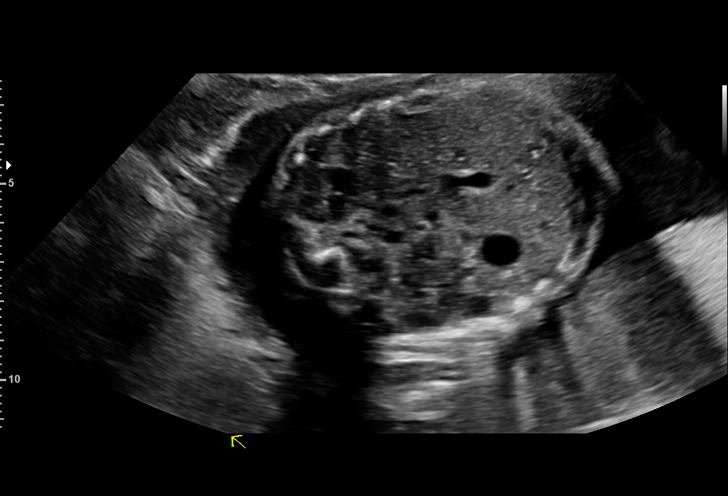
[im 41/58]
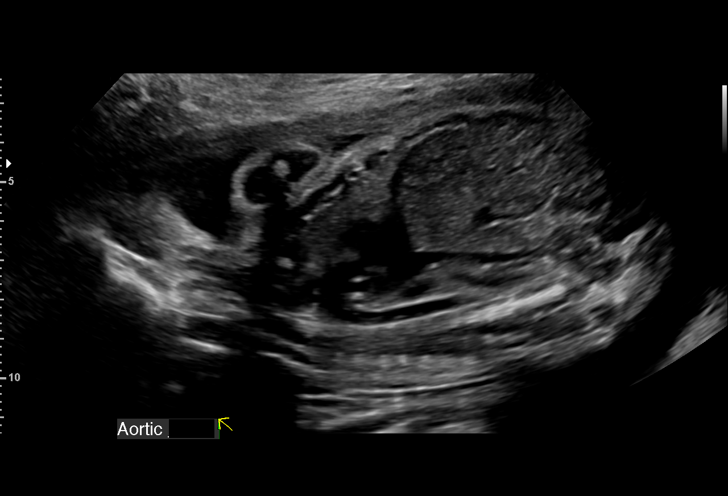
[im 45/58]
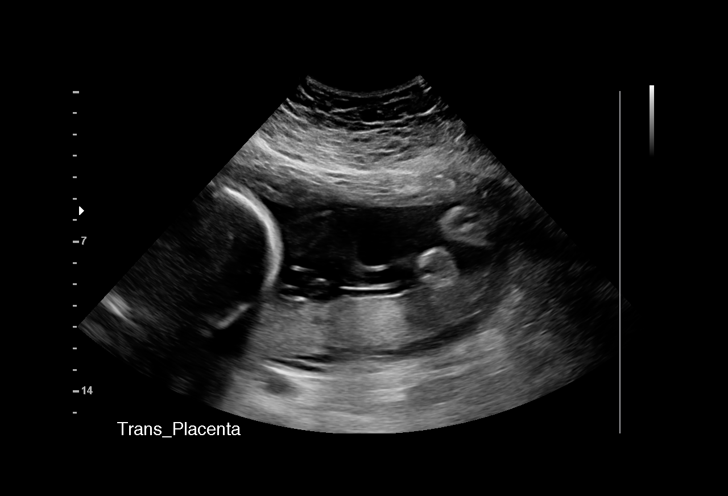
[im 49/58]
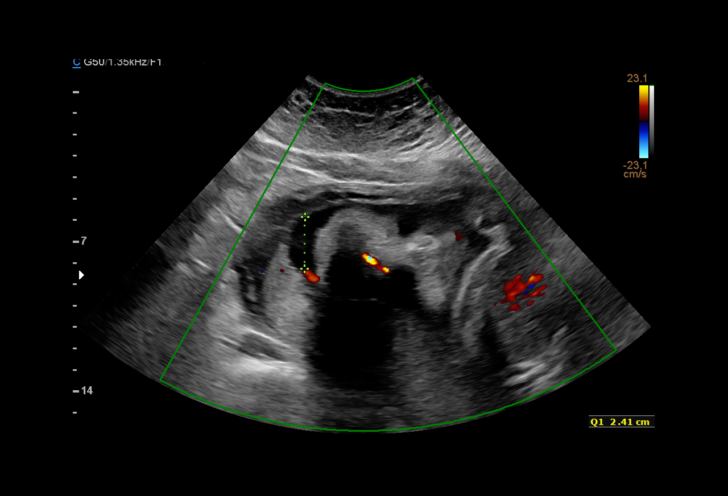
[im 53/58]
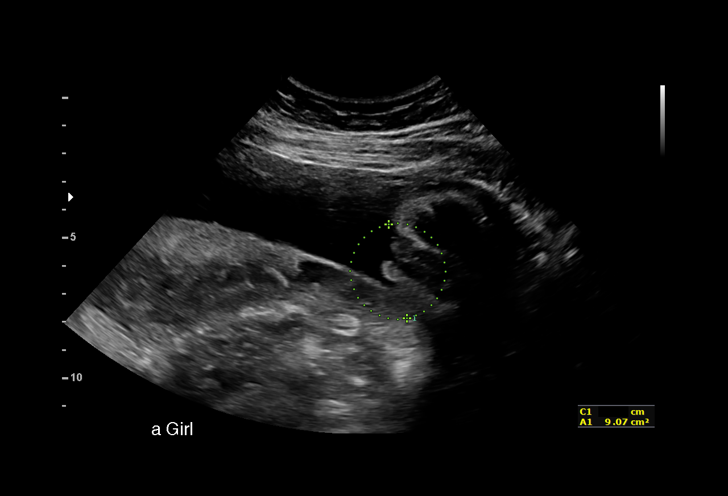
[im 58/58]
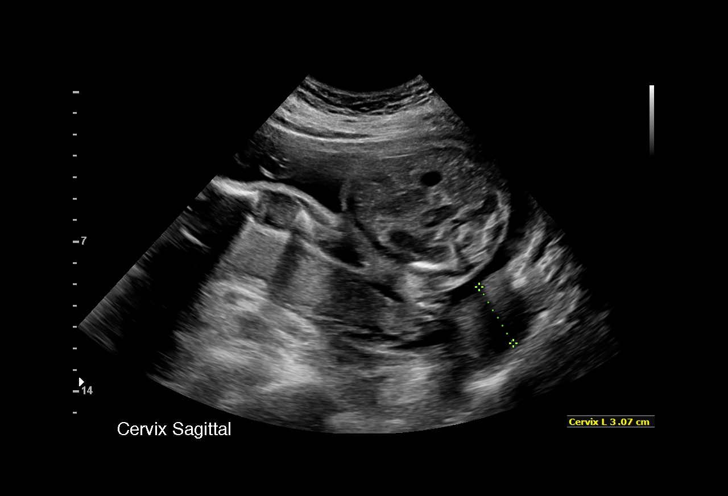

[14 of 28 positions shown; findings below may reference images not displayed]

FINDINGS: 1. Single intrauterine pregnancy.
2. Estimated fetal weight is in the 40th%.
3. Posterior placenta without evidence of previa.
4. Normal amniotic fluid volume.
5. Normal transabdominal cervical length.
6. The limited anatomy survey is normal; any anatomy not
evaluated on today's exam was evaluated on the previous
exam.
Recommendations

1. Appropriate fetal growth.
2. Fetal anatomic survey is complete.
3. Low lying placenta has resolved.
4. Recommend follow up ultrasounds as clinically indicated.

## 2019-05-31 ENCOUNTER — Other Ambulatory Visit: Payer: Self-pay

## 2019-05-31 ENCOUNTER — Ambulatory Visit
Admission: EM | Admit: 2019-05-31 | Discharge: 2019-05-31 | Disposition: A | Discharge status: Home / Self Care | Payer: BC Managed Care – PPO | Attending: Emergency Medicine | Admitting: Emergency Medicine

## 2019-05-31 DIAGNOSIS — K0889 Other specified disorders of teeth and supporting structures: Secondary | ICD-10-CM | POA: Diagnosis not present

## 2019-05-31 MED ORDER — CHLORHEXIDINE GLUCONATE 0.12 % MT SOLN: 15.0000 mL | Freq: Three times a day (TID) | OROMUCOSAL | 0 refills | Status: AC

## 2019-05-31 MED ORDER — LIDOCAINE VISCOUS HCL 2 % MT SOLN
15.0000 mL | OROMUCOSAL | 1 refills | Status: AC | PRN
Start: 2019-05-31 — End: ?

## 2019-05-31 NOTE — Discharge Instructions (Addendum)
Viscous lidocaine prescribed.  Use as directed for pain relief Chlorhexidine mouthwash was prescribed Continue to take Tylenol with codeine as prescribed Take Augmentin as directed and to completion Recommend a soft diet  Maintain proper dental hygiene Follow up with dentist as soon as possible for further evaluation and treatment

## 2019-05-31 NOTE — ED Triage Notes (Signed)
Pt presents to UC w/ c/o left side tooth pulled x 3 days ago, is currently taking amoxicillin. Pain and swelling along jaw where tooth was pulled. migrane has developed now

## 2019-05-31 NOTE — ED Provider Notes (Signed)
[facility]    CSN: [id number] Arrival date & time: 05/31/19  1451      History   Chief Complaint Chief Complaint  Patient presents with  . Dental Pain    HPI Allison Moreno is a 34 y.o. female.   Who presented to the urgent care with a  complaint of dental pain after wisdom tooth extraction.  Denies a precipitating event or trauma.  Localizes pain to her left lower gum.  Has tried Tylenol with codeine and Augmentin with mild relief.  Worse with chewing.  Does not see a dentist regularly.  Denies similar symptoms in the past.  Denies fever, chills, dysphagia, odynophagia, oral or neck swelling, nausea, vomiting, chest pain, SOB.       Past Medical History:  Diagnosis Date  . ADD (attention deficit disorder)   . ADHD   . Anxiety   . Appendicitis 09/28/2016  . Chronic ear infection   . Depression   . 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 Neuromuscular disorder (HCC)   . Preeclampsia, third trimester 10/30/2016  . Pregnancy induced hypertension   . 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 small [facility] 10/25/2016   7/25: 31%, 2555gm, AC 10%, BPP 8/8, normal UA dopplers, normal AFI, cephalic  MFM recs 39wk delivery, no need for further AP testing.     Patient Active Problem List   Diagnosis Date Noted  . Anxiety 03/14/2018  . Increased BMI 07/09/2017  . Adopted 05/03/2016  . Adjustment disorder with anxious mood 12/21/2014  . 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 APPENDECTOMY N/A 09/28/2016   Procedure: APPENDECTOMY;  Surgeon: Avel Peace, MD;  Location: [facility];  Service: General;  Laterality: N/A;  . CESAREAN SECTION N/A 11/01/2016   Procedure: CESAREAN SECTION;  Surgeon:  Bing, MD;  Location: [facility];  Service: Obstetrics;  Laterality: N/A;  . TONSILLECTOMY    . TYMPANOPLASTY Right   . WISDOM TOOTH EXTRACTION       OB History    Gravida  1   Para  1   Term  1   Preterm      AB      Living  1     SAB      TAB      Ectopic      Multiple  0   Live Births  1            Home Medications    Prior to Admission medications   Medication Sig Start Date End Date Taking? Authorizing Provider  butalbital-acetaminophen-caffeine (FIORICET) 50-325-40 MG tablet Take 1-2 tablets by mouth every 6 (six) hours as needed for headache. 11/20/18 11/20/19  Tommi Rumps, PA-C  chlorhexidine (PERIDEX) 0.12 % solution Use as directed 15 mLs in the mouth or throat 3 (three) times daily after meals. 05/31/19   Deundra Furber, Zachery Dakins, FNP  fluticasone (FLONASE) 50 MCG/ACT nasal spray Place 2 sprays into both nostrils 2 (two) times daily. 11/28/18   Particia Nearing, PA-C  hydrOXYzine (ATARAX/VISTARIL) 25 MG tablet Take 1 tablet (25 mg total) by mouth 3 (three) times daily as needed. 03/14/18   Particia Nearing, PA-C  lidocaine (XYLOCAINE) 2 % solution Use as directed 15 mLs in the mouth or throat as needed for mouth pain. 05/31/19   Sid Greener, Zachery Dakins, FNP  ondansetron (ZOFRAN ODT) 4 MG disintegrating tablet  Take 1 tablet (4 mg total) by mouth every 8 (eight) hours as needed for nausea or vomiting. 11/20/18   Tommi Rumps, PA-C  pseudoephedrine (SUDAFED 12 HOUR) 120 MG 12 hr tablet Take 1 tablet (120 mg total) by mouth 2 (two) times daily. 04/08/18   Particia Nearing, PA-C  SUMAtriptan (IMITREX) 50 MG tablet Take one tab at first sign of migraine. May repeat in 2 hours if headache persists or recurs. 11/28/18   Particia Nearing, PA-C    Family History Family History  Adopted: Yes  Problem Relation Age of Onset  . Depression Mother   . Diabetes Father   . Diabetes Paternal Grandfather   . Breast cancer Neg Hx   . Ovarian cancer Neg Hx   . Colon cancer Neg Hx     Social History Social History   Tobacco Use  . Smoking status: Never Smoker  . Smokeless tobacco: Never Used   Substance Use Topics  . Alcohol use: No    Alcohol/week: 0.0 standard drinks  . Drug use: No     Allergies   Lisdexamfetamine and Sulfa antibiotics   Review of Systems Review of Systems  Constitutional: Negative.   HENT: Positive for dental problem.   Respiratory: Negative.   Cardiovascular: Negative.   All other systems reviewed and are negative.    Physical Exam Triage Vital Signs ED Triage Vitals [05/31/19 1501]  Enc Vitals Group     BP 123/90     Pulse Rate 83     Resp 17     Temp 97.6 F (36.4 C)     Temp Source Oral     SpO2 98 %     Weight      Height      Head Circumference      Peak Flow      Pain Score      Pain Loc      Pain Edu?      Excl. in GC?    No data found.  Updated Vital Signs BP 123/90 (BP Location: Right Arm)   Pulse 83   Temp 97.6 F (36.4 C) (Oral)   Resp 17   SpO2 98%   Visual Acuity Right Eye Distance:   Left Eye Distance:   Bilateral Distance:    Right Eye Near:   Left Eye Near:    Bilateral Near:     Physical Exam Vitals and nursing note reviewed.  Constitutional:      General: She is not in acute distress.    Appearance: Normal appearance. She is normal weight. She is not ill-appearing, toxic-appearing or diaphoretic.  HENT:     Nose: Nose normal. No congestion.     Mouth/Throat:     Lips: Pink.     Mouth: Mucous membranes are moist.     Dentition: Abnormal dentition. Dental tenderness present. No dental caries.     Pharynx: Oropharynx is clear. No oropharyngeal exudate.  Cardiovascular:     Rate and Rhythm: Normal rate and regular rhythm.     Pulses: Normal pulses.     Heart sounds: Normal heart sounds. No murmur. No gallop.   Pulmonary:     Effort: Pulmonary effort is normal. No respiratory distress.     Breath sounds: Normal breath sounds. No stridor. No wheezing, rhonchi or rales.  Chest:     Chest wall: No tenderness.  Neurological:     Mental Status: She is alert.      UC Treatments /  Results   Labs (all labs ordered are listed, but only abnormal results are displayed) Labs Reviewed - No data to display  EKG   Radiology No results found.  Procedures Procedures (including critical care time)  Medications Ordered in UC Medications - No data to display  Initial Impression / Assessment and Plan / UC Course  I have reviewed the triage vital signs and the nursing notes.  Pertinent labs & imaging results that were available during my care of the patient were reviewed by me and considered in my medical decision making (see chart for details).   Patient is stable at discharge. Was advised to continue to take Augmentin and Tylenol with codeine as prescribed Chlorhexidine mouthwash was prescribed Lidocaine mouthwash was prescribed for pain Follow-up with your dentist Return for worsening of symptoms   Final Clinical Impressions(s) / UC Diagnoses   Final diagnoses:  Pain, dental     Discharge Instructions     Viscous lidocaine prescribed.  Use as directed for pain relief Chlorhexidine mouthwash was prescribed Continue to take Tylenol with codeine as prescribed Take Augmentin as directed and to completion Recommend a soft diet  Maintain proper dental hygiene Follow up with dentist as soon as possible for further evaluation and treatment      ED Prescriptions    Medication Sig Dispense Auth. Provider   chlorhexidine (PERIDEX) 0.12 % solution Use as directed 15 mLs in the mouth or throat 3 (three) times daily after meals. 120 mL Deneise Getty S, FNP   lidocaine (XYLOCAINE) 2 % solution Use as directed 15 mLs in the mouth or throat as needed for mouth pain. 100 mL Emerson Monte, FNP     PDMP not reviewed this encounter.   Emerson Monte, [place] 05/31/19 [phone]

## 2019-06-10 IMAGING — US US ABDOMEN LIMITED
1 series · 15 of 25 positions shown · non-contrast
Comparison: None.

CLINICAL DATA: Acute right upper quadrant pain at 32 weeks
gestation.

EXAM:
ULTRASOUND ABDOMEN LIMITED RIGHT UPPER QUADRANT

[Series 1: us abdomen limited · 15 of 50 slices shown]
[im 1/50]
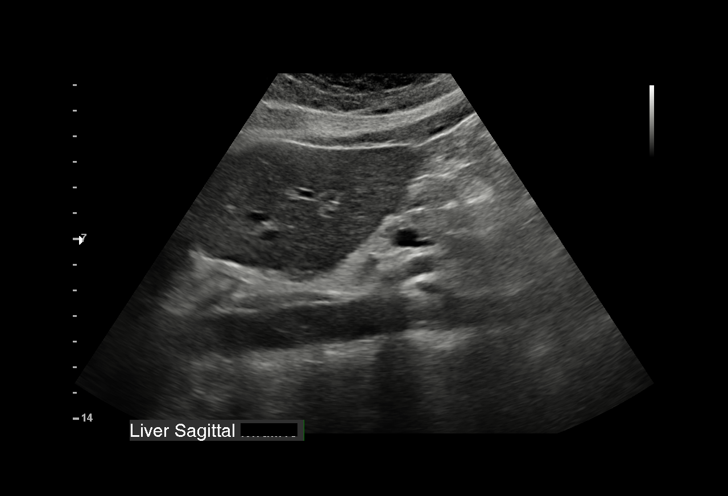
[im 5/50]
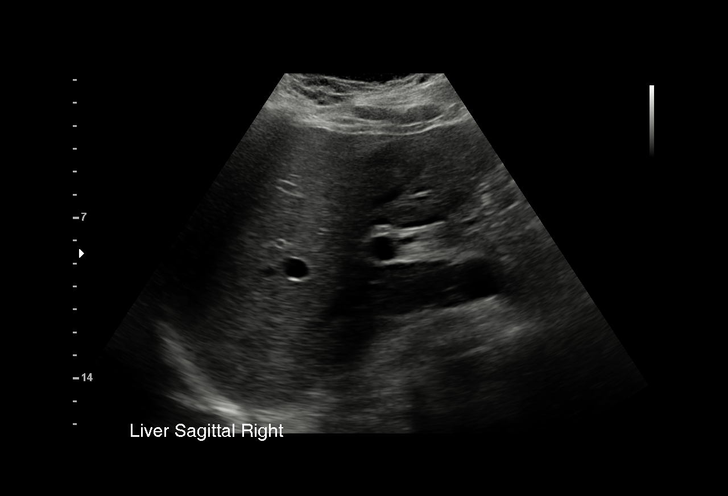
[im 9/50]
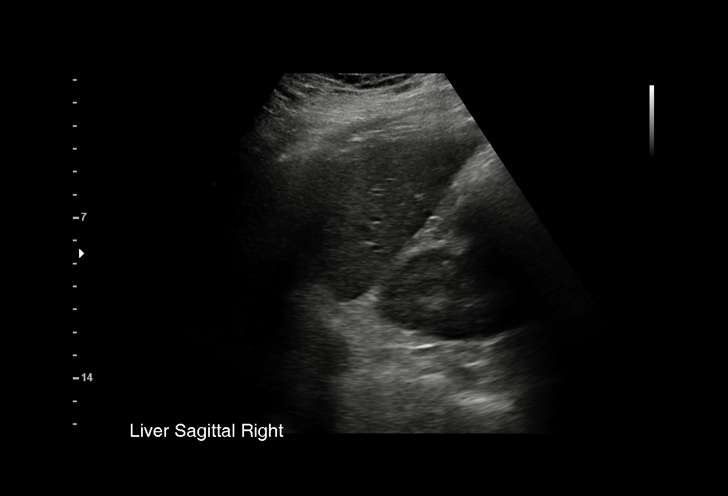
[im 11/50]
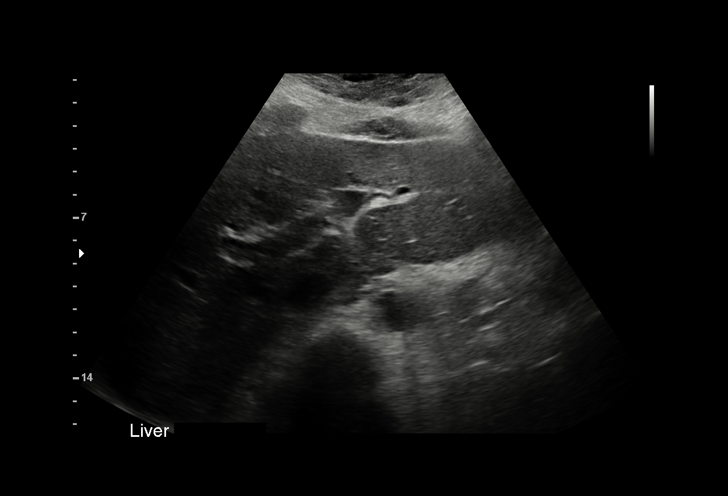
[im 15/50]
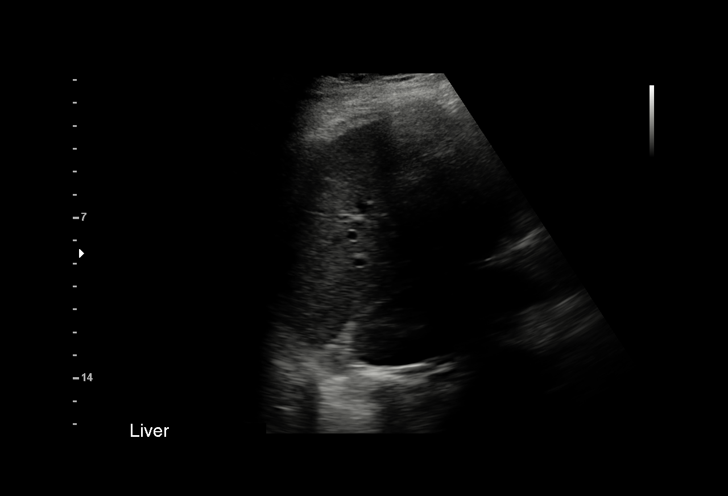
[im 19/50]
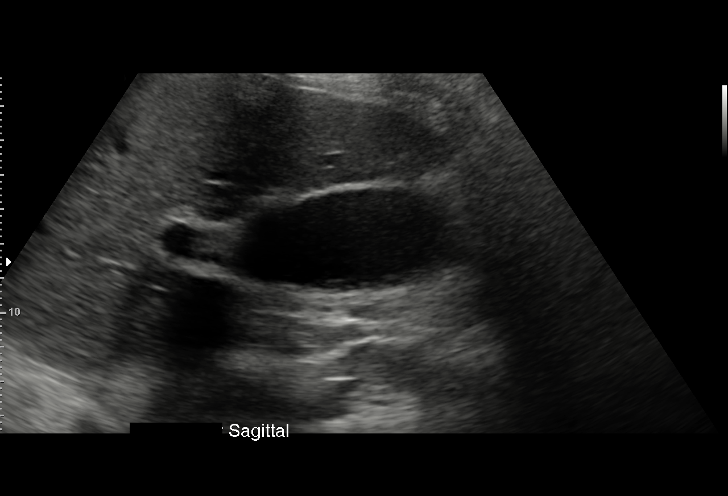
[im 21/50]
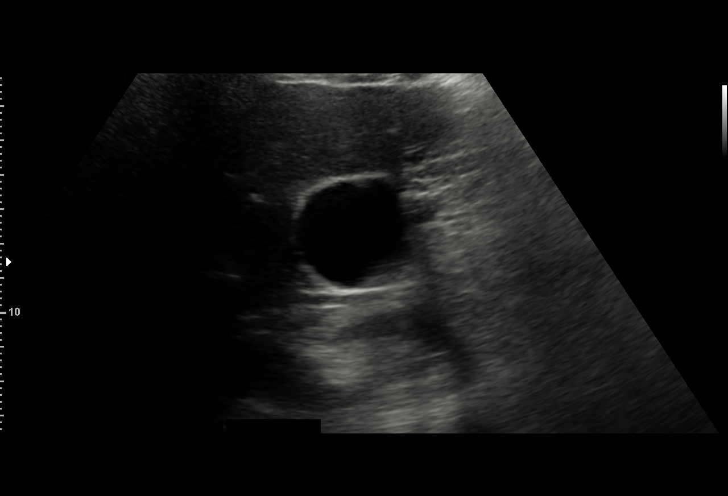
[im 25/50]
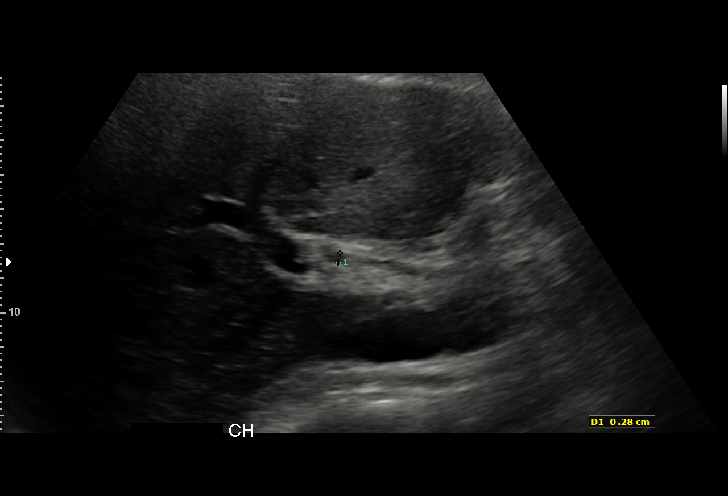
[im 29/50]
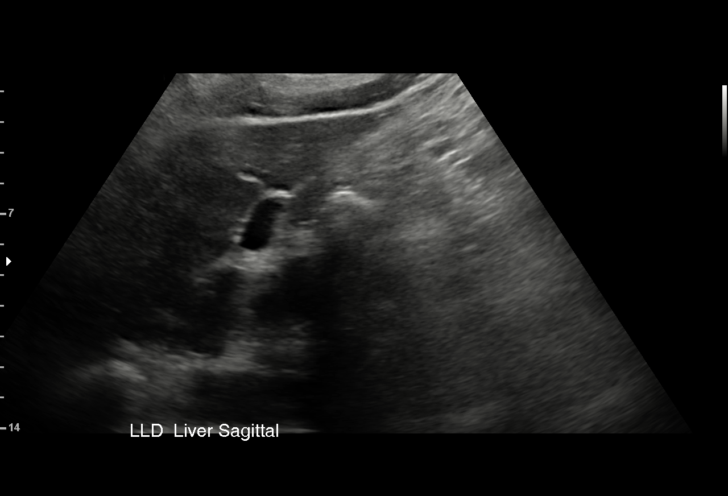
[im 31/50]
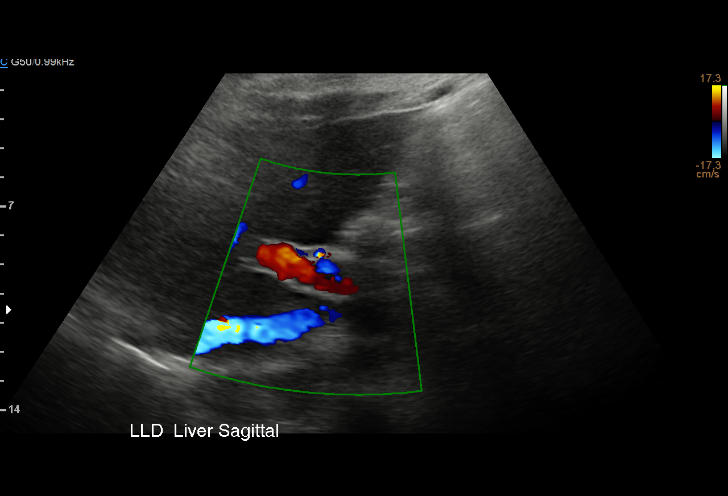
[im 35/50]
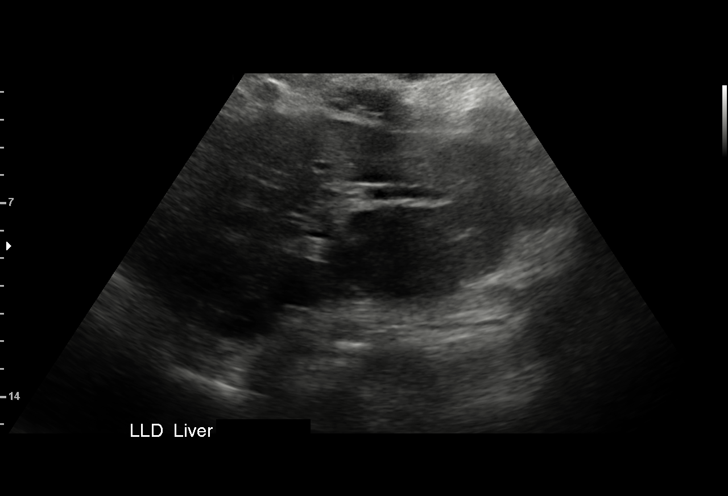
[im 39/50]
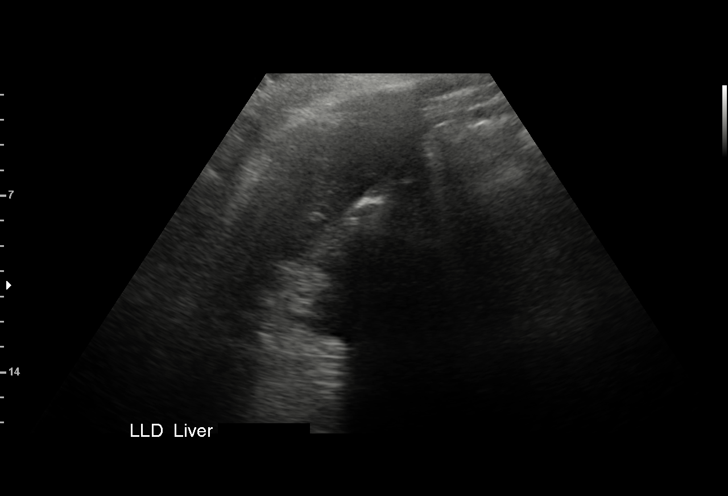
[im 41/50]
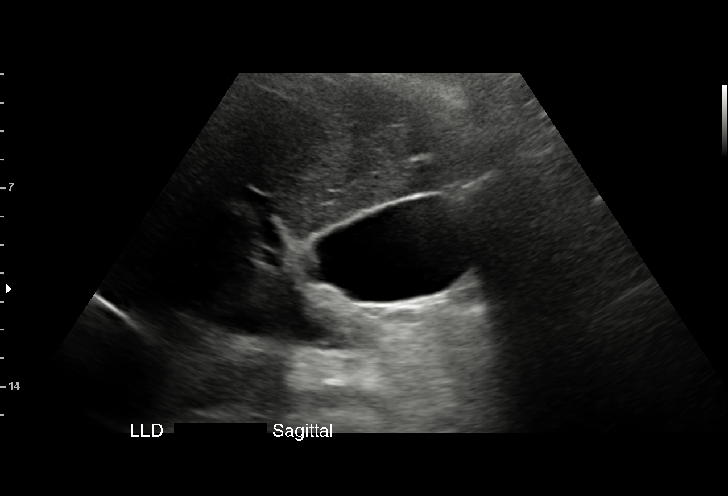
[im 45/50]
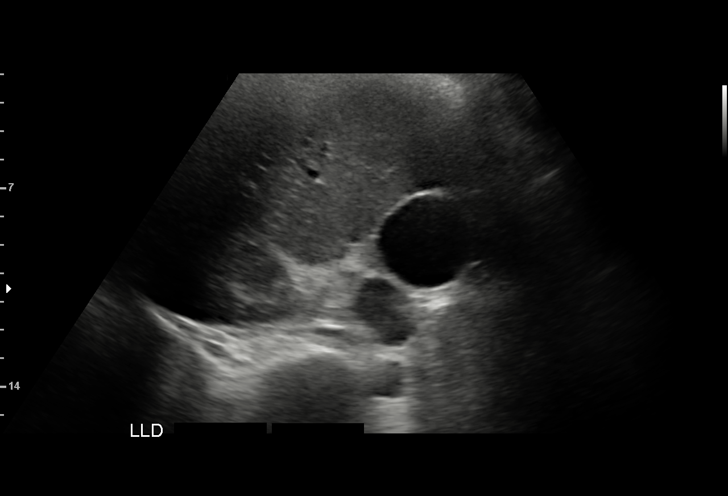
[im 50/50]
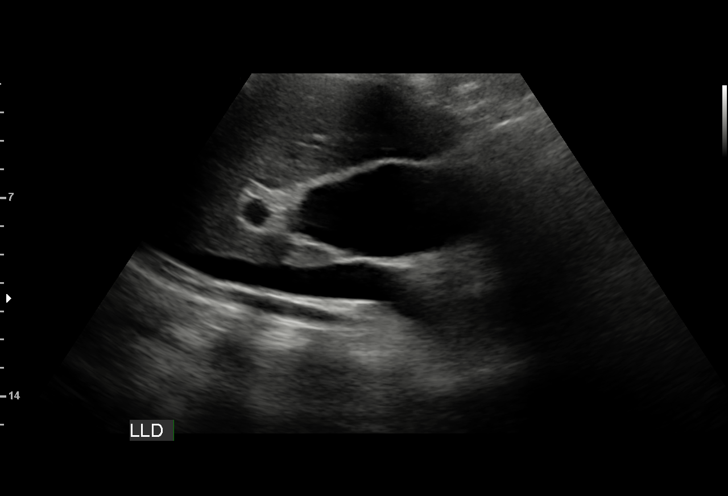

[15 of 25 positions shown; findings below may reference images not displayed]

FINDINGS: Gallbladder:

No gallstones or gallbladder wall thickening. No pericholecystic
fluid. The sonographer reports no sonographic Murphy's sign.

Common bile duct:

Diameter: 4 mm, within normal limits.

Liver:

No focal lesion identified. Within normal limits in parenchymal
echogenicity.
IMPRESSION: Normal right upper quadrant ultrasound.

## 2019-06-11 IMAGING — MR MR PELVIS W/O CM
4 of 7 series · 18 of 48 positions shown · non-contrast
Comparison: Ultrasound 09/06/2016

CLINICAL DATA: Right-sided abdominal pain, patient is 30+ weeks
pregnant

EXAM:
MRI ABDOMEN AND PELVIS WITHOUT CONTRAST
TECHNIQUE: Multiplanar multisequence MR imaging of the abdomen and pelvis was
performed. No intravenous contrast was administered.

[Series 5: T2 · coronal · 10.0mm · 0.90mm/px · 3 of 23 slices shown]
[im 1/23]
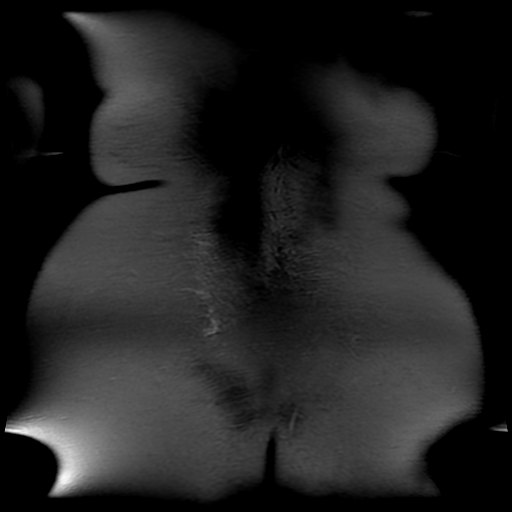
[im 12/23]
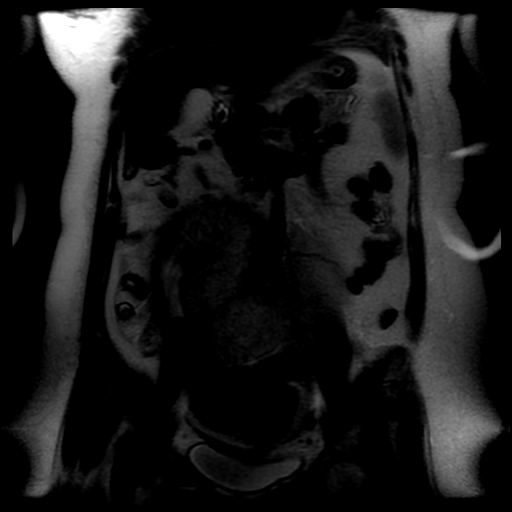
[im 23/23]
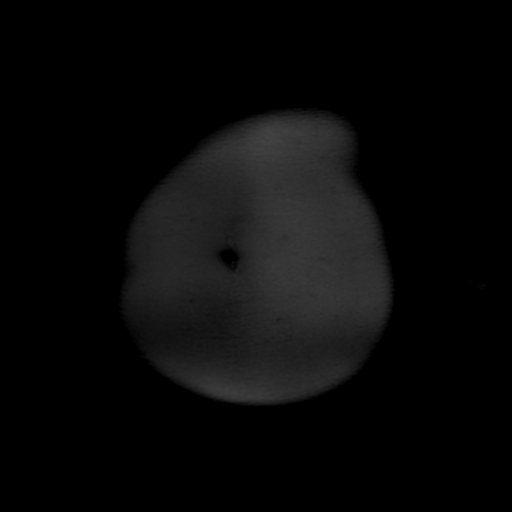

[Series 6: bSSFP · axial · 6.0mm · 0.86mm/px · z∈[-181,+288]mm · 8 of 68 slices shown (1 of 2)]
[im 1/68]
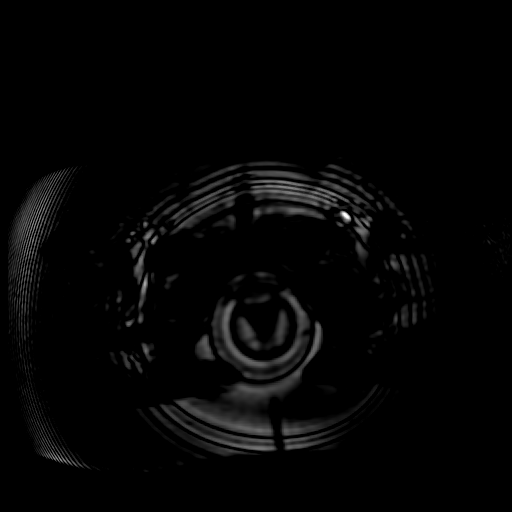
[im 10/68]
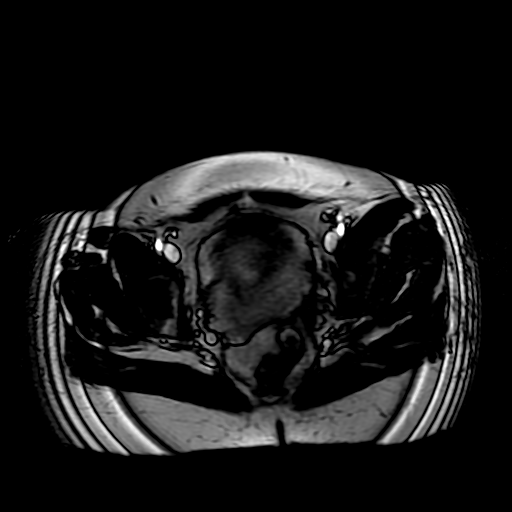
[im 20/68]
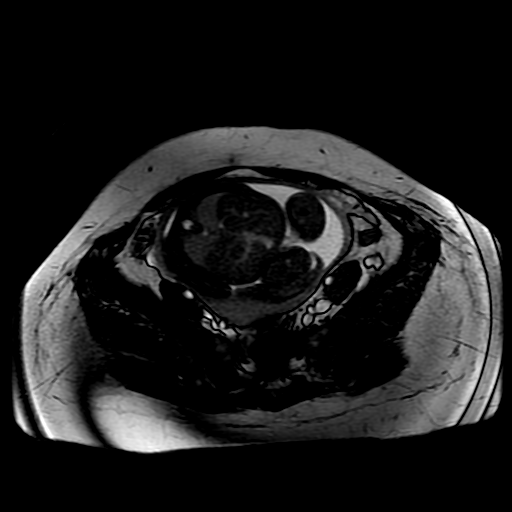
[im 29/68]
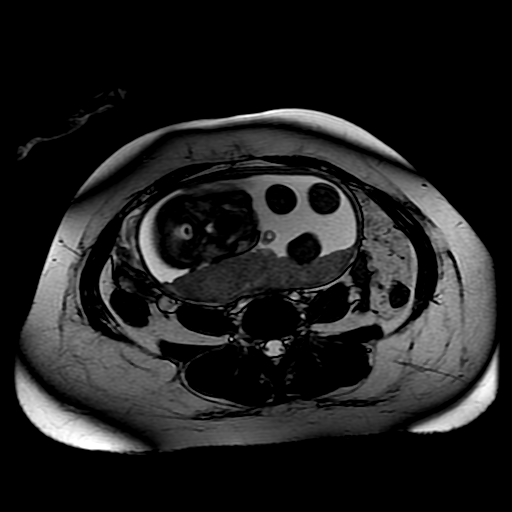
[im 39/68]
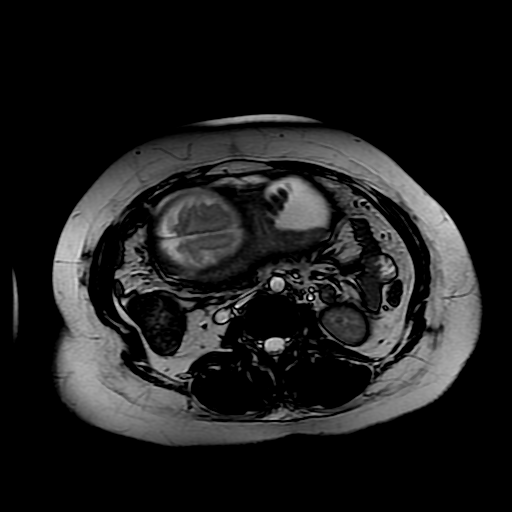
[im 48/68]
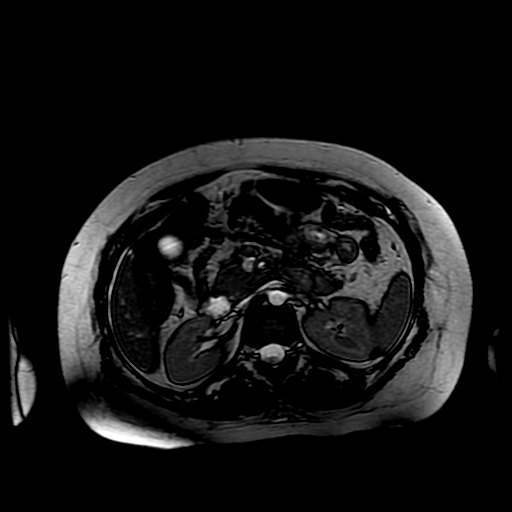
[im 58/68]
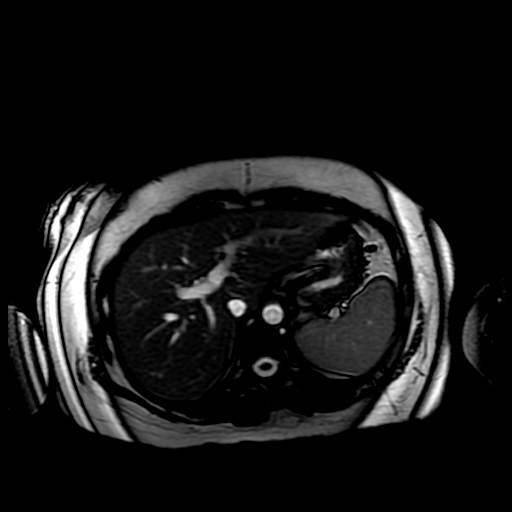
[im 68/68]
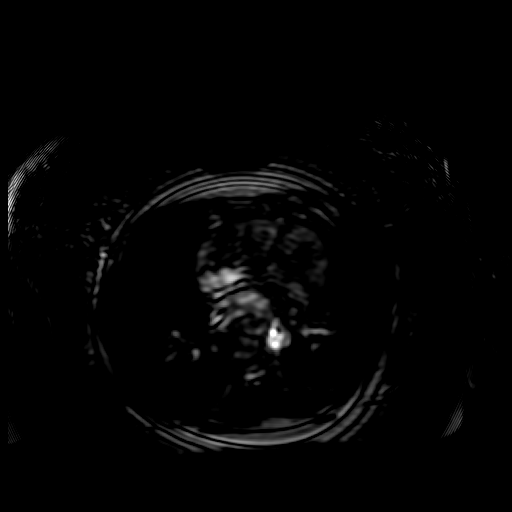

[Series 7: T2 fat-sat · axial · 10.0mm · 0.78mm/px · z∈[-182,+288]mm · 4 of 48 slices shown]
[im 1/48]
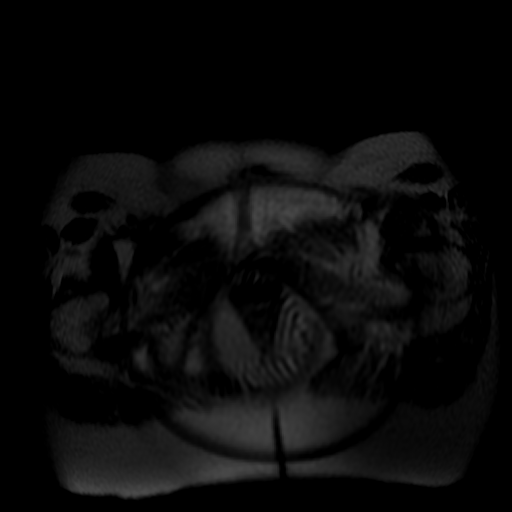
[im 10/48]
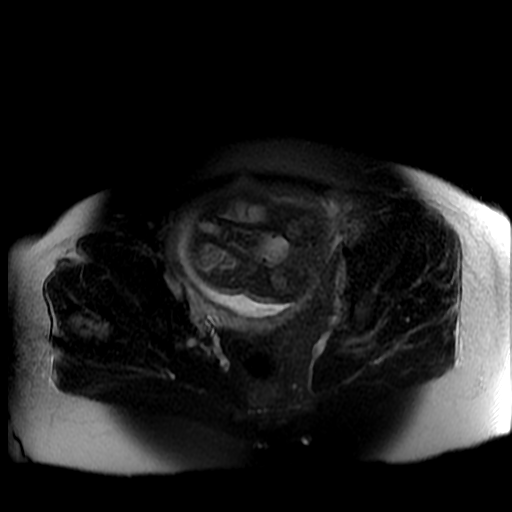
[im 29/48]
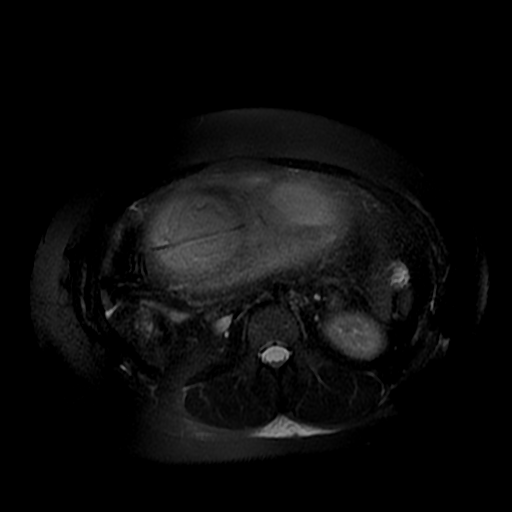
[im 48/48]
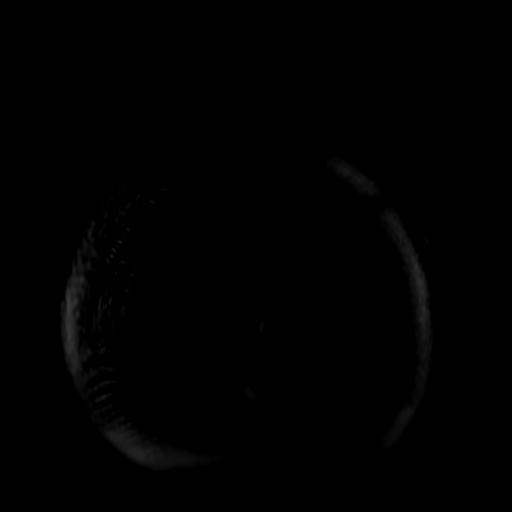

[Series 8: bSSFP · coronal · 10.0mm · 0.94mm/px · 3 of 23 slices shown (2 of 2)]
[im 1/23]
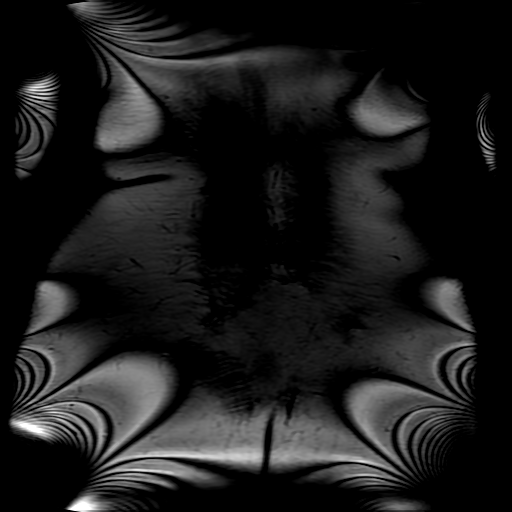
[im 12/23]
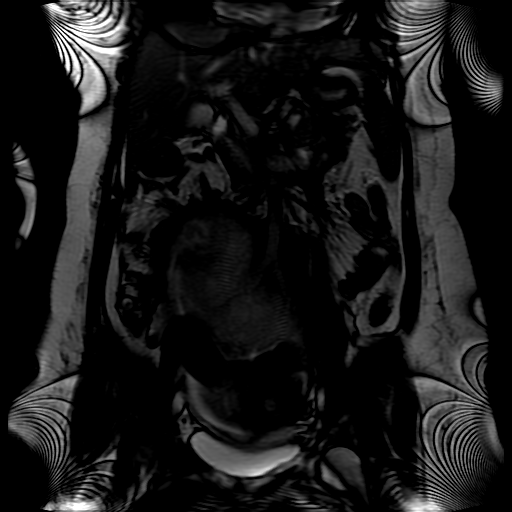
[im 23/23]
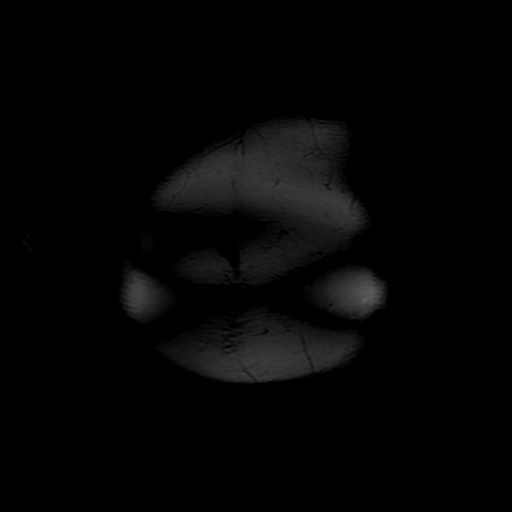

[18 of 48 positions shown; findings below may reference images not displayed]

FINDINGS: COMBINED FINDINGS FOR BOTH MR ABDOMEN AND PELVIS

Of note the examination was not tailored to evaluate the fetal
anatomy or provided detailed assessment of the placenta.

Lower chest: No acute findings.

Hepatobiliary: No mass or other parenchymal abnormality identified.
No stones in the gallbladder. No gallbladder wall thickening

Pancreas: No mass, inflammatory changes, or other parenchymal
abnormality identified.

Spleen:  Within normal limits in size and appearance.

Adrenals/Urinary Tract: No masses identified. No evidence of
hydronephrosis.

Stomach/Bowel: Visualized stomach within normal limits. No dilated
small bowel.

Blind-ending tubular structure in the right lower quadrant, series
12, image number 79 measuring up to 13 mm suspicious for dilated
appendix. Hypointense foci in the tip and proximal lumen may
represent appendicolith. Not much surrounding edema but there is a
small amount of free fluid in the right lower quadrant.

Vascular/Lymphatic: Non aneurysmal aorta. No significantly enlarged
lymph nodes

Reproductive: Single intrauterine gestation.  Posterior placenta.

Other:  None.

Musculoskeletal: No abnormal marrow signal
IMPRESSION: 1. Dilated blind-ending tubular structure in the right lower
quadrant measuring up to 13 mm suspicious for dilated appendix/acute
appendicitis. Although not much inflammation in the right lower
quadrant there is a small amount of free fluid. Hypointense foci in
the tip and proximal lumen may represent appendicolith.
Critical Value/emergent results were called by telephone at the time
of interpretation on 09/28/2016 at [DATE] to Dr. RATGAI DASS SVR , who
verbally acknowledged these results.

## 2020-01-26 ENCOUNTER — Other Ambulatory Visit: Payer: Self-pay

## 2020-01-26 ENCOUNTER — Ambulatory Visit (LOCAL_COMMUNITY_HEALTH_CENTER): Payer: Medicaid Other

## 2020-01-26 DIAGNOSIS — Z111 Encounter for screening for respiratory tuberculosis: Secondary | ICD-10-CM

## 2020-01-26 NOTE — Progress Notes (Signed)
Tuberculin administered and liquid leaked from injection site - bevel not seen during administration. No bleb noted. Tuberculin placed again with same result (bevel not seen as under skin). On third attempt, RN noticed liquid spread under skin when administered. No leakage and scant bleb noted. Jossie Ng, RN

## 2020-01-29 ENCOUNTER — Ambulatory Visit (LOCAL_COMMUNITY_HEALTH_CENTER): Payer: Medicaid Other

## 2020-01-29 ENCOUNTER — Other Ambulatory Visit: Payer: Self-pay

## 2020-01-29 DIAGNOSIS — Z111 Encounter for screening for respiratory tuberculosis: Secondary | ICD-10-CM

## 2020-01-29 LAB — TB SKIN TEST
Induration: 0 mm
TB Skin Test: NEGATIVE

## 2020-03-24 ENCOUNTER — Other Ambulatory Visit: Payer: Self-pay

## 2020-03-24 ENCOUNTER — Encounter: Payer: Self-pay | Admitting: Emergency Medicine

## 2020-03-24 ENCOUNTER — Emergency Department: Payer: Medicaid Other

## 2020-03-24 DIAGNOSIS — Y92410 Unspecified street and highway as the place of occurrence of the external cause: Secondary | ICD-10-CM | POA: Insufficient documentation

## 2020-03-24 DIAGNOSIS — Z3A32 32 weeks gestation of pregnancy: Secondary | ICD-10-CM | POA: Insufficient documentation

## 2020-03-24 DIAGNOSIS — O133 Gestational [pregnancy-induced] hypertension without significant proteinuria, third trimester: Secondary | ICD-10-CM | POA: Insufficient documentation

## 2020-03-24 DIAGNOSIS — O9A213 Injury, poisoning and certain other consequences of external causes complicating pregnancy, third trimester: Secondary | ICD-10-CM | POA: Diagnosis not present

## 2020-03-24 DIAGNOSIS — M25532 Pain in left wrist: Secondary | ICD-10-CM | POA: Insufficient documentation

## 2020-03-24 DIAGNOSIS — O26893 Other specified pregnancy related conditions, third trimester: Secondary | ICD-10-CM | POA: Insufficient documentation

## 2020-03-24 LAB — CBC WITH DIFFERENTIAL/PLATELET
Abs Immature Granulocytes: 0.02 10*3/uL (ref 0.00–0.07)
Basophils Absolute: 0 10*3/uL (ref 0.0–0.1)
Basophils Relative: 0 %
Eosinophils Absolute: 0 10*3/uL (ref 0.0–0.5)
Eosinophils Relative: 0 %
HCT: 35.3 % — ABNORMAL LOW (ref 36.0–46.0)
Hemoglobin: 11.6 g/dL — ABNORMAL LOW (ref 12.0–15.0)
Immature Granulocytes: 0 %
Lymphocytes Relative: 23 %
Lymphs Abs: 1.6 10*3/uL (ref 0.7–4.0)
MCH: 28.4 pg (ref 26.0–34.0)
MCHC: 32.9 g/dL (ref 30.0–36.0)
MCV: 86.3 fL (ref 80.0–100.0)
Monocytes Absolute: 0.6 10*3/uL (ref 0.1–1.0)
Monocytes Relative: 9 %
Neutro Abs: 4.8 10*3/uL (ref 1.7–7.7)
Neutrophils Relative %: 68 %
Platelets: 207 10*3/uL (ref 150–400)
RBC: 4.09 MIL/uL (ref 3.87–5.11)
RDW: 11.9 % (ref 11.5–15.5)
WBC: 7.2 10*3/uL (ref 4.0–10.5)
nRBC: 0 % (ref 0.0–0.2)

## 2020-03-24 LAB — COMPREHENSIVE METABOLIC PANEL
ALT: 15 U/L (ref 0–44)
AST: 16 U/L (ref 15–41)
Albumin: 3.1 g/dL — ABNORMAL LOW (ref 3.5–5.0)
Alkaline Phosphatase: 71 U/L (ref 38–126)
Anion gap: 10 (ref 5–15)
BUN: 19 mg/dL (ref 6–20)
CO2: 24 mmol/L (ref 22–32)
Calcium: 8.9 mg/dL (ref 8.9–10.3)
Chloride: 106 mmol/L (ref 98–111)
Creatinine, Ser: 0.8 mg/dL (ref 0.44–1.00)
GFR, Estimated: >60 mL/min (ref >60)
Glucose, Bld: 91 mg/dL (ref 70–99)
Potassium: 4.2 mmol/L (ref 3.5–5.1)
Sodium: 140 mmol/L (ref 135–145)
Total Bilirubin: 0.2 mg/dL — ABNORMAL LOW (ref 0.3–1.2)
Total Protein: 6.7 g/dL (ref 6.5–8.1)

## 2020-03-24 LAB — LIPASE, BLOOD: Lipase: 31 U/L (ref 11–51)
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
## 2020-03-24 NOTE — ED Notes (Signed)
Full rainbow labs sent

## 2020-03-25 ENCOUNTER — Observation Stay
Admission: AD | Admit: 2020-03-25 | Discharge: 2020-03-25 | Disposition: A | Discharge status: Home / Self Care | Payer: Medicaid Other | Source: Intra-hospital | Attending: Obstetrics and Gynecology | Admitting: Obstetrics and Gynecology

## 2020-03-25 ENCOUNTER — Emergency Department
Admission: EM | Admit: 2020-03-25 | Discharge: 2020-03-25 | Disposition: A | Discharge status: Home / Self Care | Payer: Medicaid Other | Source: Home / Self Care | Attending: Emergency Medicine | Admitting: Emergency Medicine

## 2020-03-25 ENCOUNTER — Encounter: Payer: Self-pay | Admitting: Obstetrics and Gynecology

## 2020-03-25 DIAGNOSIS — Z3A32 32 weeks gestation of pregnancy: Secondary | ICD-10-CM | POA: Insufficient documentation

## 2020-03-25 DIAGNOSIS — M25532 Pain in left wrist: Secondary | ICD-10-CM | POA: Insufficient documentation

## 2020-03-25 DIAGNOSIS — O9A213 Injury, poisoning and certain other consequences of external causes complicating pregnancy, third trimester: Principal | ICD-10-CM | POA: Insufficient documentation

## 2020-03-25 DIAGNOSIS — R1032 Left lower quadrant pain: Secondary | ICD-10-CM

## 2020-03-25 DIAGNOSIS — O26893 Other specified pregnancy related conditions, third trimester: Secondary | ICD-10-CM

## 2020-03-25 DIAGNOSIS — O133 Gestational [pregnancy-induced] hypertension without significant proteinuria, third trimester: Secondary | ICD-10-CM | POA: Insufficient documentation

## 2020-03-25 DIAGNOSIS — Y92410 Unspecified street and highway as the place of occurrence of the external cause: Secondary | ICD-10-CM | POA: Insufficient documentation

## 2020-03-25 MED ORDER — ACETAMINOPHEN 325 MG PO TABS
650.0000 mg | ORAL_TABLET | Freq: Once | ORAL | Status: AC | PRN
  Administered 2020-03-25: 650 mg via ORAL
  Filled 2020-03-25: qty 2

## 2020-03-25 MED ORDER — HYDROCODONE-ACETAMINOPHEN 5-325 MG PO TABS
1.0000 | ORAL_TABLET | Freq: Once | ORAL | Status: AC
  Administered 2020-03-25: 05:00:00 1 via ORAL
  Filled 2020-03-25: qty 1
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
## 2020-03-25 NOTE — ED Notes (Signed)
[facility] called to give report on patient. Per Dr Dolores Frame, patient needs to be placed on fetal monitoring and BP observation.
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
## 2020-03-25 NOTE — Discharge Instructions (Signed)
Wear splint as needed for comfort.  See your OB as soon as possible to recheck your blood pressures.  Return to the ER for worsening symptoms, persistent vomiting, vision changes, headache, abdominal pain/cramping, vaginal bleeding or other concerns.

## 2020-03-25 NOTE — ED Notes (Signed)
Wrist brace applied to L wrist. Patient tolerated well. Patient given instructions and states understanding.
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
## 2020-03-25 NOTE — Discharge Instructions (Signed)

## 2020-04-11 ENCOUNTER — Other Ambulatory Visit: Payer: Medicaid Other
